# Patient Record
Sex: Male | Born: 1960 | Race: White | Hispanic: No | Marital: Married | Smoking: Former smoker
Health system: Southern US, Community
[De-identification: ages and names within clinical notes are randomized; demographics above are authoritative.]

## PROBLEM LIST (undated history)

## (undated) DIAGNOSIS — I10 Essential (primary) hypertension: Secondary | ICD-10-CM

## (undated) DIAGNOSIS — F419 Anxiety disorder, unspecified: Secondary | ICD-10-CM

## (undated) DIAGNOSIS — K209 Esophagitis, unspecified without bleeding: Secondary | ICD-10-CM

## (undated) DIAGNOSIS — Z8679 Personal history of other diseases of the circulatory system: Secondary | ICD-10-CM

## (undated) DIAGNOSIS — M109 Gout, unspecified: Secondary | ICD-10-CM

## (undated) DIAGNOSIS — C50929 Malignant neoplasm of unspecified site of unspecified male breast: Secondary | ICD-10-CM

## (undated) DIAGNOSIS — I2510 Atherosclerotic heart disease of native coronary artery without angina pectoris: Secondary | ICD-10-CM

## (undated) DIAGNOSIS — I700 Atherosclerosis of aorta: Secondary | ICD-10-CM

## (undated) DIAGNOSIS — N522 Drug-induced erectile dysfunction: Secondary | ICD-10-CM

## (undated) HISTORY — DX: Gout, unspecified: M10.9

## (undated) HISTORY — DX: Esophagitis, unspecified without bleeding: K20.90

## (undated) HISTORY — DX: Essential (primary) hypertension: I10

## (undated) HISTORY — DX: Personal history of other diseases of the circulatory system: Z86.79

## (undated) HISTORY — PX: KNEE SURGERY: SHX244

## (undated) HISTORY — DX: Esophagitis, unspecified: K20.9

---

## 1995-02-19 HISTORY — PX: SPINE SURGERY: SHX786

## 1995-02-19 HISTORY — PX: BACK SURGERY: SHX140

## 2007-03-11 ENCOUNTER — Other Ambulatory Visit: Payer: Self-pay

## 2007-03-11 ENCOUNTER — Emergency Department: Payer: Self-pay | Admitting: Emergency Medicine

## 2007-03-16 ENCOUNTER — Other Ambulatory Visit: Payer: Self-pay

## 2007-03-16 ENCOUNTER — Inpatient Hospital Stay: Payer: Self-pay | Admitting: Internal Medicine

## 2009-06-16 ENCOUNTER — Emergency Department: Payer: Self-pay | Admitting: Unknown Physician Specialty

## 2012-02-19 HISTORY — PX: KNEE SURGERY: SHX244

## 2012-04-05 ENCOUNTER — Ambulatory Visit: Payer: Self-pay | Admitting: Family Medicine

## 2012-04-05 LAB — RAPID INFLUENZA A&B ANTIGENS

## 2012-05-01 ENCOUNTER — Ambulatory Visit: Payer: Self-pay | Admitting: Family Medicine

## 2012-05-08 ENCOUNTER — Ambulatory Visit: Payer: Self-pay | Admitting: Family Medicine

## 2014-10-09 ENCOUNTER — Other Ambulatory Visit: Payer: Self-pay | Admitting: Family Medicine

## 2014-10-19 ENCOUNTER — Other Ambulatory Visit: Payer: Self-pay | Admitting: Family Medicine

## 2014-11-08 ENCOUNTER — Other Ambulatory Visit: Payer: Self-pay | Admitting: Family Medicine

## 2014-11-15 ENCOUNTER — Other Ambulatory Visit: Payer: Self-pay | Admitting: Unknown Physician Specialty

## 2014-11-15 NOTE — Telephone Encounter (Signed)
Your patient.  Thanks 

## 2014-11-15 NOTE — Telephone Encounter (Signed)
Call for an apt plz

## 2014-11-18 ENCOUNTER — Other Ambulatory Visit: Payer: Self-pay | Admitting: Family Medicine

## 2014-11-22 ENCOUNTER — Other Ambulatory Visit: Payer: Self-pay | Admitting: Family Medicine

## 2015-01-03 ENCOUNTER — Other Ambulatory Visit: Payer: Self-pay | Admitting: Family Medicine

## 2015-03-05 ENCOUNTER — Other Ambulatory Visit: Payer: Self-pay | Admitting: Family Medicine

## 2015-03-15 ENCOUNTER — Telehealth: Payer: Self-pay | Admitting: Family Medicine

## 2015-03-15 MED ORDER — MELOXICAM 15 MG PO TABS: 15.0000 mg | ORAL_TABLET | Freq: Every day | ORAL | Status: DC

## 2015-03-15 MED ORDER — OMEPRAZOLE 20 MG PO CPDR
20.0000 mg | DELAYED_RELEASE_CAPSULE | Freq: Every day | ORAL | Status: DC
Start: 2015-03-15 — End: 2015-04-04

## 2015-03-15 NOTE — Telephone Encounter (Signed)
Pt called and made appt for 04/04/15 @ 1330.  Can we call in enough medication until that appt?  It's the soonest I could get him in with Dr Jeananne Rama.

## 2015-03-15 NOTE — Telephone Encounter (Signed)
Per his wife, He needs Omeprazole 20mg  Cap QD and Meloxicam 15mg  QD  I told her the Allopurinol should have refills, written in August with 7 refills [facility]

## 2015-03-15 NOTE — Telephone Encounter (Signed)
What does he need?

## 2015-03-15 NOTE — Telephone Encounter (Signed)
Rxs sent to his pharmacy.  

## 2015-03-31 DIAGNOSIS — I10 Essential (primary) hypertension: Secondary | ICD-10-CM | POA: Insufficient documentation

## 2015-04-04 ENCOUNTER — Encounter: Payer: Self-pay | Admitting: Family Medicine

## 2015-04-04 ENCOUNTER — Ambulatory Visit (INDEPENDENT_AMBULATORY_CARE_PROVIDER_SITE_OTHER): Payer: BLUE CROSS/BLUE SHIELD | Admitting: Family Medicine

## 2015-04-04 VITALS — BP 123/75 | HR 68 | Temp 98.2°F | Ht 71.6 in | Wt 232.0 lb

## 2015-04-04 DIAGNOSIS — M109 Gout, unspecified: Secondary | ICD-10-CM | POA: Insufficient documentation

## 2015-04-04 DIAGNOSIS — R0689 Other abnormalities of breathing: Secondary | ICD-10-CM | POA: Diagnosis not present

## 2015-04-04 DIAGNOSIS — M1000 Idiopathic gout, unspecified site: Secondary | ICD-10-CM

## 2015-04-04 DIAGNOSIS — I10 Essential (primary) hypertension: Secondary | ICD-10-CM | POA: Diagnosis not present

## 2015-04-04 MED ORDER — ALLOPURINOL 300 MG PO TABS: ORAL_TABLET | ORAL | Status: DC

## 2015-04-04 MED ORDER — OMEPRAZOLE 20 MG PO CPDR: 20.0000 mg | DELAYED_RELEASE_CAPSULE | Freq: Every day | ORAL | Status: DC

## 2015-04-04 MED ORDER — MELOXICAM 15 MG PO TABS: 15.0000 mg | ORAL_TABLET | Freq: Every day | ORAL | Status: DC

## 2015-04-04 MED ORDER — TRIAMCINOLONE ACETONIDE 55 MCG/ACT NA AERO: 2.0000 | INHALATION_SPRAY | Freq: Every day | NASAL | Status: DC

## 2015-04-04 MED ORDER — LISINOPRIL 20 MG PO TABS: 20.0000 mg | ORAL_TABLET | Freq: Every day | ORAL | Status: DC

## 2015-04-04 NOTE — Progress Notes (Signed)
   BP 123/75 mmHg  Pulse 68  Temp(Src) 98.2 F (36.8 C)  Ht 5' 11.6" (1.819 m)  Wt 232 lb (105.235 kg)  BMI 31.80 kg/m2  SpO2 95%   Subjective:    Patient ID: Kurt Mendoza, male    DOB: 1960-07-13, 55 y.o.   MRN: [id number]  HPI: Kurt Mendoza is a 55 y.o. male  Chief Complaint  Patient presents with  . Hypertension  . medication refills   patient doing well with good blood pressure no complaints taking lisinopril without problems and faithfully No gout symptoms takes allopurinol without problems Takes meloxicam every day Takes omeprazole every day for reflux which is stable  Also nose gets stopped up especially laying on his right side and can't breathe and has limited air passage on the right side  Relevant past medical, surgical, family and social history reviewed and updated as indicated. Interim medical history since our last visit reviewed. Allergies and medications reviewed and updated.  Review of Systems  Constitutional: Negative.   Respiratory: Negative.   Cardiovascular: Negative.     Per HPI unless specifically indicated above     Objective:    BP 123/75 mmHg  Pulse 68  Temp(Src) 98.2 F (36.8 C)  Ht 5' 11.6" (1.819 m)  Wt 232 lb (105.235 kg)  BMI 31.80 kg/m2  SpO2 95%  Wt Readings from Last 3 Encounters:  04/04/15 232 lb (105.235 kg)  06/09/14 234 lb (106.142 kg)    Physical Exam  Constitutional: He is oriented to person, place, and time. He appears well-developed and well-nourished. No distress.  HENT:  Head: Normocephalic and atraumatic.  Right Ear: Hearing normal.  Left Ear: Hearing normal.  Nose: Nose normal.  No obvious lesion and nose  Eyes: Conjunctivae and lids are normal. Right eye exhibits no discharge. Left eye exhibits no discharge. No scleral icterus.  Cardiovascular: Normal rate, regular rhythm and normal heart sounds.   Pulmonary/Chest: Effort normal and breath sounds normal. No respiratory distress.  Musculoskeletal: Normal  range of motion.  Neurological: He is alert and oriented to person, place, and time.  Skin: Skin is intact. No rash noted.  Psychiatric: He has a normal mood and affect. His speech is normal and behavior is normal. Judgment and thought content normal. Cognition and memory are normal.        Assessment & Plan:   Problem List Items Addressed This Visit      Cardiovascular and Mediastinum   Hypertension - Primary    The current medical regimen is effective;  continue present plan and medications.       Relevant Medications   lisinopril (PRINIVIL,ZESTRIL) 20 MG tablet   Other Relevant Orders   Basic metabolic panel     Other   Gout    Stable gout on allopurinol Discussed holding meloxicam from time to time      Relevant Orders   Uric acid   Difficulty breathing    Will try Nasacort if no relief refer to ear nose and throat to further evaluate symptoms may be caused by nasal polyp.          Follow up plan: Return in about 3 months (around 07/02/2015) for Physical Exam.

## 2015-04-04 NOTE — Assessment & Plan Note (Signed)
Will try Nasacort if no relief refer to ear nose and throat to further evaluate symptoms may be caused by nasal polyp.

## 2015-04-04 NOTE — Assessment & Plan Note (Addendum)
Stable gout on allopurinol Discussed holding meloxicam from time to time

## 2015-04-04 NOTE — Assessment & Plan Note (Signed)
The current medical regimen is effective;  continue present plan and medications.  

## 2015-04-05 ENCOUNTER — Encounter: Payer: Self-pay | Admitting: Family Medicine

## 2015-04-05 LAB — URIC ACID: URIC ACID: 5.3 mg/dL (ref 3.7–8.6)

## 2015-04-05 LAB — BASIC METABOLIC PANEL
BUN / CREAT RATIO: 26 — AB (ref 9–20)
BUN: 20 mg/dL (ref 6–24)
CO2: 23 mmol/L (ref 18–29)
CREATININE: 0.76 mg/dL (ref 0.76–1.27)
Calcium: 9.4 mg/dL (ref 8.7–10.2)
Chloride: 102 mmol/L (ref 96–106)
GFR calc Af Amer: 119 mL/min/{1.73_m2} (ref >59)
GFR calc non Af Amer: 103 mL/min/{1.73_m2} (ref >59)
GLUCOSE: 86 mg/dL (ref 65–99)
Potassium: 4.3 mmol/L (ref 3.5–5.2)
Sodium: 143 mmol/L (ref 134–144)

## 2015-04-18 ENCOUNTER — Telehealth: Payer: Self-pay | Admitting: Family Medicine

## 2015-04-18 MED ORDER — OSELTAMIVIR PHOSPHATE 75 MG PO CAPS: 75.0000 mg | ORAL_CAPSULE | Freq: Every day | ORAL | Status: DC

## 2015-04-18 NOTE — Telephone Encounter (Signed)
Wife tested positive for influenza B. Would like prophylactic tamiflu sent to the pharmacy. Rx sent to the pharmacy.

## 2015-05-15 ENCOUNTER — Ambulatory Visit (INDEPENDENT_AMBULATORY_CARE_PROVIDER_SITE_OTHER): Payer: BLUE CROSS/BLUE SHIELD | Admitting: Family Medicine

## 2015-05-15 ENCOUNTER — Encounter: Payer: Self-pay | Admitting: Family Medicine

## 2015-05-15 VITALS — BP 132/85 | HR 74 | Temp 97.8°F | Ht 71.7 in | Wt 220.0 lb

## 2015-05-15 DIAGNOSIS — R55 Syncope and collapse: Secondary | ICD-10-CM

## 2015-05-15 NOTE — Progress Notes (Signed)
   BP 132/85 mmHg  Pulse 74  Temp(Src) 97.8 F (36.6 C)  Ht 5' 11.7" (1.821 m)  Wt 220 lb (99.791 kg)  BMI 30.09 kg/m2  SpO2 96%   Subjective:    Patient ID: Kurt Mendoza, male    DOB: 02-16-1961, 55 y.o.   MRN: [id number]  HPI: Kurt Mendoza is a 55 y.o. male  Chief Complaint  Patient presents with  . light headed at work last week    needs note to go back to work   Patient with near syncopal spell with full workup done at [facility] emergency room Patient recovered and feels back to normal ready to go to work tomorrow Reviewed ER notes and labs which were normal. Relevant past medical, surgical, family and social history reviewed and updated as indicated. Interim medical history since our last visit reviewed. Allergies and medications reviewed and updated.  Review of Systems  Constitutional: Negative.   Respiratory: Negative.   Cardiovascular: Negative.     Per HPI unless specifically indicated above     Objective:    BP 132/85 mmHg  Pulse 74  Temp(Src) 97.8 F (36.6 C)  Ht 5' 11.7" (1.821 m)  Wt 220 lb (99.791 kg)  BMI 30.09 kg/m2  SpO2 96%  Wt Readings from Last 3 Encounters:  05/15/15 220 lb (99.791 kg)  04/04/15 232 lb (105.235 kg)  06/09/14 234 lb (106.142 kg)    Physical Exam  Constitutional: He is oriented to person, place, and time. He appears well-developed and well-nourished. No distress.  HENT:  Head: Normocephalic and atraumatic.  Right Ear: Hearing normal.  Left Ear: Hearing normal.  Nose: Nose normal.  Eyes: Conjunctivae and lids are normal. Right eye exhibits no discharge. Left eye exhibits no discharge. No scleral icterus.  Cardiovascular: Normal rate, regular rhythm and normal heart sounds.   Pulmonary/Chest: Effort normal and breath sounds normal. No respiratory distress.  Musculoskeletal: Normal range of motion.  Neurological: He is alert and oriented to person, place, and time.  Skin: Skin is intact. No rash noted.  Psychiatric: He has a  normal mood and affect. His speech is normal and behavior is normal. Judgment and thought content normal. Cognition and memory are normal.    Results for orders placed or performed in visit on [id number]  Basic metabolic panel  Result Value Ref Range   Glucose 86 65 - 99 mg/dL   BUN 20 6 - 24 mg/dL   Creatinine, Ser 0.76 0.76 - 1.27 mg/dL   GFR calc non Af Amer 103 >59 mL/min/1.73   GFR calc Af Amer 119 >59 mL/min/1.73   BUN/Creatinine Ratio 26 (H) 9 - 20   Sodium 143 134 - 144 mmol/L   Potassium 4.3 3.5 - 5.2 mmol/L   Chloride 102 96 - 106 mmol/L   CO2 23 18 - 29 mmol/L   Calcium 9.4 8.7 - 10.2 mg/dL  Uric acid  Result Value Ref Range   Uric Acid 5.3 3.7 - 8.6 mg/dL      Assessment & Plan:   Problem List Items Addressed This Visit    None    Visit Diagnoses    Near syncope    -  Primary    Discuss near-syncope hydration food and self-care status for prevention        Follow up plan: Return for As scheduled.

## 2015-07-04 ENCOUNTER — Encounter: Payer: Self-pay | Admitting: Family Medicine

## 2015-07-31 ENCOUNTER — Emergency Department
Admission: EM | Admit: 2015-07-31 | Discharge: 2015-07-31 | Disposition: A | Discharge status: Home / Self Care | Payer: Self-pay | Attending: Emergency Medicine | Admitting: Emergency Medicine

## 2015-07-31 ENCOUNTER — Emergency Department: Payer: Self-pay

## 2015-07-31 ENCOUNTER — Encounter: Payer: Self-pay | Admitting: Emergency Medicine

## 2015-07-31 ENCOUNTER — Other Ambulatory Visit: Payer: Self-pay

## 2015-07-31 DIAGNOSIS — I10 Essential (primary) hypertension: Secondary | ICD-10-CM | POA: Insufficient documentation

## 2015-07-31 DIAGNOSIS — R0789 Other chest pain: Secondary | ICD-10-CM | POA: Insufficient documentation

## 2015-07-31 DIAGNOSIS — Z791 Long term (current) use of non-steroidal anti-inflammatories (NSAID): Secondary | ICD-10-CM | POA: Insufficient documentation

## 2015-07-31 DIAGNOSIS — Z79899 Other long term (current) drug therapy: Secondary | ICD-10-CM | POA: Insufficient documentation

## 2015-07-31 LAB — BASIC METABOLIC PANEL
Anion gap: 10 (ref 5–15)
BUN: 16 mg/dL (ref 6–20)
CO2: 22 mmol/L (ref 22–32)
CREATININE: 0.68 mg/dL (ref 0.61–1.24)
Calcium: 9.3 mg/dL (ref 8.9–10.3)
Chloride: 105 mmol/L (ref 101–111)
GFR calc Af Amer: >60 mL/min (ref >60)
Glucose, Bld: 107 mg/dL — ABNORMAL HIGH (ref 65–99)
POTASSIUM: 3.7 mmol/L (ref 3.5–5.1)
SODIUM: 137 mmol/L (ref 135–145)

## 2015-07-31 LAB — CBC
HEMATOCRIT: 46.1 % (ref 40.0–52.0)
Hemoglobin: 16 g/dL (ref 13.0–18.0)
MCH: 31.3 pg (ref 26.0–34.0)
MCHC: 34.7 g/dL (ref 32.0–36.0)
MCV: 90.3 fL (ref 80.0–100.0)
PLATELETS: 186 10*3/uL (ref 150–440)
RBC: 5.11 MIL/uL (ref 4.40–5.90)
RDW: 12.2 % (ref 11.5–14.5)
WBC: 7.1 10*3/uL (ref 3.8–10.6)

## 2015-07-31 LAB — TROPONIN I: Troponin I: <0.03 ng/mL (ref <0.031)

## 2015-07-31 NOTE — Discharge Instructions (Signed)

## 2015-07-31 NOTE — ED Notes (Signed)
Says tightness/pressure low chest at 9am started at rest.  Nausea and numbness in arms .

## 2015-07-31 NOTE — ED Provider Notes (Signed)
[facility] Emergency Department Provider Note  ____________________________________________  Time seen: Approximately 4:49 PM  I have reviewed the triage vital signs and the nursing notes.   HISTORY  Chief Complaint Chest Pain; Nausea; and Numbness    HPI Kurt Mendoza is a 55 y.o. male with a past medical history of hypertension and esophagitis who presents for evaluation of several days of general malaise and lightheadedness and gradual onset of mild central chest tightness and pressure in the lower part of his chest/epigastrium.  This started about 9 AM today and has completely resolved.  He does have issues with acid reflux and takes a medication "about every other day" he cannot remember which one.  He denies fever/chills, shortness of breath, sharp chest pain, abdominal pain, dysuria.  Nothing in particularmakes it better nor worse.  He has no close family members that have ever had problems with her heart, he does not have diabetes he smokes occasional cigars but no cigarettes, and he has no personal history of acute coronary syndrome/CAD.  He had an ultrasound of his right upper quadrant years ago and they told him he has fatty liver disease but he had a normal gallbladder at the time.   Past Medical History  Diagnosis Date  . Hypertension   . Gout   . Esophagitis   . History of irregular heartbeat     Patient Active Problem List   Diagnosis Date Noted  . Difficulty breathing 04/04/2015  . Gout 04/04/2015  . Hypertension     Past Surgical History  Procedure Laterality Date  . Spine surgery  1997    Current Outpatient Rx  Name  Route  Sig  Dispense  Refill  . allopurinol (ZYLOPRIM) 300 MG tablet      TAKE 1 TABLET BY MOUTH DAILY   30 tablet   7   . colchicine 0.6 MG tablet      TAKE 2 TABLETS BY MOUTH NOW THEN 1 TABLET 1 HOUR LATER THEN 1 TWICE DAILY FOR GOUT ATTACK   60 tablet   0   . lisinopril (PRINIVIL,ZESTRIL) 20 MG  tablet   Oral   Take 1 tablet (20 mg total) by mouth daily.   30 tablet   7   . meloxicam (MOBIC) 15 MG tablet   Oral   Take 1 tablet (15 mg total) by mouth daily.   30 tablet   7   . omeprazole (PRILOSEC) 20 MG capsule   Oral   Take 1 capsule (20 mg total) by mouth daily.   30 capsule   7   . triamcinolone (NASACORT) 55 MCG/ACT AERO nasal inhaler   Nasal   Place 2 sprays into the nose daily.   1 Inhaler   12     Allergies Amlodipine and Morphine and related  Family History  Problem Relation Age of Onset  . Hyperlipidemia Mother   . Stroke Father   . Hyperlipidemia Father   . Diabetes Father   . Heart disease Father   . Thyroid disease Sister   . Thyroid disease Brother     Social History Social History  Substance Use Topics  . Smoking status: Never Smoker   . Smokeless tobacco: Never Used  . Alcohol Use: Yes    Review of Systems Constitutional: No fever/chills Eyes: No visual changes. ENT: No sore throat. Cardiovascular: Denies chest pain.  Mild chest pressure, resolved. Respiratory: Denies shortness of breath. Gastrointestinal: No abdominal pain.  No nausea, no vomiting.  No diarrhea.  No constipation. Genitourinary: Negative for dysuria. Musculoskeletal: Negative for back pain. Skin: Negative for rash. Neurological: Negative for headaches, focal weakness or numbness.  10-point ROS otherwise negative.  ____________________________________________   PHYSICAL EXAM:  VITAL SIGNS: ED Triage Vitals  Enc Vitals Group     BP 07/31/15 1232 138/90 mmHg     Pulse Rate 07/31/15 1232 80     Resp 07/31/15 1232 16     Temp 07/31/15 1232 97.6 F (36.4 C)     Temp Source 07/31/15 1232 Oral     SpO2 07/31/15 1232 96 %     Weight 07/31/15 1232 225 lb (102.059 kg)     Height 07/31/15 1232 6\' 1"  (1.854 m)     Head Cir --      Peak Flow --      Pain Score 07/31/15 1233 5     Pain Loc --      Pain Edu? --      Excl. in [place]? --     Constitutional:  Alert and oriented. Well appearing and in no acute distress. Eyes: Conjunctivae are normal. PERRL. EOMI. Head: Atraumatic. Nose: No congestion/rhinnorhea. Mouth/Throat: Mucous membranes are moist.  Oropharynx non-erythematous. Neck: No stridor.  No meningeal signs.   Cardiovascular: Normal rate, regular rhythm. Good peripheral circulation. Grossly normal heart sounds.   Respiratory: Normal respiratory effort.  No retractions. Lungs CTAB. Gastrointestinal: Soft and nontender. No distention.  Musculoskeletal: No lower extremity tenderness nor edema. No gross deformities of extremities. Neurologic:  Normal speech and language. No gross focal neurologic deficits are appreciated.  Skin:  Skin is warm, dry and intact. No rash noted. Psychiatric: Mood and affect are normal. Speech and behavior are normal.  ____________________________________________   LABS (all labs ordered are listed, but only abnormal results are displayed)  Labs Reviewed  BASIC METABOLIC PANEL - Abnormal; Notable for the following:    Glucose, Bld 107 (*)    All other components within normal limits  CBC  TROPONIN I   ____________________________________________  EKG  ED ECG REPORT I, Ritu Gagliardo, the attending physician, personally viewed and interpreted this ECG.  Date: 07/31/2015 EKG Time: 12:21 Rate: 75 Rhythm: normal sinus rhythm QRS Axis: normal Intervals: normal.  LVH ST/T Wave abnormalities: normal Conduction Disturbances: none Narrative Interpretation: unremarkable  ____________________________________________  RADIOLOGY   Dg Chest 2 View  07/31/2015  CLINICAL DATA:  Low chest tightness and pressure starting at 9 a.m. today. Nausea and numbness arms. EXAM: CHEST  2 VIEW COMPARISON:  03/16/2007 FINDINGS: The lungs are clear wiithout focal pneumonia, edema, pneumothorax or pleural effusion. The cardiopericardial silhouette is within normal limits for size. The visualized bony structures of the  thorax are intact. IMPRESSION: No active cardiopulmonary disease. Electronically Signed   By: [name] M.D.   On: 07/31/2015 12:58    ____________________________________________   PROCEDURES  Procedure(s) performed: None  Critical Care performed: No ____________________________________________   INITIAL IMPRESSION / ASSESSMENT AND PLAN / ED COURSE  Pertinent labs & imaging results that were available during my care of the patient were reviewed by me and considered in my medical decision making (see chart for details).  HEART score of 2 (low risk). PERC negative.  The patient does not want to stay for further evaluation because he has been waiting in the emergency department for more than 4-1/2 hours.  Given that he is asymptomatic and low risk, I think this is appropriate.  I gave him my usual and customary low risk  chest pain instructions and return precautions and he will follow-up with his primary care doctor.  He is declining a full dose aspirin in the emergency department today.   ____________________________________________  FINAL CLINICAL IMPRESSION(S) / ED DIAGNOSES  Final diagnoses:  Atypical chest pain     MEDICATIONS GIVEN DURING THIS VISIT:  Medications - No data to display   NEW OUTPATIENT MEDICATIONS STARTED DURING THIS VISIT:  New Prescriptions   No medications on file      Note:  This document was prepared using Dragon voice recognition software and may include unintentional dictation errors.   Hinda Kehr, MD 07/31/15 1705

## 2015-11-17 ENCOUNTER — Encounter (INDEPENDENT_AMBULATORY_CARE_PROVIDER_SITE_OTHER): Payer: Self-pay

## 2015-12-13 ENCOUNTER — Encounter: Payer: Self-pay | Admitting: Family Medicine

## 2016-02-07 ENCOUNTER — Encounter: Payer: Self-pay | Admitting: Family Medicine

## 2016-02-07 ENCOUNTER — Ambulatory Visit (INDEPENDENT_AMBULATORY_CARE_PROVIDER_SITE_OTHER): Payer: Self-pay | Admitting: Family Medicine

## 2016-02-07 ENCOUNTER — Ambulatory Visit
Admission: RE | Admit: 2016-02-07 | Discharge: 2016-02-07 | Disposition: A | Discharge status: Home / Self Care | Payer: Self-pay | Source: Ambulatory Visit | Attending: Family Medicine | Admitting: Family Medicine

## 2016-02-07 ENCOUNTER — Telehealth: Payer: Self-pay | Admitting: Family Medicine

## 2016-02-07 VITALS — BP 132/81 | HR 85 | Temp 97.9°F | Wt 233.4 lb

## 2016-02-07 DIAGNOSIS — R0689 Other abnormalities of breathing: Secondary | ICD-10-CM

## 2016-02-07 DIAGNOSIS — J019 Acute sinusitis, unspecified: Secondary | ICD-10-CM

## 2016-02-07 DIAGNOSIS — I10 Essential (primary) hypertension: Secondary | ICD-10-CM

## 2016-02-07 MED ORDER — BENZONATATE 100 MG PO CAPS: 100.0000 mg | ORAL_CAPSULE | Freq: Two times a day (BID) | ORAL | 0 refills | Status: DC | PRN

## 2016-02-07 MED ORDER — AMOXICILLIN-POT CLAVULANATE 875-125 MG PO TABS: 1.0000 | ORAL_TABLET | Freq: Two times a day (BID) | ORAL | 0 refills | Status: DC

## 2016-02-07 NOTE — Assessment & Plan Note (Signed)
The current medical regimen is effective;  continue present plan and medications.  

## 2016-02-07 NOTE — Progress Notes (Signed)
BP 132/81 (BP Location: Left Arm, Patient Position: Sitting, Cuff Size: Normal)   Pulse 85   Temp 97.9 F (36.6 C) (Oral)   Wt 233 lb 6.4 oz (105.9 kg)   SpO2 94%   BMI 30.79 kg/m    Subjective:    Patient ID: Kurt Mendoza, male    DOB: [birth year], 56 y.o.   MRN: [id number]  HPI: Kurt Mendoza is a 55 y.o. male  Chief Complaint  Patient presents with  . URI    X 3 days  Patient with a lot of sinus pressure congestion feeling bad getting worse over the last 3 days coughing a lot running some fever and some left chest area discomfort. Sinus pressure is worse with bending and some sloshing-type sensation in his sinuses.  Relevant past medical, surgical, family and social history reviewed and updated as indicated. Interim medical history since our last visit reviewed. Allergies and medications reviewed and updated.  Review of Systems  Constitutional: Positive for chills, diaphoresis, fatigue and fever.  HENT: Positive for congestion, postnasal drip, rhinorrhea, sinus pain, sinus pressure and sore throat.   Respiratory: Positive for cough and shortness of breath. Negative for stridor.   Cardiovascular: Negative.     Per HPI unless specifically indicated above     Objective:    BP 132/81 (BP Location: Left Arm, Patient Position: Sitting, Cuff Size: Normal)   Pulse 85   Temp 97.9 F (36.6 C) (Oral)   Wt 233 lb 6.4 oz (105.9 kg)   SpO2 94%   BMI 30.79 kg/m   Wt Readings from Last 3 Encounters:  02/07/16 233 lb 6.4 oz (105.9 kg)  07/31/15 225 lb (102.1 kg)  05/15/15 220 lb (99.8 kg)    Physical Exam  Constitutional: He is oriented to person, place, and time. He appears well-developed and well-nourished. No distress.  HENT:  Head: Normocephalic and atraumatic.  Right Ear: Hearing normal.  Left Ear: Hearing normal.  Nose: Nose normal.  Eyes: Conjunctivae and lids are normal. Right eye exhibits no discharge. Left eye exhibits no discharge. No scleral icterus.    Cardiovascular: Normal rate, regular rhythm and normal heart sounds.   Pulmonary/Chest: Effort normal. No respiratory distress.  rhonchi left lower lobe  CXR normal   Musculoskeletal: Normal range of motion.  Neurological: He is alert and oriented to person, place, and time.  Skin: Skin is intact. No rash noted.  Psychiatric: He has a normal mood and affect. His speech is normal and behavior is normal. Judgment and thought content normal. Cognition and memory are normal.    Results for orders placed or performed during the hospital encounter of [id number]  Basic metabolic panel  Result Value Ref Range   Sodium 137 135 - 145 mmol/L   Potassium 3.7 3.5 - 5.1 mmol/L   Chloride 105 101 - 111 mmol/L   CO2 22 22 - 32 mmol/L   Glucose, Bld 107 (H) 65 - 99 mg/dL   BUN 16 6 - 20 mg/dL   Creatinine, Ser 0.68 0.61 - 1.24 mg/dL   Calcium 9.3 8.9 - 10.3 mg/dL   GFR calc non Af Amer >60 >60 mL/min   GFR calc Af Amer >60 >60 mL/min   Anion gap 10 5 - 15  CBC  Result Value Ref Range   WBC 7.1 3.8 - 10.6 K/uL   RBC 5.11 4.40 - 5.90 MIL/uL   Hemoglobin 16.0 13.0 - 18.0 g/dL   HCT 46.1 40.0 - 52.0 %   MCV  90.3 80.0 - 100.0 fL   MCH 31.3 26.0 - 34.0 pg   MCHC 34.7 32.0 - 36.0 g/dL   RDW 12.2 11.5 - 14.5 %   Platelets 186 150 - 440 K/uL  Troponin I  Result Value Ref Range   Troponin I <0.03 <0.031 ng/mL      Assessment & Plan:   Problem List Items Addressed This Visit      Cardiovascular and Mediastinum   Hypertension    The current medical regimen is effective;  continue present plan and medications.         Other   Difficulty breathing - Primary   Relevant Orders   DG Chest 2 View (Completed)    Other Visit Diagnoses    Acute sinusitis, recurrence not specified, unspecified location       Discussed patient will use Mucinex Tylenol sinus Augmentin and Tessalon Perles discuss use of medications   Relevant Medications   amoxicillin-clavulanate (AUGMENTIN) 875-125 MG tablet    benzonatate (TESSALON) 100 MG capsule       Follow up plan: Return for Physical Exam.

## 2016-02-07 NOTE — Telephone Encounter (Signed)
Phone call

## 2016-02-07 NOTE — Telephone Encounter (Signed)
Per Rocco Serene -- Patient had no acute cardiopulmonary disease per Dr Jobe Igo

## 2016-02-07 NOTE — Progress Notes (Incomplete)
   BP 132/81 (BP Location: Left Arm, Patient Position: Sitting, Cuff Size: Normal)   Pulse 85   Temp 97.9 F (36.6 C) (Oral)   Wt 233 lb 6.4 oz (105.9 kg)   SpO2 94%   BMI 30.79 kg/m    Subjective:    Patient ID: Kurt Mendoza, male    DOB: [birth year], 55 y.o.   MRN: [id number]  HPI: Kurt Mendoza is a 55 y.o. male  Chief Complaint  Patient presents with  . URI    X 3 days  Patient with a lot of sinus pressure congestion feeling bad getting worse over the last 3 days coughing a lot running some fever and some left chest area discomfort. Sinus pressure is worse with bending and some sloshing-type sensation in his sinuses.  Relevant past medical, surgical, family and social history reviewed and updated as indicated. Interim medical history since our last visit reviewed. Allergies and medications reviewed and updated.  Review of Systems  Constitutional: Positive for chills, diaphoresis, fatigue and fever.  HENT: Positive for congestion.     Per HPI unless specifically indicated above     Objective:    BP 132/81 (BP Location: Left Arm, Patient Position: Sitting, Cuff Size: Normal)   Pulse 85   Temp 97.9 F (36.6 C) (Oral)   Wt 233 lb 6.4 oz (105.9 kg)   SpO2 94%   BMI 30.79 kg/m   Wt Readings from Last 3 Encounters:  02/07/16 233 lb 6.4 oz (105.9 kg)  07/31/15 225 lb (102.1 kg)  05/15/15 220 lb (99.8 kg)    Physical Exam  Results for orders placed or performed during the hospital encounter of [id number]  Basic metabolic panel  Result Value Ref Range   Sodium 137 135 - 145 mmol/L   Potassium 3.7 3.5 - 5.1 mmol/L   Chloride 105 101 - 111 mmol/L   CO2 22 22 - 32 mmol/L   Glucose, Bld 107 (H) 65 - 99 mg/dL   BUN 16 6 - 20 mg/dL   Creatinine, Ser 0.68 0.61 - 1.24 mg/dL   Calcium 9.3 8.9 - 10.3 mg/dL   GFR calc non Af Amer >60 >60 mL/min   GFR calc Af Amer >60 >60 mL/min   Anion gap 10 5 - 15  CBC  Result Value Ref Range   WBC 7.1 3.8 - 10.6 K/uL   RBC 5.11 4.40  - 5.90 MIL/uL   Hemoglobin 16.0 13.0 - 18.0 g/dL   HCT 46.1 40.0 - 52.0 %   MCV 90.3 80.0 - 100.0 fL   MCH 31.3 26.0 - 34.0 pg   MCHC 34.7 32.0 - 36.0 g/dL   RDW 12.2 11.5 - 14.5 %   Platelets 186 150 - 440 K/uL  Troponin I  Result Value Ref Range   Troponin I <0.03 <0.031 ng/mL      Assessment & Plan:   Problem List Items Addressed This Visit    None       Follow up plan: No Follow-up on file.

## 2016-02-14 ENCOUNTER — Telehealth: Payer: Self-pay | Admitting: Family Medicine

## 2016-02-14 ENCOUNTER — Encounter: Payer: Self-pay | Admitting: Family Medicine

## 2016-02-14 NOTE — Telephone Encounter (Signed)
Patient called stating he needs a note in order to return to work tomorrow.  He needs this today.  Thank You  Santiago Glad

## 2016-02-14 NOTE — Telephone Encounter (Signed)
Routing to provider  

## 2016-02-14 NOTE — Telephone Encounter (Signed)
Pt called and stated that he is feeling better and would like to get a note to go back to work tonight.

## 2016-02-14 NOTE — Telephone Encounter (Signed)
Letter up front for him to pick up

## 2016-02-14 NOTE — Telephone Encounter (Signed)
Note is up front for him.

## 2016-04-25 ENCOUNTER — Other Ambulatory Visit: Payer: Self-pay | Admitting: Family Medicine

## 2016-04-25 NOTE — Telephone Encounter (Signed)
Routing to provider. No follow up on file. 

## 2017-09-02 ENCOUNTER — Other Ambulatory Visit: Payer: Self-pay | Admitting: Family Medicine

## 2017-10-30 ENCOUNTER — Other Ambulatory Visit: Payer: Self-pay

## 2017-10-30 ENCOUNTER — Encounter: Payer: Self-pay | Admitting: Emergency Medicine

## 2017-10-30 ENCOUNTER — Ambulatory Visit
Admission: EM | Admit: 2017-10-30 | Discharge: 2017-10-30 | Disposition: A | Discharge status: Home / Self Care | Payer: 59 | Attending: Family Medicine | Admitting: Family Medicine

## 2017-10-30 DIAGNOSIS — R1031 Right lower quadrant pain: Secondary | ICD-10-CM | POA: Diagnosis not present

## 2017-10-30 MED ORDER — TRAMADOL HCL 50 MG PO TABS: 50.0000 mg | ORAL_TABLET | Freq: Three times a day (TID) | ORAL | 0 refills | Status: DC | PRN

## 2017-10-30 NOTE — ED Triage Notes (Signed)
Pt c/o pain in his right side. He reports that he also sees a "lump" in the area. He feels the pain when he moves certain ways and when he touches it. When he is sitting and relaxed it does not bother him. No nausea, vomiting, diarrhea. No known injury.  He reports that he does not feel well, he feels tired. Pt BP elevated today, he states that he normally take lisinopril and he did not take it today.

## 2017-10-30 NOTE — ED Provider Notes (Signed)
[facility]    CSN: [id number] Arrival date & time: 10/30/17  1737  History   Chief Complaint Chief Complaint  Patient presents with  . Abdominal Pain   HPI  57 year old male presents with the above complaint.  Patient states that he has not felt well today.  He states that he had some shoulder pain and then noticed some soreness of his right lower abdomen.  He states that he palpated the area and felt a lump.  Worse with certain movements.  No nausea, vomiting, diarrhea.  No fevers or chills.  Feels fatigued.  No known inciting factor.  No known infectious.  No other associated symptoms.  No other complaints.  PMH, Surgical Hx, Family Hx, Social History reviewed and updated as below.  Past Medical History:  Diagnosis Date  . Esophagitis   . Gout   . History of irregular heartbeat   . Hypertension    Patient Active Problem List   Diagnosis Date Noted  . Difficulty breathing 04/04/2015  . Gout 04/04/2015  . Hypertension    Past Surgical History:  Procedure Laterality Date  . [place] Medications    Prior to Admission medications   Medication Sig Start Date End Date Taking? Authorizing Provider  colchicine 0.6 MG tablet TAKE 2 TABLETS BY MOUTH NOW THEN 1 TABLET 1 HOUR LATER THEN 1 TWICE DAILY FOR GOUT ATTACK 11/21/14  Yes Crissman, Jeannette How, MD  lisinopril (PRINIVIL,ZESTRIL) 20 MG tablet TAKE 1 TABLET BY MOUTH DAILY 09/02/17  Yes Volney American, PA-C  omeprazole (PRILOSEC) 20 MG capsule Take 1 capsule (20 mg total) by mouth daily. 04/04/15   Guadalupe Maple, MD  traMADol (ULTRAM) 50 MG tablet Take 1 tablet (50 mg total) by mouth every 8 (eight) hours as needed. 10/30/17   Coral Spikes, DO   Family History Family History  Problem Relation Age of Onset  . Hyperlipidemia Mother   . Stroke Father   . Hyperlipidemia Father   . Diabetes Father   . Heart disease Father   . Thyroid disease Sister   . Thyroid disease Brother    Social  History Social History   Tobacco Use  . Smoking status: Never Smoker  . Smokeless tobacco: Never Used  Substance Use Topics  . Alcohol use: Yes    Comment: 4-6 on the weekends  . Drug use: No   Allergies   Amlodipine and Morphine and related   Review of Systems Review of Systems  Constitutional: Positive for fatigue. Negative for chills and fever.  Gastrointestinal: Positive for abdominal pain.   Physical Exam Triage Vital Signs ED Triage Vitals  Enc Vitals Group     BP 10/30/17 1755 (!) 182/98     Pulse Rate 10/30/17 1755 91     Resp 10/30/17 1755 16     Temp 10/30/17 1755 98.7 F (37.1 C)     Temp Source 10/30/17 1755 Oral     SpO2 10/30/17 1755 97 %     Weight 10/30/17 1753 225 lb (102.1 kg)     Height 10/30/17 1753 6\' 1"  (1.854 m)     Head Circumference --      Peak Flow --      Pain Score 10/30/17 1755 2     Pain Loc --      Pain Edu? --      Excl. in [place]? --    Updated Vital Signs BP (!) 155/91 (BP Location: Left Arm)  Pulse 91   Temp 98.7 F (37.1 C) (Oral)   Resp 16   Ht 6\' 1"  (1.854 m)   Wt 102.1 kg   SpO2 97%   BMI 29.69 kg/m   Visual Acuity Right Eye Distance:   Left Eye Distance:   Bilateral Distance:    Right Eye Near:   Left Eye Near:    Bilateral Near:     Physical Exam  Constitutional: He is oriented to person, place, and time. He appears well-developed. No distress.  Cardiovascular: Normal rate and regular rhythm.  Pulmonary/Chest: Effort normal and breath sounds normal. He has no wheezes. He has no rales.  Abdominal: Soft. He exhibits no distension.  Tenderness in the RLQ; palpable firm area.  Possible spigelian hernia.  Neurological: He is alert and oriented to person, place, and time.  Psychiatric: He has a normal mood and affect. His behavior is normal.  Nursing note and vitals reviewed.  UC Treatments / Results  Labs (all labs ordered are listed, but only abnormal results are displayed) Labs Reviewed - No data to  display  EKG None  Radiology No results found.  Procedures Procedures (including critical care time)  Medications Ordered in UC Medications - No data to display  Initial Impression / Assessment and Plan / UC Course  I have reviewed the triage vital signs and the nursing notes.  Pertinent labs & imaging results that were available during my care of the patient were reviewed by me and considered in my medical decision making (see chart for details).    57 year old male presents with abdominal pain.  Does not appear to have acute abdomen.  Possible spigelian hernia.  Advised rest.  Tramadol as needed.  Offered CT scan and patient elected to forego this at this time.  Advised return if he worsens.  Close follow-up with PCP.  Final Clinical Impressions(s) / UC Diagnoses   Final diagnoses:  Right lower quadrant abdominal pain     Discharge Instructions     If persists, see [facility].  Possible Spigelian hernia.  Rest. Pain medication as needed.  If you worsen or something changes, please be re-evaluated.   Dr. Lacinda Axon    ED Prescriptions    Medication Sig Dispense Auth. Provider   traMADol (ULTRAM) 50 MG tablet Take 1 tablet (50 mg total) by mouth every 8 (eight) hours as needed. 15 tablet Coral Spikes, DO     Controlled Substance Prescriptions [place] Controlled Substance Registry consulted? Not Applicable   Coral Spikes, DO 10/30/17 1845

## 2017-10-30 NOTE — Discharge Instructions (Signed)
If persists, see [facility].  Possible Spigelian hernia.  Rest. Pain medication as needed.  If you worsen or something changes, please be re-evaluated.   Dr. Lacinda Axon

## 2017-10-31 ENCOUNTER — Other Ambulatory Visit: Payer: Self-pay | Admitting: Family Medicine

## 2017-10-31 NOTE — Telephone Encounter (Signed)
Lisinopril refill Last refill:09/02/17 #30/0 refill Last OV:02/07/16; Upcoming 12/24/17 LTE:[id number] Pharmacy: [facility] [place], [facility] [address] Shari Prows [phone] (Phone) [phone] (Fax)

## 2017-12-24 ENCOUNTER — Encounter: Payer: Self-pay | Admitting: Family Medicine

## 2018-01-11 ENCOUNTER — Other Ambulatory Visit: Payer: Self-pay | Admitting: Family Medicine

## 2018-01-11 NOTE — Telephone Encounter (Signed)
Requested medication (s) are due for refill today yes  Requested medication (s) are on the active medication list yes  Future visit scheduled yes  Has already been given courtesy refill. Routing to PCP for consideration.   Requested Prescriptions  Pending Prescriptions Disp Refills   lisinopril (PRINIVIL,ZESTRIL) 20 MG tablet [Pharmacy Med Name: LISINOPRIL 20MG  TABLETS] 30 tablet 0    Sig: TAKE 1 TABLET BY MOUTH DAILY     Cardiovascular:  ACE Inhibitors Failed - 01/11/2018  1:02 PM      Failed - Cr in normal range and within 180 days    Creatinine, Ser  Date Value Ref Range Status  07/31/2015 0.68 0.61 - 1.24 mg/dL Final         Failed - K in normal range and within 180 days    Potassium  Date Value Ref Range Status  07/31/2015 3.7 3.5 - 5.1 mmol/L Final         Failed - Last BP in normal range    BP Readings from Last 1 Encounters:  10/30/17 (!) 155/91         Failed - Valid encounter within last 6 months    Recent Outpatient Visits          1 year ago Difficulty breathing   [facility] Guadalupe Maple, MD   2 years ago Near syncope   [place], Jeannette How, MD   2 years ago Essential hypertension   [place], Jeannette How, MD      Future Appointments            In 2 months Crissman, Jeannette How, MD [place], [place] - Patient is not pregnant

## 2018-02-01 ENCOUNTER — Emergency Department: Payer: Self-pay

## 2018-02-01 ENCOUNTER — Encounter: Payer: Self-pay | Admitting: Emergency Medicine

## 2018-02-01 ENCOUNTER — Observation Stay
Admission: EM | Admit: 2018-02-01 | Discharge: 2018-02-03 | Disposition: A | Discharge status: Home / Self Care | Payer: Self-pay | Attending: General Surgery | Admitting: General Surgery

## 2018-02-01 ENCOUNTER — Other Ambulatory Visit: Payer: Self-pay

## 2018-02-01 DIAGNOSIS — Z79899 Other long term (current) drug therapy: Secondary | ICD-10-CM | POA: Insufficient documentation

## 2018-02-01 DIAGNOSIS — M109 Gout, unspecified: Secondary | ICD-10-CM | POA: Insufficient documentation

## 2018-02-01 DIAGNOSIS — K3580 Unspecified acute appendicitis: Secondary | ICD-10-CM | POA: Insufficient documentation

## 2018-02-01 DIAGNOSIS — K353 Acute appendicitis with localized peritonitis, without perforation or gangrene: Secondary | ICD-10-CM | POA: Diagnosis present

## 2018-02-01 DIAGNOSIS — I10 Essential (primary) hypertension: Secondary | ICD-10-CM | POA: Insufficient documentation

## 2018-02-01 DIAGNOSIS — K760 Fatty (change of) liver, not elsewhere classified: Secondary | ICD-10-CM | POA: Insufficient documentation

## 2018-02-01 DIAGNOSIS — Z888 Allergy status to other drugs, medicaments and biological substances status: Secondary | ICD-10-CM | POA: Insufficient documentation

## 2018-02-01 DIAGNOSIS — Z885 Allergy status to narcotic agent status: Secondary | ICD-10-CM | POA: Insufficient documentation

## 2018-02-01 DIAGNOSIS — I700 Atherosclerosis of aorta: Principal | ICD-10-CM | POA: Insufficient documentation

## 2018-02-01 LAB — COMPREHENSIVE METABOLIC PANEL
ALBUMIN: 5 g/dL (ref 3.5–5.0)
ALK PHOS: 70 U/L (ref 38–126)
ALT: 62 U/L — ABNORMAL HIGH (ref 0–44)
ANION GAP: 11 (ref 5–15)
AST: 33 U/L (ref 15–41)
BILIRUBIN TOTAL: 1.6 mg/dL — AB (ref 0.3–1.2)
BUN: 12 mg/dL (ref 6–20)
CALCIUM: 10.3 mg/dL (ref 8.9–10.3)
CO2: 22 mmol/L (ref 22–32)
Chloride: 103 mmol/L (ref 98–111)
Creatinine, Ser: 0.89 mg/dL (ref 0.61–1.24)
GFR calc Af Amer: >60 mL/min (ref >60)
GLUCOSE: 116 mg/dL — AB (ref 70–99)
POTASSIUM: 3.6 mmol/L (ref 3.5–5.1)
Sodium: 136 mmol/L (ref 135–145)
Total Protein: 8.1 g/dL (ref 6.5–8.1)

## 2018-02-01 LAB — CBC
HCT: 46.2 % (ref 39.0–52.0)
HEMOGLOBIN: 16.6 g/dL (ref 13.0–17.0)
MCH: 31.6 pg (ref 26.0–34.0)
MCHC: 35.9 g/dL (ref 30.0–36.0)
MCV: 88 fL (ref 80.0–100.0)
PLATELETS: 240 10*3/uL (ref 150–400)
RBC: 5.25 MIL/uL (ref 4.22–5.81)
RDW: 11.7 % (ref 11.5–15.5)
WBC: 15.7 10*3/uL — AB (ref 4.0–10.5)
nRBC: 0 % (ref 0.0–0.2)

## 2018-02-01 LAB — URINALYSIS, COMPLETE (UACMP) WITH MICROSCOPIC
Bacteria, UA: NONE SEEN
Bilirubin Urine: NEGATIVE
GLUCOSE, UA: NEGATIVE mg/dL
HGB URINE DIPSTICK: NEGATIVE
Ketones, ur: 20 mg/dL — AB
Leukocytes, UA: NEGATIVE
NITRITE: NEGATIVE
PH: 6 (ref 5.0–8.0)
Protein, ur: NEGATIVE mg/dL
SPECIFIC GRAVITY, URINE: 1.018 (ref 1.005–1.030)
Squamous Epithelial / LPF: NONE SEEN (ref 0–5)

## 2018-02-01 LAB — LIPASE, BLOOD: Lipase: 30 U/L (ref 11–51)

## 2018-02-01 MED ORDER — ONDANSETRON HCL 4 MG/2ML IJ SOLN
4.0000 mg | Freq: Once | INTRAMUSCULAR | Status: AC
  Administered 2018-02-01: 4 mg via INTRAVENOUS
  Filled 2018-02-01: qty 2

## 2018-02-01 MED ORDER — FENTANYL CITRATE (PF) 100 MCG/2ML IJ SOLN
50.0000 ug | Freq: Once | INTRAMUSCULAR | Status: AC
  Administered 2018-02-01: 50 ug via INTRAVENOUS
  Filled 2018-02-01: qty 2

## 2018-02-01 MED ORDER — IOHEXOL 300 MG/ML  SOLN
125.0000 mL | Freq: Once | INTRAMUSCULAR | Status: AC | PRN
  Administered 2018-02-01: 100 mL via INTRAVENOUS

## 2018-02-01 NOTE — ED Provider Notes (Signed)
[facility] Emergency Department Provider Note  ____________________________________________   First MD Initiated Contact with Patient 02/01/18 2316     (approximate)  I have reviewed the triage vital signs and the nursing notes.   HISTORY  Chief Complaint Abdominal Pain    HPI Kurt Mendoza is a 57 y.o. 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  Patient denies any vomiting.  Patient denies any fever constipation or diarrhea.  Patient denies any fever.  Patient denies any urinary symptoms.   Past Medical History:  Diagnosis Date  . Esophagitis   . Gout   . History of irregular heartbeat   . Hypertension     Patient Active Problem List   Diagnosis Date Noted  . Difficulty breathing 04/04/2015  . Gout 04/04/2015  . Hypertension     Past Surgical History:  Procedure Laterality Date  . [place]    Prior to Admission medications   Medication Sig Start Date End Date Taking? Authorizing Provider  colchicine 0.6 MG tablet TAKE 2 TABLETS BY MOUTH NOW THEN 1 TABLET 1 HOUR LATER THEN 1 TWICE DAILY FOR GOUT ATTACK 11/21/14  Yes Crissman, Jeannette How, MD  lisinopril (PRINIVIL,ZESTRIL) 20 MG tablet TAKE 1 TABLET BY MOUTH DAILY 01/12/18  Yes Crissman, Jeannette How, MD    Allergies Amlodipine and Morphine and related  Family History  Problem Relation Age of Onset  . Hyperlipidemia Mother   . Stroke Father   . Hyperlipidemia Father   . Diabetes Father   . Heart disease Father   . Thyroid disease Sister   . Thyroid disease Brother     Social History Social History   Tobacco Use  . Smoking status: Never Smoker  . Smokeless tobacco: Never Used  Substance Use Topics  . Alcohol use: Yes    Comment: 4-6 on the weekends  . Drug use: No    Review of Systems Constitutional: No  fever/chills Eyes: No visual changes. ENT: No sore throat. Cardiovascular: Denies chest pain. Respiratory: Denies shortness of breath. Gastrointestinal: Positive for abdominal pain.  No nausea, no vomiting.  No diarrhea.  No constipation. Genitourinary: Negative for dysuria. Musculoskeletal: Negative for neck pain.  Negative for back pain. Integumentary: Negative for rash. Neurological: Negative for headaches, focal weakness or numbness.   ____________________________________________   PHYSICAL EXAM:  VITAL SIGNS: ED Triage Vitals  Enc Vitals Group     BP 02/01/18 2209 (!) 138/108     Pulse Rate 02/01/18 2209 (!) 110     Resp 02/01/18 2209 20     Temp 02/01/18 2209 97.7 F (36.5 C)     Temp Source 02/01/18 2209 Oral     SpO2 02/01/18 2209 97 %     Weight 02/01/18 2210 103.4 kg (228 lb)     Height 02/01/18 2210 1.854 m (6\' 1" )     Head Circumference --      Peak Flow --      Pain Score 02/01/18 2210 9     Pain Loc --      Pain Edu? --      Excl. in [place]? --     Constitutional: Alert and oriented. Well appearing and in no acute distress. Eyes: Conjunctivae are normal. Mouth/Throat: Mucous membranes are moist.  Oropharynx non-erythematous. Neck: No stridor.   Cardiovascular: Normal rate, regular rhythm.  Good peripheral circulation. Grossly normal heart sounds. Respiratory: Normal respiratory effort.  No retractions. Lungs CTAB. Gastrointestinal: Soft and nontender. No distention.   Musculoskeletal: No lower extremity tenderness nor edema. No gross deformities of extremities. Neurologic:  Normal speech and language. No gross focal neurologic deficits are appreciated.  Skin:  Skin is warm, dry and intact. No rash noted. Psychiatric: Mood and affect are normal. Speech and behavior are normal.  ____________________________________________   LABS (all labs ordered are listed, but only abnormal results are displayed)  Labs Reviewed  COMPREHENSIVE METABOLIC PANEL -  Abnormal; Notable for the following components:      Result Value   Glucose, Bld 116 (*)    ALT 62 (*)    Total Bilirubin 1.6 (*)    All other components within normal limits  CBC - Abnormal; Notable for the following components:   WBC 15.7 (*)    All other components within normal limits  URINALYSIS, COMPLETE (UACMP) WITH MICROSCOPIC - Abnormal; Notable for the following components:   Color, Urine YELLOW (*)    APPearance CLEAR (*)    Ketones, ur 20 (*)    All other components within normal limits  LIPASE, BLOOD   ____________________________________________  EKG  ED ECG REPORT I, [place] N , the attending physician, personally viewed and interpreted this ECG.   Date: 02/01/2018  EKG Time: 20 3:07 PM  Rate: 100  Rhythm: Normal sinus rhythm  Axis: Normal  Intervals: Normal  ST&T Change: None  ____________________________________________  RADIOLOGY I, [place] N , personally viewed and evaluated these images (plain radiographs) as part of my medical decision making, as well as reviewing the written report by the radiologist.  ED MD interpretation: Uncomplicated acute appendicitis.  Official radiology report(s): Ct Abdomen Pelvis W Contrast  Result Date: 02/01/2018 CLINICAL DATA:  Right upper/right lower quadrant pain. Pain onset today. EXAM: CT ABDOMEN AND PELVIS WITH CONTRAST TECHNIQUE: Multidetector CT imaging of the abdomen and pelvis was performed using the standard protocol following bolus administration of intravenous contrast. CONTRAST:  140mL OMNIPAQUE IOHEXOL 300 MG/ML  SOLN COMPARISON:  CT 05/08/2012 FINDINGS: Lower chest: Mild linear atelectasis in the right middle and right lower lobes. No pleural fluid or confluent consolidation. Hepatobiliary: Mild hepatic steatosis. No focal hepatic lesion. Gallbladder physiologically distended, no calcified stone. No biliary dilatation. Pancreas: No ductal dilatation or inflammation. Spleen: Normal in size without  focal abnormality. Adrenals/Urinary Tract: Normal adrenal glands. No hydronephrosis or perinephric edema. Homogeneous renal enhancement with symmetric excretion on delayed phase imaging. Small cyst in the anterior right kidney. Urinary bladder is partially distended without wall thickening. Stomach/Bowel: Acute appendicitis as described below. Stomach is partially distended. No bowel wall thickening, inflammatory change, or obstruction. Appendix: Location: Retrocecal Diameter: 10 mm Appendicolith: No. Mucosal hyper-enhancement: Yes Extraluminal gas: No Periappendiceal collection: No. Moderate periappendiceal stranding with small amount of free fluid. No abscess. Vascular/Lymphatic: Mild aortic atherosclerosis. No acute vascular findings. A prominent Portacaval node is likely reactive. Reproductive: Prostate is unremarkable. Other: Fat within both inguinal canals. No free air. Musculoskeletal: Degenerative disc disease at L5-S1. Scattered facet arthropathy. There are no acute or suspicious osseous abnormalities. IMPRESSION: 1. Uncomplicated acute appendicitis. 2. Mild hepatic steatosis. 3.  Aortic Atherosclerosis (ICD10-I70.0). Electronically Signed   By: Keith Rake M.D.   On: 02/01/2018 23:59     Procedures   ____________________________________________   INITIAL IMPRESSION / ASSESSMENT AND PLAN / ED COURSE  As part of my medical decision making, I reviewed the following data within the electronic  MEDICAL RECORD NUMBER   57 year old male presenting with above-stated history and physical exam secondary to right upper quadrant/right lower quadrant abdominal pain.  Concern for possible appendicitis less likely cholelithiasis versus cholecystitis.  Laboratory data revealed an elevated white blood cell count of 15.7.  CT scan revealed uncomplicated acute appendicitis.  Patient discussed with Dr. Peyton Najjar general surgery for hospital admission further evaluation and  management. ____________________________________________  FINAL CLINICAL IMPRESSION(S) / ED DIAGNOSES  Final diagnoses:  Acute appendicitis, unspecified acute appendicitis type     MEDICATIONS GIVEN DURING THIS VISIT:  Medications  piperacillin-tazobactam (ZOSYN) IVPB 3.375 g (has no administration in time range)  fentaNYL (SUBLIMAZE) injection 50 mcg (50 mcg Intravenous Given 02/01/18 2325)  ondansetron (ZOFRAN) injection 4 mg (4 mg Intravenous Given 02/01/18 2325)  iohexol (OMNIPAQUE) 300 MG/ML solution 125 mL (100 mLs Intravenous Contrast Given 02/01/18 2338)     ED Discharge Orders    None       Note:  This document was prepared using Dragon voice recognition software and may include unintentional dictation errors.    Gregor Hams, MD 02/02/18 Shelah Lewandowsky

## 2018-02-01 NOTE — ED Triage Notes (Addendum)
Pt reports right upper quadrant pain since this am; pain is sharp with movement; area tender on palpation; pt says he had similar pain the beginning of October and saw his provider; was told he thought it was a hernia and it would fix itself; but if the pain got worse "he would take care of it"; pt says the pain has been constant all day; denies N/V

## 2018-02-02 ENCOUNTER — Observation Stay: Payer: Self-pay | Admitting: Anesthesiology

## 2018-02-02 ENCOUNTER — Encounter
Admission: EM | Disposition: A | Discharge status: Home / Self Care | Payer: Self-pay | Source: Home / Self Care | Attending: Emergency Medicine

## 2018-02-02 ENCOUNTER — Encounter: Payer: Self-pay | Admitting: Anesthesiology

## 2018-02-02 ENCOUNTER — Other Ambulatory Visit: Payer: Self-pay

## 2018-02-02 DIAGNOSIS — K353 Acute appendicitis with localized peritonitis, without perforation or gangrene: Secondary | ICD-10-CM | POA: Diagnosis present

## 2018-02-02 HISTORY — PX: LAPAROSCOPIC APPENDECTOMY: SHX408

## 2018-02-02 SURGERY — APPENDECTOMY, LAPAROSCOPIC
Anesthesia: General | Site: Abdomen

## 2018-02-02 MED ORDER — MIDAZOLAM HCL 2 MG/2ML IJ SOLN
INTRAMUSCULAR | Status: DC | PRN
  Administered 2018-02-02: 2 mg via INTRAVENOUS

## 2018-02-02 MED ORDER — TRAMADOL HCL 50 MG PO TABS
50.0000 mg | ORAL_TABLET | Freq: Four times a day (QID) | ORAL | Status: AC | PRN
  Administered 2018-02-02: 50 mg via ORAL
  Filled 2018-02-02: qty 1

## 2018-02-02 MED ORDER — FENTANYL CITRATE (PF) 100 MCG/2ML IJ SOLN
INTRAMUSCULAR | Status: DC | PRN
  Administered 2018-02-02 (×4): 50 ug via INTRAVENOUS

## 2018-02-02 MED ORDER — ONDANSETRON 4 MG PO TBDP: 4.0000 mg | ORAL_TABLET | Freq: Four times a day (QID) | ORAL | Status: DC | PRN

## 2018-02-02 MED ORDER — PROPOFOL 10 MG/ML IV BOLUS
INTRAVENOUS | Status: DC | PRN
  Administered 2018-02-02: 200 mg via INTRAVENOUS

## 2018-02-02 MED ORDER — PIPERACILLIN-TAZOBACTAM 3.375 G IVPB
3.3750 g | Freq: Three times a day (TID) | INTRAVENOUS | Status: DC
  Administered 2018-02-02 (×3): 3.375 g via INTRAVENOUS
  Filled 2018-02-02 (×4): qty 50

## 2018-02-02 MED ORDER — LACTATED RINGERS IV SOLN
INTRAVENOUS | Status: DC | PRN
  Administered 2018-02-02: 02:00:00 via INTRAVENOUS

## 2018-02-02 MED ORDER — LIDOCAINE HCL (CARDIAC) PF 100 MG/5ML IV SOSY
PREFILLED_SYRINGE | INTRAVENOUS | Status: DC | PRN
  Administered 2018-02-02: 100 mg via INTRAVENOUS

## 2018-02-02 MED ORDER — ENOXAPARIN SODIUM 40 MG/0.4ML ~~LOC~~ SOLN
40.0000 mg | SUBCUTANEOUS | Status: DC
  Administered 2018-02-02 – 2018-02-03 (×2): 40 mg via SUBCUTANEOUS
  Filled 2018-02-02 (×2): qty 0.4

## 2018-02-02 MED ORDER — MENTHOL 3 MG MT LOZG
1.0000 | LOZENGE | OROMUCOSAL | Status: DC | PRN
  Administered 2018-02-02: 3 mg via ORAL
  Filled 2018-02-02: qty 9

## 2018-02-02 MED ORDER — FENTANYL CITRATE (PF) 100 MCG/2ML IJ SOLN
INTRAMUSCULAR | Status: AC
  Administered 2018-02-02: 50 ug via INTRAVENOUS
  Filled 2018-02-02: qty 2

## 2018-02-02 MED ORDER — BUPIVACAINE-EPINEPHRINE (PF) 0.25% -1:200000 IJ SOLN
INTRAMUSCULAR | Status: DC | PRN
  Administered 2018-02-02: 15 mL

## 2018-02-02 MED ORDER — SODIUM CHLORIDE 0.9 % IV SOLN
INTRAVENOUS | Status: DC | PRN
  Administered 2018-02-02 (×2): 250 mL via INTRAVENOUS

## 2018-02-02 MED ORDER — FENTANYL CITRATE (PF) 100 MCG/2ML IJ SOLN
50.0000 ug | Freq: Once | INTRAMUSCULAR | Status: AC
  Administered 2018-02-02: 50 ug via INTRAVENOUS
  Filled 2018-02-02: qty 2

## 2018-02-02 MED ORDER — ONDANSETRON HCL 4 MG/2ML IJ SOLN
INTRAMUSCULAR | Status: DC | PRN
  Administered 2018-02-02: 4 mg via INTRAVENOUS

## 2018-02-02 MED ORDER — FENTANYL CITRATE (PF) 100 MCG/2ML IJ SOLN
INTRAMUSCULAR | Status: AC
  Administered 2018-02-02: 25 ug via INTRAVENOUS
  Filled 2018-02-02: qty 2

## 2018-02-02 MED ORDER — HYDROMORPHONE HCL 1 MG/ML IJ SOLN
0.5000 mg | INTRAMUSCULAR | Status: DC | PRN
  Administered 2018-02-02 (×2): 0.5 mg via INTRAVENOUS

## 2018-02-02 MED ORDER — SODIUM CHLORIDE 0.9 % IV SOLN
INTRAVENOUS | Status: DC
  Administered 2018-02-02 (×3): via INTRAVENOUS

## 2018-02-02 MED ORDER — ONDANSETRON HCL 4 MG/2ML IJ SOLN
4.0000 mg | Freq: Four times a day (QID) | INTRAMUSCULAR | Status: DC | PRN
  Administered 2018-02-02 (×2): 4 mg via INTRAVENOUS
  Filled 2018-02-02 (×2): qty 2

## 2018-02-02 MED ORDER — LISINOPRIL 10 MG PO TABS
20.0000 mg | ORAL_TABLET | Freq: Every day | ORAL | Status: DC
  Administered 2018-02-02 – 2018-02-03 (×2): 20 mg via ORAL
  Filled 2018-02-02 (×2): qty 2

## 2018-02-02 MED ORDER — SUCCINYLCHOLINE CHLORIDE 20 MG/ML IJ SOLN
INTRAMUSCULAR | Status: DC | PRN
  Administered 2018-02-02: 100 mg via INTRAVENOUS

## 2018-02-02 MED ORDER — FENTANYL CITRATE (PF) 100 MCG/2ML IJ SOLN
25.0000 ug | INTRAMUSCULAR | Status: DC | PRN
  Administered 2018-02-02: 25 ug via INTRAVENOUS
  Administered 2018-02-02: 50 ug via INTRAVENOUS
  Administered 2018-02-02: 25 ug via INTRAVENOUS
  Administered 2018-02-02: 50 ug via INTRAVENOUS

## 2018-02-02 MED ORDER — PIPERACILLIN-TAZOBACTAM 3.375 G IVPB 30 MIN
3.3750 g | Freq: Once | INTRAVENOUS | Status: AC
  Administered 2018-02-02: 3.375 g via INTRAVENOUS
  Filled 2018-02-02: qty 50

## 2018-02-02 MED ORDER — KETOROLAC TROMETHAMINE 30 MG/ML IJ SOLN
30.0000 mg | Freq: Three times a day (TID) | INTRAMUSCULAR | Status: DC
  Administered 2018-02-02 – 2018-02-03 (×3): 30 mg via INTRAVENOUS
  Filled 2018-02-02 (×3): qty 1

## 2018-02-02 MED ORDER — SUGAMMADEX SODIUM 200 MG/2ML IV SOLN
INTRAVENOUS | Status: DC | PRN
  Administered 2018-02-02: 210 mg via INTRAVENOUS

## 2018-02-02 MED ORDER — HYDROMORPHONE HCL 1 MG/ML IJ SOLN
INTRAMUSCULAR | Status: AC
  Administered 2018-02-02: 0.5 mg via INTRAVENOUS
  Filled 2018-02-02: qty 1

## 2018-02-02 MED ORDER — FENTANYL CITRATE (PF) 100 MCG/2ML IJ SOLN
25.0000 ug | INTRAMUSCULAR | Status: DC | PRN
  Administered 2018-02-02 – 2018-02-03 (×6): 25 ug via INTRAVENOUS
  Filled 2018-02-02 (×6): qty 2

## 2018-02-02 MED ORDER — PROMETHAZINE HCL 25 MG/ML IJ SOLN: 6.2500 mg | INTRAMUSCULAR | Status: DC | PRN

## 2018-02-02 MED ORDER — EPHEDRINE SULFATE 50 MG/ML IJ SOLN
INTRAMUSCULAR | Status: DC | PRN
  Administered 2018-02-02: 10 mg via INTRAVENOUS

## 2018-02-02 MED ORDER — ROCURONIUM BROMIDE 100 MG/10ML IV SOLN
INTRAVENOUS | Status: DC | PRN
  Administered 2018-02-02: 50 mg via INTRAVENOUS

## 2018-02-02 SURGICAL SUPPLY — 36 items
APPLIER CLIP LOGIC TI 5 (MISCELLANEOUS) IMPLANT
BLADE SURG SZ11 CARB STEEL (BLADE) ×2 IMPLANT
CANISTER SUCT 1200ML W/VALVE (MISCELLANEOUS) ×2 IMPLANT
CHLORAPREP W/TINT 26ML (MISCELLANEOUS) ×2 IMPLANT
COVER WAND RF STERILE (DRAPES) ×2 IMPLANT
CUTTER FLEX LINEAR 45M (STAPLE) ×2 IMPLANT
DERMABOND ADVANCED (GAUZE/BANDAGES/DRESSINGS) ×1
DERMABOND ADVANCED .7 DNX12 (GAUZE/BANDAGES/DRESSINGS) ×1 IMPLANT
ELECT REM PT RETURN 9FT ADLT (ELECTROSURGICAL) ×2
ELECTRODE REM PT RTRN 9FT ADLT (ELECTROSURGICAL) ×1 IMPLANT
GLOVE BIO SURGEON STRL SZ 6.5 (GLOVE) ×8 IMPLANT
GOWN STRL REUS W/ TWL LRG LVL3 (GOWN DISPOSABLE) ×2 IMPLANT
GOWN STRL REUS W/TWL LRG LVL3 (GOWN DISPOSABLE) ×2
GRASPER SUT TROCAR 14GX15 (MISCELLANEOUS) ×2 IMPLANT
HANDLE YANKAUER SUCT BULB TIP (MISCELLANEOUS) ×2 IMPLANT
IRRIGATION STRYKERFLOW (MISCELLANEOUS) ×1 IMPLANT
IRRIGATOR STRYKERFLOW (MISCELLANEOUS) ×2
IV NS 1000ML (IV SOLUTION) ×1
IV NS 1000ML BAXH (IV SOLUTION) ×1 IMPLANT
KIT TURNOVER KIT A (KITS) ×2 IMPLANT
LIGASURE LAP MARYLAND 5MM 37CM (ELECTROSURGICAL) ×2 IMPLANT
NEEDLE HYPO 22GX1.5 SAFETY (NEEDLE) ×2 IMPLANT
NEEDLE VERESS 14GA 120MM (NEEDLE) ×2 IMPLANT
NS IRRIG 500ML POUR BTL (IV SOLUTION) ×2 IMPLANT
PACK LAP CHOLECYSTECTOMY (MISCELLANEOUS) ×2 IMPLANT
POUCH ENDO CATCH 10MM SPEC (MISCELLANEOUS) ×2 IMPLANT
RELOAD 45 VASCULAR/THIN (ENDOMECHANICALS) IMPLANT
RELOAD STAPLE TA45 3.5 REG BLU (ENDOMECHANICALS) ×2 IMPLANT
SCISSORS METZENBAUM CVD 33 (INSTRUMENTS) IMPLANT
SPONGE GAUZE 2X2 8PLY STRL LF (GAUZE/BANDAGES/DRESSINGS) ×2 IMPLANT
SUT MNCRL AB 4-0 PS2 18 (SUTURE) ×2 IMPLANT
SUT VICRYL PLUS ABS 0 54 (SUTURE) ×2 IMPLANT
TRAY FOLEY MTR SLVR 16FR STAT (SET/KITS/TRAYS/PACK) ×2 IMPLANT
TROCAR XCEL 12X100 BLDLESS (ENDOMECHANICALS) ×2 IMPLANT
TROCAR XCEL NON-BLD 5MMX100MML (ENDOMECHANICALS) ×4 IMPLANT
TUBING INSUFFLATION (TUBING) ×2 IMPLANT

## 2018-02-02 NOTE — Progress Notes (Signed)
[place] Hospital Day(s): 0.   Post op day(s): Day of Surgery.   Interval History: Patient seen and examined, no acute events or new complaints overnight. Patient reports having significant pain not controlled with oral medications and nausea.  Vital signs in last 24 hours: [min-max] current  Temp:  [97.5 F (36.4 C)-98.5 F (36.9 C)] 98.3 F (36.8 C) (12/16 1151) Pulse Rate:  [70-110] 78 (12/16 1151) Resp:  [11-28] 18 (12/16 1151) BP: (110-138)/(58-108) 134/78 (12/16 1151) SpO2:  [92 %-100 %] 96 % (12/16 1151) Weight:  [103.4 kg-107.9 kg] 107.9 kg (12/16 0424)     Height: 6\' 1"  (185.4 cm) Weight: 107.9 kg BMI (Calculated): 31.39   Physical Exam:  Constitutional: alert, cooperative and no distress  Respiratory: breathing non-labored at rest  Cardiovascular: regular rate and sinus rhythm  Gastrointestinal: soft, non-tender, and non-distended. Wounds are dry and clean.   Labs:  CBC Latest Ref Rng & Units 02/01/2018 07/31/2015  WBC 4.0 - 10.5 K/uL 15.7(H) 7.1  Hemoglobin 13.0 - 17.0 g/dL 16.6 16.0  Hematocrit 39.0 - 52.0 % 46.2 46.1  Platelets 150 - 400 K/uL 240 186   CMP Latest Ref Rng & Units 02/01/2018 07/31/2015 04/04/2015  Glucose 70 - 99 mg/dL 116(H) 107(H) 86  BUN 6 - 20 mg/dL 12 16 20   Creatinine 0.61 - 1.24 mg/dL 0.89 0.68 0.76  Sodium 135 - 145 mmol/L 136 137 143  Potassium 3.5 - 5.1 mmol/L 3.6 3.7 4.3  Chloride 98 - 111 mmol/L 103 105 102  CO2 22 - 32 mmol/L 22 22 23   Calcium 8.9 - 10.3 mg/dL 10.3 9.3 9.4  Total Protein 6.5 - 8.1 g/dL 8.1 - -  Total Bilirubin 0.3 - 1.2 mg/dL 1.6(H) - -  Alkaline Phos 38 - 126 U/L 70 - -  AST 15 - 41 U/L 33 - -  ALT 0 - 44 U/L 62(H) - -    Imaging studies: No new pertinent imaging studies   Assessment/Plan:  57 y.o. male with acute appendicitis Day of Surgery s/p laparoscopic appendectomy. Patient with significant post op pain not controlled with oral medications. Patient using frequent fentanyl. Will add  Toradol to improve pain. Also with nausea. May be from pain, anesthesia or medication side effects. Will follow pain control and diet toleration.   Arnold Long, MD

## 2018-02-02 NOTE — H&P (Signed)
SURGICAL CONSULTATION NOTE   HISTORY OF PRESENT ILLNESS (HPI):  57 y.o. male presented to [facility] ED for evaluation of abdominal pain since a day ago. Patient reports started with abdominal pain yesterday morning. Pain localized on the right lower quadrant. Pain does not radiates. Pain aggravated with palpation of abdomen and certain abdominal wall movement. Pain alleviated with Fentanyl.  Denies denies nausea, vomiting or diarrhea.  Surgery is consulted by Dr. Owens Shark in this context for evaluation and management of acute appendicitis.  PAST MEDICAL HISTORY (PMH):  Past Medical History:  Diagnosis Date  . Esophagitis   . Gout   . History of irregular heartbeat   . Hypertension      PAST SURGICAL HISTORY ([place]):  Past Surgical History:  Procedure Laterality Date  . SPINE SURGERY  1997     MEDICATIONS:  Prior to Admission medications   Medication Sig Start Date End Date Taking? Authorizing Provider  colchicine 0.6 MG tablet TAKE 2 TABLETS BY MOUTH NOW THEN 1 TABLET 1 HOUR LATER THEN 1 TWICE DAILY FOR GOUT ATTACK 11/21/14  Yes Crissman, Jeannette How, MD  lisinopril (PRINIVIL,ZESTRIL) 20 MG tablet TAKE 1 TABLET BY MOUTH DAILY 01/12/18  Yes Crissman, Jeannette How, MD     ALLERGIES:  Allergies  Allergen Reactions  . Amlodipine Swelling  . Morphine And Related Nausea And Vomiting     SOCIAL HISTORY:  Social History   Socioeconomic History  . Marital status: Married    Spouse name: Not on file  . Number of children: Not on file  . Years of education: Not on file  . Highest education level: Not on file  Occupational History  . Not on file  Social Needs  . Financial resource strain: Not on file  . Food insecurity:    Worry: Not on file    Inability: Not on file  . Transportation needs:    Medical: Not on file    Non-medical: Not on file  Tobacco Use  . Smoking status: Never Smoker  . Smokeless tobacco: Never Used  Substance and Sexual Activity  . Alcohol use: Yes    Comment: 4-6 on  the weekends  . Drug use: No  . Sexual activity: Not on file  Lifestyle  . Physical activity:    Days per week: Not on file    Minutes per session: Not on file  . Stress: Not on file  Relationships  . Social connections:    Talks on phone: Not on file    Gets together: Not on file    Attends religious service: Not on file    Active member of club or organization: Not on file    Attends meetings of clubs or organizations: Not on file    Relationship status: Not on file  . Intimate partner violence:    Fear of current or ex partner: Not on file    Emotionally abused: Not on file    Physically abused: Not on file    Forced sexual activity: Not on file  Other Topics Concern  . Not on file  Social History Narrative  . Not on file    The patient currently resides (home / rehab facility / nursing home): Home The patient normally is (ambulatory / bedbound): Ambulatory   FAMILY HISTORY:  Family History  Problem Relation Age of Onset  . Hyperlipidemia Mother   . Stroke Father   . Hyperlipidemia Father   . Diabetes Father   . Heart disease Father   .  Thyroid disease Sister   . Thyroid disease Brother      REVIEW OF SYSTEMS:  Constitutional: denies weight loss, fever, chills, or sweats  Eyes: denies any other vision changes, history of eye injury  ENT: denies sore throat, hearing problems  Respiratory: denies shortness of breath, wheezing  Cardiovascular: denies chest pain, palpitations  Gastrointestinal: positive abdominal pain, Negative N/V, or diarrhea Genitourinary: denies burning with urination or urinary frequency Musculoskeletal: denies any other joint pains or cramps  Skin: denies any other rashes or skin discolorations  Neurological: denies any other headache, dizziness, weakness  Psychiatric: denies any other depression, anxiety   All other review of systems were negative   VITAL SIGNS:  Temp:  [97.7 F (36.5 C)] 97.7 F (36.5 C) (12/15 2209) Pulse Rate:   [77-110] 77 (12/16 0014) Resp:  [20-28] 28 (12/16 0014) BP: (129-138)/(58-108) 129/58 (12/16 0014) SpO2:  [96 %-97 %] 96 % (12/16 0014) Weight:  [103.4 kg] 103.4 kg (12/15 2210)     Height: 6\' 1"  (185.4 cm) Weight: 103.4 kg BMI (Calculated): 30.09   INTAKE/OUTPUT:  This shift: No intake/output data recorded.  Last 2 shifts: @IOLAST2SHIFTS @   PHYSICAL EXAM:  Constitutional:  -- Normal body habitus  -- Awake, alert, and oriented x3  Eyes:  -- Pupils equally round and reactive to light  -- No scleral icterus  Ear, nose, and throat:  -- No jugular venous distension  Pulmonary:  -- No crackles  -- Equal breath sounds bilaterally -- Breathing non-labored at rest Cardiovascular:  -- S1, S2 present  -- No pericardial rubs Gastrointestinal:  -- Abdomen soft, tender to palpation on right lower quadrant, non-distended, no guarding or rebound tenderness -- No abdominal masses appreciated, pulsatile or otherwise  Musculoskeletal and Integumentary:  -- Wounds or skin discoloration: None appreciated -- Extremities: B/L UE and LE FROM, hands and feet warm, no edema  Neurologic:  -- Motor function: intact and symmetric -- Sensation: intact and symmetric   Labs:  CBC Latest Ref Rng & Units 02/01/2018 07/31/2015  WBC 4.0 - 10.5 K/uL 15.7(H) 7.1  Hemoglobin 13.0 - 17.0 g/dL 16.6 16.0  Hematocrit 39.0 - 52.0 % 46.2 46.1  Platelets 150 - 400 K/uL 240 186   CMP Latest Ref Rng & Units 02/01/2018 07/31/2015 04/04/2015  Glucose 70 - 99 mg/dL 116(H) 107(H) 86  BUN 6 - 20 mg/dL 12 16 20   Creatinine 0.61 - 1.24 mg/dL 0.89 0.68 0.76  Sodium 135 - 145 mmol/L 136 137 143  Potassium 3.5 - 5.1 mmol/L 3.6 3.7 4.3  Chloride 98 - 111 mmol/L 103 105 102  CO2 22 - 32 mmol/L 22 22 23   Calcium 8.9 - 10.3 mg/dL 10.3 9.3 9.4  Total Protein 6.5 - 8.1 g/dL 8.1 - -  Total Bilirubin 0.3 - 1.2 mg/dL 1.6(H) - -  Alkaline Phos 38 - 126 U/L 70 - -  AST 15 - 41 U/L 33 - -  ALT 0 - 44 U/L 62(H) - -    Imaging  studies:  EXAM: CT ABDOMEN AND PELVIS WITH CONTRAST  TECHNIQUE: Multidetector CT imaging of the abdomen and pelvis was performed using the standard protocol following bolus administration of intravenous contrast.  CONTRAST:  146mL OMNIPAQUE IOHEXOL 300 MG/ML  SOLN  COMPARISON:  CT 05/08/2012  FINDINGS: Lower chest: Mild linear atelectasis in the right middle and right lower lobes. No pleural fluid or confluent consolidation.  Hepatobiliary: Mild hepatic steatosis. No focal hepatic lesion. Gallbladder physiologically distended, no calcified stone. No biliary  dilatation.  Pancreas: No ductal dilatation or inflammation.  Spleen: Normal in size without focal abnormality.  Adrenals/Urinary Tract: Normal adrenal glands. No hydronephrosis or perinephric edema. Homogeneous renal enhancement with symmetric excretion on delayed phase imaging. Small cyst in the anterior right kidney. Urinary bladder is partially distended without wall thickening.  Stomach/Bowel: Acute appendicitis as described below. Stomach is partially distended. No bowel wall thickening, inflammatory change, or obstruction.  Appendix: Location: Retrocecal  Diameter: 10 mm  Appendicolith: No.  Mucosal hyper-enhancement: Yes  Extraluminal gas: No  Periappendiceal collection: No. Moderate periappendiceal stranding with small amount of free fluid. No abscess.  Vascular/Lymphatic: Mild aortic atherosclerosis. No acute vascular findings. A prominent Portacaval node is likely reactive.  Reproductive: Prostate is unremarkable.  Other: Fat within both inguinal canals. No free air.  Musculoskeletal: Degenerative disc disease at L5-S1. Scattered facet arthropathy. There are no acute or suspicious osseous abnormalities.  IMPRESSION: 1. Uncomplicated acute appendicitis. 2. Mild hepatic steatosis. 3.  Aortic Atherosclerosis (ICD10-I70.0).   Electronically Signed   By: Keith Rake  M.D.   On: 02/01/2018 23:59   Assessment/Plan:  57 y.o. male with acute appendicitis, complicated by pertinent comorbidities including hypertension.  Patient with history, physical exam and images consistent with acute appendicitis. Patient oriented about diagnosis and surgical management as treatment. Patient oriented about goals of surgery and its risk including: bowel injury, infection, abscess, bleeding, leak from cecum, intestinal adhesions, bowel obstruction, fistula, injury to the ureter among others.  Patient understood and agreed to proceed with surgery. Will admit patient, already started on antibiotic therapy, will give IV hydration since patient is NPO and schedule to OR.   Arnold Long, MD   .

## 2018-02-02 NOTE — Anesthesia Procedure Notes (Signed)
Procedure Name: Intubation Date/Time: 02/02/2018 2:04 AM Performed by: Lendon Colonel, CRNA Pre-anesthesia Checklist: Patient identified, Patient being monitored, Timeout performed, Emergency Drugs available and Suction available Patient Re-evaluated:Patient Re-evaluated prior to induction Oxygen Delivery Method: Circle system utilized Preoxygenation: Pre-oxygenation with 100% oxygen Induction Type: IV induction, Rapid sequence and Cricoid Pressure applied Laryngoscope Size: Miller and 2 Grade View: Grade I Tube type: Oral Tube size: 7.0 mm Number of attempts: 1 Airway Equipment and Method: Stylet Placement Confirmation: ETT inserted through vocal cords under direct vision,  positive ETCO2 and breath sounds checked- equal and bilateral Secured at: 21 cm Tube secured with: Tape Dental Injury: Teeth and Oropharynx as per pre-operative assessment

## 2018-02-02 NOTE — Op Note (Signed)
Preoperative diagnosis: Acute appendicitis.  Postoperative diagnosis: Acute appendicitis  Procedure: Laparoscopic appendectomy.  Anesthesia: GETA  Surgeon: Dr. Windell Moment, MD  Wound Classification: Contaminated  Indications: Patient is a 57 y.o. male  presented with right lower quadrant pain of 1 day of duration, elevated WBC. Computed tomography scan and physical examination were consistent with acute appendicitis.   Findings: 1. Acutely inflamed appendix 2. No peri-appendiceal abscess or phlegmon 3. Normal anatomy 4. Appendiceal artery ligated and divided with EndoGIA 5. Adequate hemostasis.   Description of procedure: The patient was placed on the operating table in the supine position. General anesthesia was induced. A time-out was completed verifying correct patient, procedure, site, positioning, and implant(s) and/or special equipment prior to beginning this procedure. A Foley catheter and orogastric tubes were placed. The abdomen was prepped and draped in the usual sterile fashion.  An incision was made in a natural skin line above the umbilicus.  The fascia was elevated and the Veress needle inserted. Proper position was confirmed by aspiration and saline meniscus test. The abdomen was insufflated with carbon dioxide to a pressure of 15 mmHg. The patient tolerated insufflation well. A 12-mm optiview trocar was then inserted supraumbilically. The laparoscope was inserted and the abdomen inspected. No injuries from initial trocar placement were noted. Turbid fluid was noted in the right lower quadrant. Under direct visualization, an 5-mm trocar was inserted in the left lower quadrant lateral to the rectus muscle. A 5-mm port was then placed above the symphysis pubis on midline.  Care was taken to avoid injury to the bladder or inferior epigastric vessels. The table was placed in the Trendelenburg position with the right side elevated.  The cecum was gently grasped with an endoscopic  graspers and pulled toward (the left upper quadrant). An atraumatic grasper was then passed through the suprapubic port and omentum was dissected away until the appendix was identified. The appendix was then grasped and elevated. It was noted to be inflamed.  An endoscopic linear cutting stapler was then used to divide and staple the base of the appendix. The mesoappendix was then divided with LigaSure.  The appendix was placed in an endoscopic retrieval bag and removed.  Fluid was suctioned and no other pathology was identified.  Secondary trocars were removed under direct vision. No bleeding was noted. The laparoscope was withdrawn and the umbilical trocar removed. The abdomen was allowed to collapse. All trocar sites greater than 5 mm were closed with Vicryl 0. The skin was closed with subcuticular sutures Monocryl 4-0 of and steristrips.  The patient tolerated the procedure well and was taken to the postanesthesia care unit in satisfactory condition.   Specimen: Appendix  Complications: None  Estimated Blood Loss: 5 mL

## 2018-02-02 NOTE — Anesthesia Post-op Follow-up Note (Signed)
Anesthesia QCDR form completed.        

## 2018-02-02 NOTE — Anesthesia Preprocedure Evaluation (Signed)
Anesthesia Evaluation  Patient identified by MRN, date of birth, ID band Patient awake    Reviewed: Allergy & Precautions, H&P , NPO status , Patient's Chart, lab work & pertinent test results, reviewed documented beta blocker date and time   History of Anesthesia Complications Negative for: history of anesthetic complications  Airway Mallampati: II  TM Distance: >3 FB Neck ROM: full    Dental  (+) Dental Advidsory Given, Missing, Teeth Intact   Pulmonary neg pulmonary ROS,           Cardiovascular Exercise Tolerance: Good hypertension, (-) angina(-) CAD, (-) Past MI, (-) Cardiac Stents and (-) CABG + dysrhythmias (-) Valvular Problems/Murmurs     Neuro/Psych negative neurological ROS  negative psych ROS   GI/Hepatic negative GI ROS, Neg liver ROS,   Endo/Other  negative endocrine ROS  Renal/GU negative Renal ROS  negative genitourinary   Musculoskeletal   Abdominal   Peds  Hematology negative hematology ROS (+)   Anesthesia Other Findings Past Medical History: No date: Esophagitis No date: Gout No date: History of irregular heartbeat No date: Hypertension   Reproductive/Obstetrics negative OB ROS                             Anesthesia Physical Anesthesia Plan  ASA: II  Anesthesia Plan: General   Post-op Pain Management:    Induction: Intravenous, Rapid sequence and Cricoid pressure planned  PONV Risk Score and Plan: 2 and Ondansetron, Dexamethasone, Midazolam, Promethazine and Treatment may vary due to age or medical condition  Airway Management Planned: Oral ETT  Additional Equipment:   Intra-op Plan:   Post-operative Plan: Extubation in OR  Informed Consent: I have reviewed the patients History and Physical, chart, labs and discussed the procedure including the risks, benefits and alternatives for the proposed anesthesia with the patient or authorized representative who  has indicated his/her understanding and acceptance.   Dental Advisory Given  Plan Discussed with: Anesthesiologist, CRNA and Surgeon  Anesthesia Plan Comments:         Anesthesia Quick Evaluation

## 2018-02-02 NOTE — Transfer of Care (Signed)
Immediate Anesthesia Transfer of Care Note  Patient: Kurt Mendoza  Procedure(s) Performed: APPENDECTOMY LAPAROSCOPIC (N/A Abdomen)  Patient Location: PACU  Anesthesia Type:General  Level of Consciousness: awake, alert , oriented and patient cooperative  Airway & Oxygen Therapy: Patient Spontanous Breathing and Patient connected to face mask oxygen  Post-op Assessment: Report given to RN and Post -op Vital signs reviewed and stable  Post vital signs: Reviewed and stable  Last Vitals:  Vitals Value Taken Time  BP 122/76 02/02/2018  3:21 AM  Temp 36.9 C 02/02/2018  3:21 AM  Pulse 79 02/02/2018  3:26 AM  Resp 10 02/02/2018  3:26 AM  SpO2 100 % 02/02/2018  3:26 AM  Vitals shown include unvalidated device data.  Last Pain:  Vitals:   02/02/18 0014  TempSrc:   PainSc: 5          Complications: No apparent anesthesia complications

## 2018-02-03 LAB — SURGICAL PATHOLOGY

## 2018-02-03 MED ORDER — KETOROLAC TROMETHAMINE 10 MG PO TABS: 10.0000 mg | ORAL_TABLET | Freq: Four times a day (QID) | ORAL | 0 refills | Status: AC | PRN

## 2018-02-03 NOTE — Discharge Instructions (Signed)
Laparoscopic Appendectomy, Adult, Care After These instructions give you information about caring for yourself after your procedure. Your doctor may also give you more specific instructions. Call your doctor if you have any problems or questions after your procedure. Follow these instructions at home: Medicines  Take over-the-counter and prescription medicines only as told by your doctor.  Do not drive for 24 hours if you received a sedative.  Do not drive or use heavy machinery while taking prescription pain medicine.  If you were prescribed an antibiotic medicine, take it as told by your doctor. Do not stop taking it even if you start to feel better. Activity  Do not lift anything that is heavier than 10 pounds (4.5 kg) for 3 weeks or as told by your doctor.  Do not play contact sports for 3 weeks or as told by your doctor.  Slowly return to your normal activities. Bathing  Keep your cuts from surgery (incisions) clean and dry. ? Gently wash the cuts with soap and water. ? Rinse the cuts with water until the soap is gone. ? Pat the cuts dry with a clean towel. Do not rub the cuts.  You may take showers after 48 hours.  Do not take baths, swim, or use a hot tub for 2 weeks or as told by your doctor. Cut Care  Follow instructions from your doctor about how to take care of your cuts. Make sure you: ? Wash your hands with soap and water before you change your bandage (dressing). If you do not have soap and water, use hand sanitizer. ? Change your bandage as told by your doctor. ? Leave stitches (sutures), skin glue, or skin tape (adhesive) strips in place. They may need to stay in place for 2 weeks or longer. If tape strips get loose and curl up, you may trim the loose edges. Do not remove tape strips completely unless your doctor says it is okay.  Check your cuts every day for signs of infection. Check for: ? More redness, swelling, or pain. ? More fluid or  blood. ? Warmth. ? Pus or a bad smell. Other Instructions  If you were sent home with a drain, follow instructions from your doctor about how to use it and care for it.  Take deep breaths. This helps to keep your lungs from getting swollen (inflamed).  To help with constipation: ? Drink plenty of fluids. ? Eat plenty of fruits and vegetables.  Keep all follow-up visits as told by your doctor. This is important. Contact a doctor if:  You have more redness, swelling, or pain around a cut from surgery.  You have more fluid or blood coming from a cut.  Your cut feels warm to the touch.  You have pus or a bad smell coming from a cut or a bandage.  The edges of a cut break open after the stitches have been taken out.  You have pain in your shoulders that gets worse.  You feel dizzy or you pass out (faint).  You have shortness of breath.  You keep feeling sick to your stomach (nauseous).  You keep throwing up (vomiting).  You get diarrhea or you cannot control your poop.  You lose your appetite.  You have swelling or pain in your legs. Get help right away if:  You have a fever.  You get a rash.  You have trouble breathing.  You have sharp pains in your chest. This information is not intended to replace advice  given to you by your health care provider. Make sure you discuss any questions you have with your health care provider. Document Released: 12/01/2008 Document Revised: 07/13/2015 Document Reviewed: 07/25/2014 Elsevier Interactive Patient Education  2018 [employer].  Diet: Resume home heart healthy regular diet.   Activity: No heavy lifting >20 pounds (children, pets, laundry, garbage) or strenuous activity until follow-up, but light activity and walking are encouraged. Do not drive or drink alcohol if taking narcotic pain medications.  Wound care: May shower with soapy water and pat dry (do not rub incisions), but no baths or submerging incision underwater  until follow-up. (no swimming)   Medications: Resume all home medications. For mild to moderate pain: acetaminophen (Tylenol) or ibuprofen (if no kidney disease). Combining Tylenol with alcohol can substantially increase your risk of causing liver disease.   Call office [phone]) at any time if any questions, worsening pain, fevers/chills, bleeding, drainage from incision site, or other concerns.

## 2018-02-03 NOTE — Progress Notes (Signed)
Discharge instructions reviewed with patient including followup visits and new medications.  Understanding was verbalized and all questions were answered.  IV removed without complication; patient tolerated well.  Patient discharged home via wheelchair in stable condition escorted by volunteer staff.  

## 2018-02-03 NOTE — Discharge Summary (Signed)
  Patient ID: Kurt Mendoza MRN: [id number] DOB/AGE: 58/58/1962 58 y.o.  Admit date: 02/01/2018 Discharge date: 02/03/2018   Discharge Diagnoses:  Active Problems:   Acute appendicitis with localized peritonitis   Procedures: Laparoscopic appendectomy   Hospital Course: patient underwent laparoscopic appendectomy and tolerated procedure. Post op had significant pain that needed to stay overnight to be able to control it without IV pain medications. Today pain is controlled, Tolerating solid diet, passing gas and ambulating.   Physical Exam  Constitutional: He is well-developed, well-nourished, and in no distress.  HENT:  Head: Normocephalic.  Cardiovascular: Normal rate and regular rhythm.  Pulmonary/Chest: Effort normal and breath sounds normal.  Abdominal: Soft. Bowel sounds are normal. He exhibits no distension. There is no abdominal tenderness. There is no rebound.  Wounds are dry and clean.   Consults: None  Disposition: Discharge disposition: 01-Home or Self Care       Discharge Instructions    Diet - low sodium heart healthy   Complete by:  As directed    Increase activity slowly   Complete by:  As directed    Lifting restrictions   Complete by:  As directed    No more than 20 pounds for 4 weeks.     Allergies as of 02/03/2018      Reactions   Amlodipine Swelling   Morphine And Related Nausea And Vomiting      Medication List    TAKE these medications   colchicine 0.6 MG tablet TAKE 2 TABLETS BY MOUTH NOW THEN 1 TABLET 1 HOUR LATER THEN 1 TWICE DAILY FOR GOUT ATTACK What changed:  See the new instructions.   ketorolac 10 MG tablet Commonly known as:  TORADOL Take 1 tablet (10 mg total) by mouth every 6 (six) hours as needed for up to 3 days.   lisinopril 20 MG tablet Commonly known as:  PRINIVIL,ZESTRIL TAKE 1 TABLET BY MOUTH DAILY      Follow-up Information    Herbert Pun, MD Follow up in 2 week(s).   Specialty:  General  Surgery Contact information: [address] [place] [address] [phone]

## 2018-02-03 NOTE — Anesthesia Postprocedure Evaluation (Signed)
Anesthesia Post Note  Patient: Kurt Mendoza  Procedure(s) Performed: APPENDECTOMY LAPAROSCOPIC (N/A Abdomen)  Patient location during evaluation: PACU Anesthesia Type: General Level of consciousness: awake and alert Pain management: pain level controlled Vital Signs Assessment: post-procedure vital signs reviewed and stable Respiratory status: spontaneous breathing, nonlabored ventilation, respiratory function stable and patient connected to nasal cannula oxygen Cardiovascular status: blood pressure returned to baseline and stable Postop Assessment: no apparent nausea or vomiting Anesthetic complications: no     Last Vitals:  Vitals:   02/03/18 0047 02/03/18 0627  BP: 114/71 110/72  Pulse: 69 65  Resp: 18 17  Temp: (!) 36.4 C 36.8 C  SpO2: 97% 96%    Last Pain:  Vitals:   02/03/18 0752  TempSrc:   PainSc: 0-No pain                 Martha Clan

## 2018-02-04 LAB — HIV ANTIBODY (ROUTINE TESTING W REFLEX): HIV Screen 4th Generation wRfx: NONREACTIVE

## 2018-03-14 IMAGING — DX DG CHEST 2V
3 series · 3 of 3 positions shown · non-contrast
Comparison: 07/31/2015

CLINICAL DATA: Shortness of breath. Left lower lobe rales. Short of
breath.

EXAM:
CHEST  2 VIEW

[chest pa]
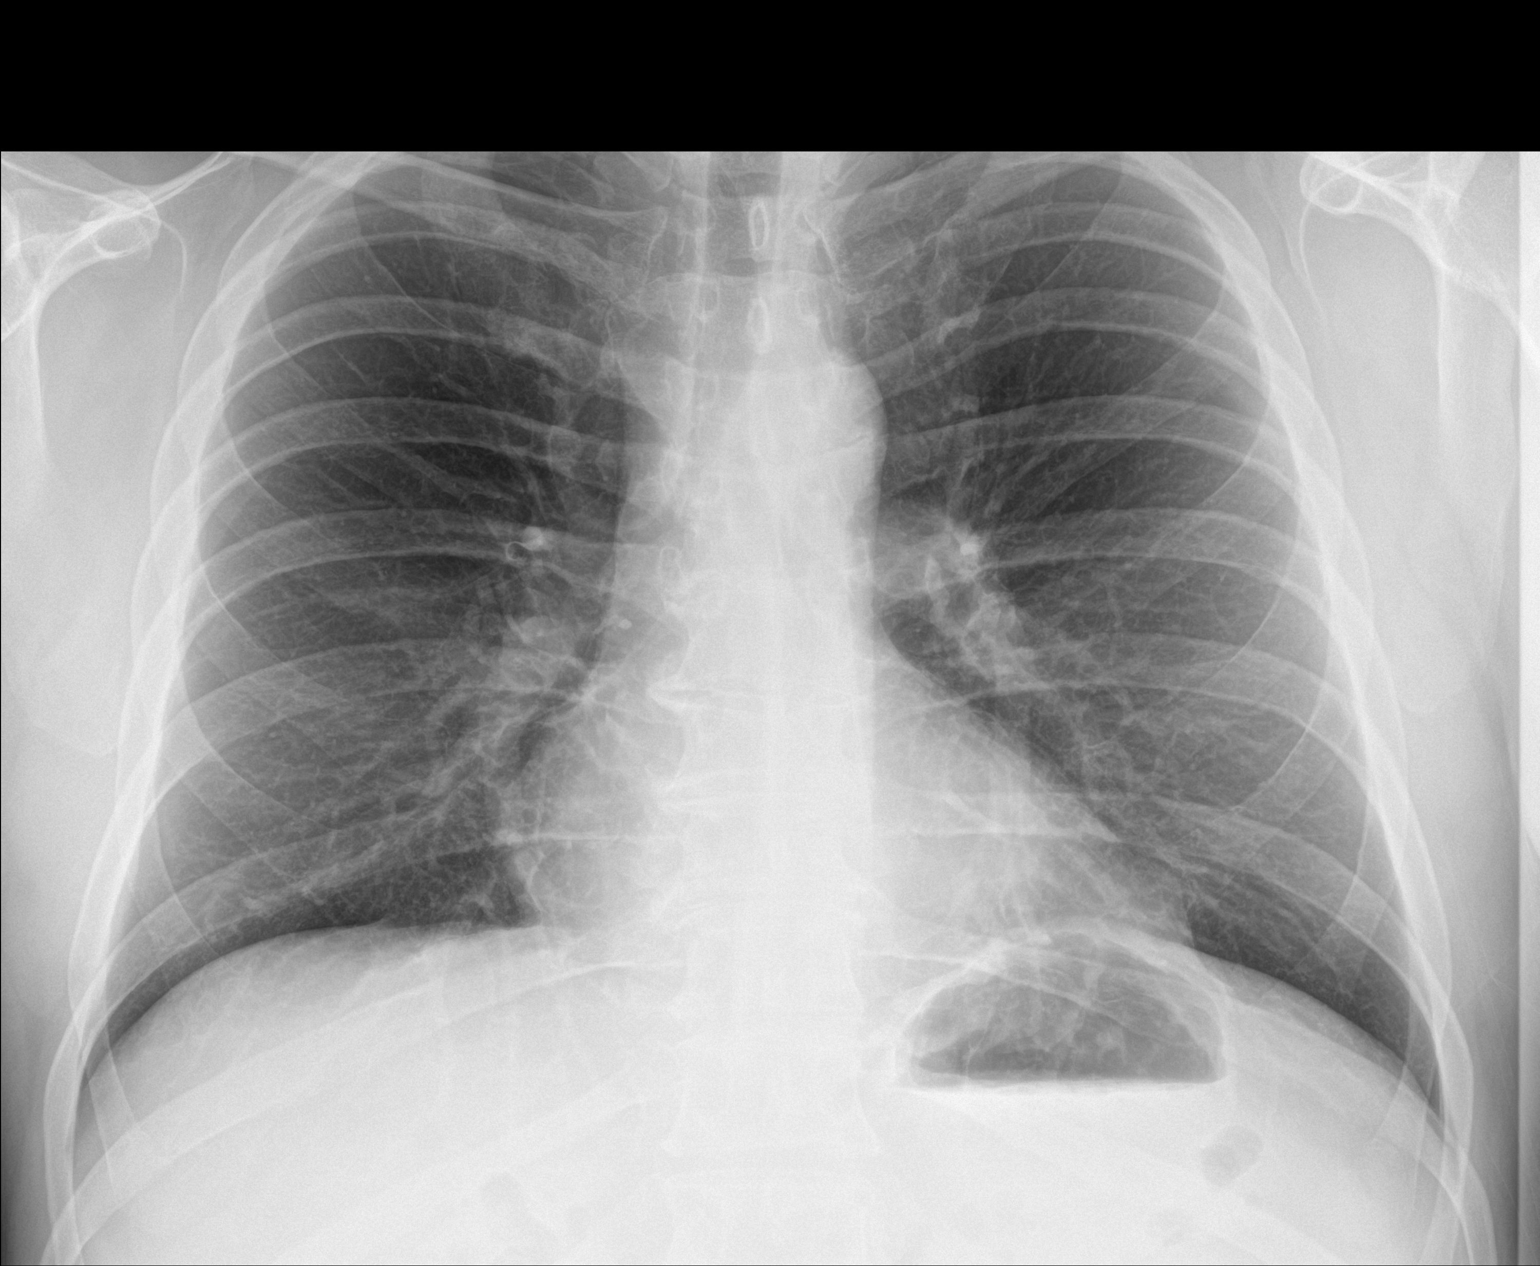

[chest lat (1 of 2)]
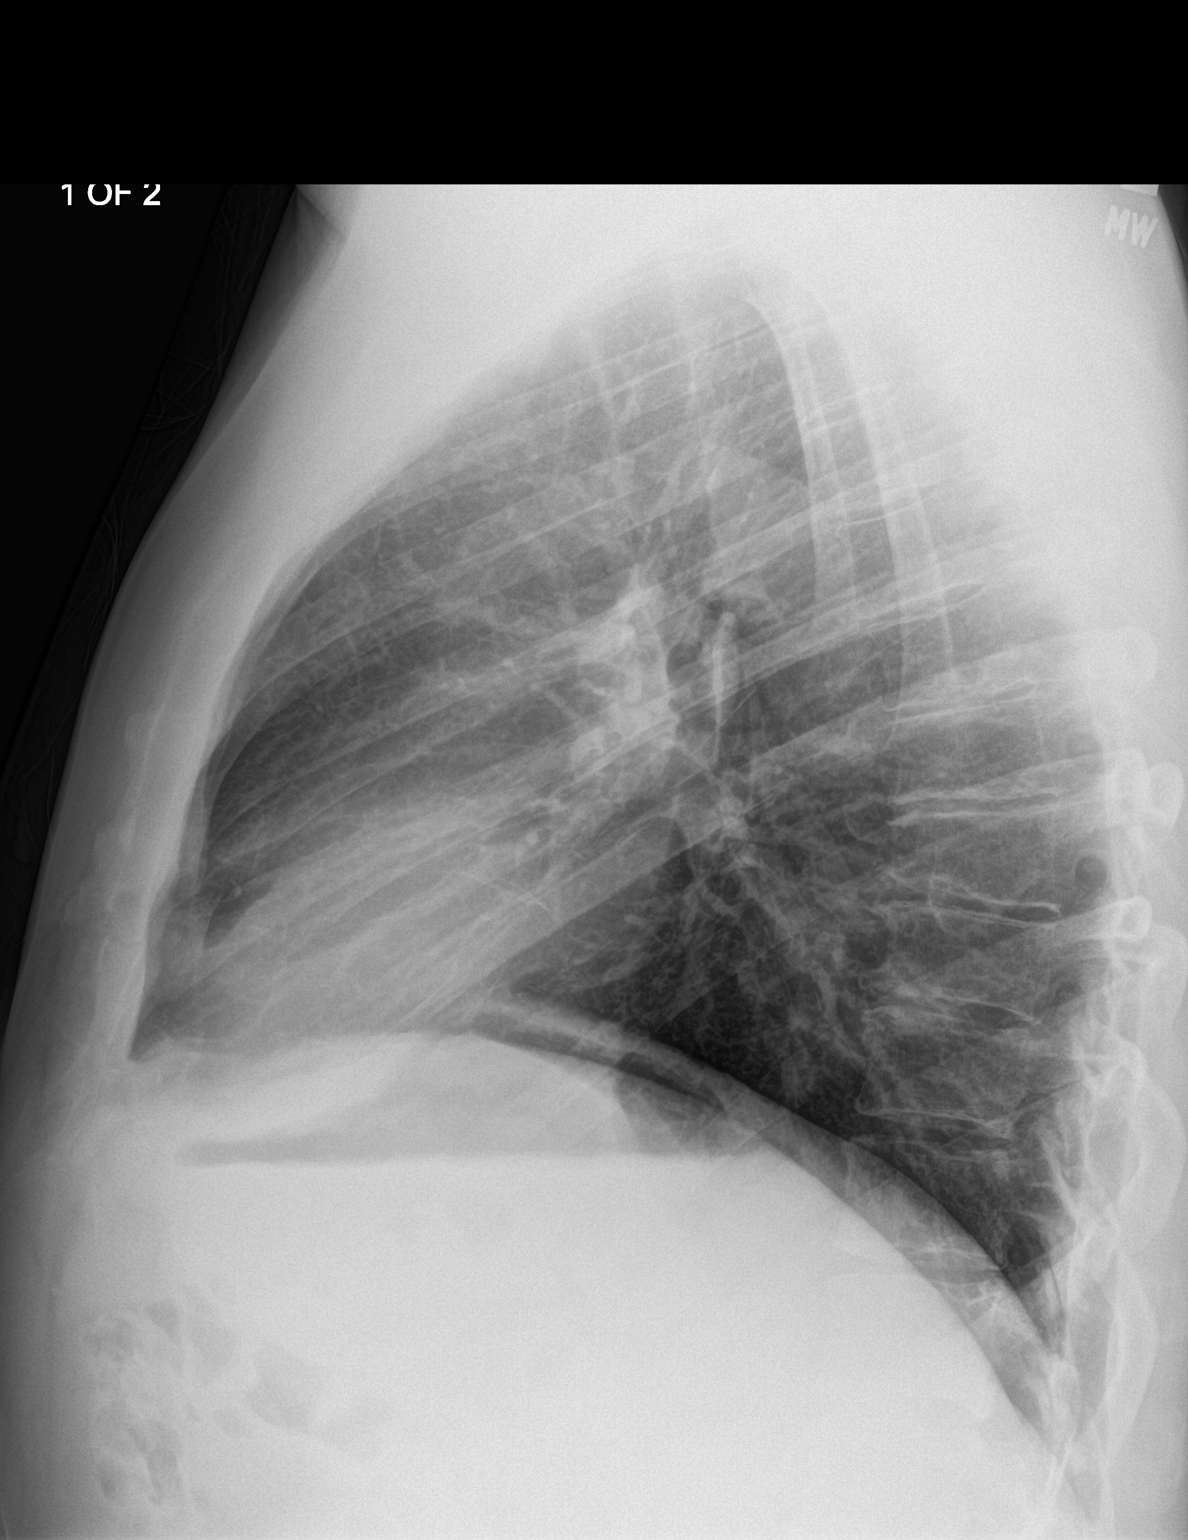

[chest lat (2 of 2)]
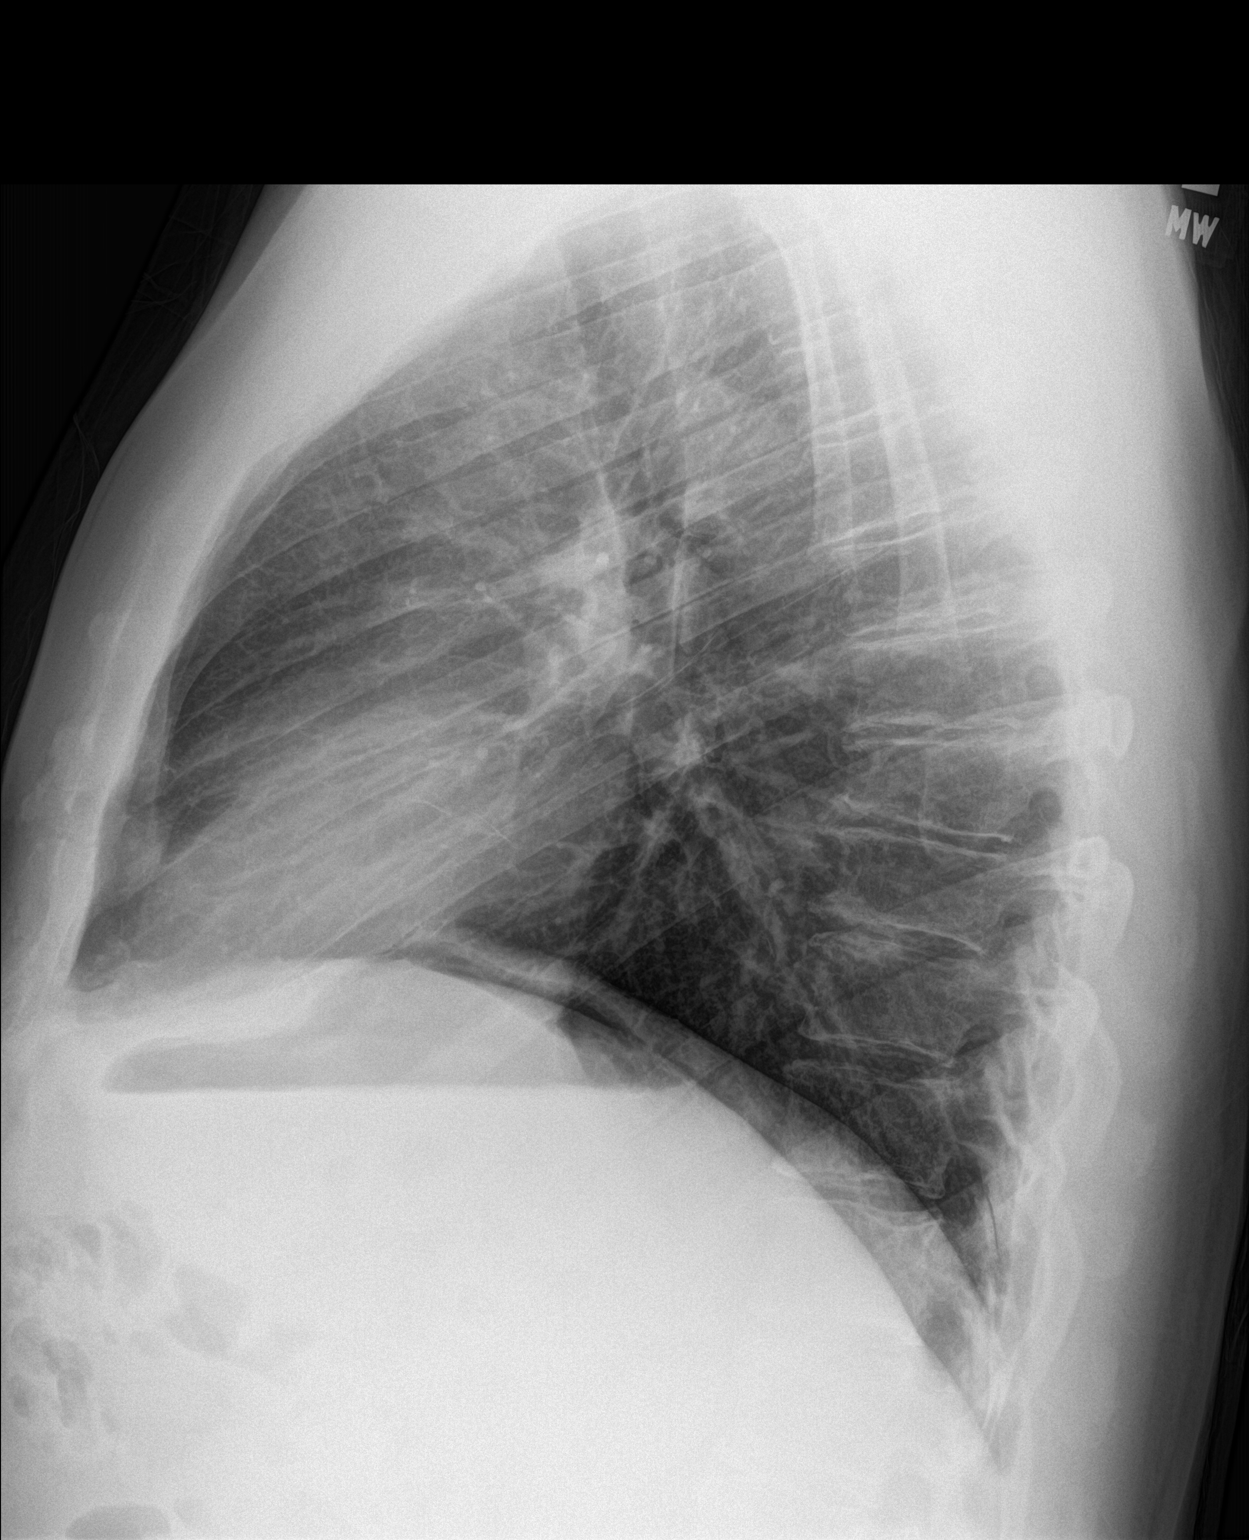

[3 of 3 positions shown; findings below may reference images not displayed]

FINDINGS: Midline trachea. Normal heart size and mediastinal contours. No
pleural effusion or pneumothorax. Clear lungs.
IMPRESSION: No acute cardiopulmonary disease.

## 2018-03-23 ENCOUNTER — Encounter

## 2018-03-23 ENCOUNTER — Encounter: Payer: Self-pay | Admitting: Family Medicine

## 2018-07-07 ENCOUNTER — Ambulatory Visit (INDEPENDENT_AMBULATORY_CARE_PROVIDER_SITE_OTHER): Payer: Self-pay | Admitting: Family Medicine

## 2018-07-07 ENCOUNTER — Other Ambulatory Visit: Payer: Self-pay

## 2018-07-07 ENCOUNTER — Encounter: Payer: Self-pay | Admitting: Family Medicine

## 2018-07-07 DIAGNOSIS — M19042 Primary osteoarthritis, left hand: Secondary | ICD-10-CM

## 2018-07-07 DIAGNOSIS — M1000 Idiopathic gout, unspecified site: Secondary | ICD-10-CM

## 2018-07-07 DIAGNOSIS — M19041 Primary osteoarthritis, right hand: Secondary | ICD-10-CM

## 2018-07-07 DIAGNOSIS — I10 Essential (primary) hypertension: Secondary | ICD-10-CM

## 2018-07-07 MED ORDER — MELOXICAM 15 MG PO TABS: 15.0000 mg | ORAL_TABLET | Freq: Every day | ORAL | 3 refills | Status: DC

## 2018-07-07 MED ORDER — LISINOPRIL 20 MG PO TABS: 20.0000 mg | ORAL_TABLET | Freq: Every day | ORAL | 4 refills | Status: DC

## 2018-07-07 NOTE — Assessment & Plan Note (Signed)
Discussed gout care and treatment meloxicam should help.

## 2018-07-07 NOTE — Assessment & Plan Note (Signed)
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

## 2018-07-07 NOTE — Progress Notes (Signed)
   There were no vitals taken for this visit.   Subjective:    Patient ID: Kurt Mendoza, male    DOB: 02-26-1960, 58 y.o.   MRN: [id number]  HPI: Kurt Mendoza is a 58 y.o. male  arthritis  Telemedicine using audio/video telecommunications for a synchronous communication visit. Today's visit due to COVID-19 isolation precautions I connected with and verified that I am speaking with the correct person using two identifiers.   I discussed the limitations, risks, security and privacy concerns of performing an evaluation and management service by telecommunication and the availability of in person appointments. I also discussed with the patient that there may be a patient responsible charge related to this service. The patient expressed understanding and agreed to proceed. The patient's location is work. I am at home.  Discussed with patient concerns about arthritis especially in both hands and right middle finger MIP joint is swollen tender cannot make a adequate fist with his hand at all.  This is been ongoing for months and months has taken Advil Aleve with some relief but still has difficulty with nighttime getting settled in and pain-free.  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 Patient does computer work note hard manual work. Blood pressure stable. Did have a gout flare this week but otherwise does well.  Relevant past medical, surgical, family and social history reviewed and updated as indicated. Interim medical history since our last visit reviewed. Allergies and medications reviewed and updated.  Review of Systems  Constitutional: Negative.   Respiratory: Negative.   Cardiovascular: Negative.     Per HPI unless specifically indicated above     Objective:    There were no vitals taken for this visit.  Wt Readings from Last 3 Encounters:  02/02/18 237 lb 14.4 oz (107.9 kg)  10/30/17 225 lb (102.1 kg)  02/07/16 233 lb 6.4  oz (105.9 kg)    Physical Exam      Assessment & Plan:   Problem List Items Addressed This Visit      Cardiovascular and Mediastinum   Hypertension    The current medical regimen is effective;  continue present plan and medications.       Relevant Medications   lisinopril (ZESTRIL) 20 MG tablet     Musculoskeletal and Integument   Arthritis of both hands    Discussed arthritis of both hands most likely osteo-arthritis will try meloxicam if not helpful will consider referral to rheumatology to further evaluate.      Relevant Medications   meloxicam (MOBIC) 15 MG tablet     Other   Gout    Discussed gout care and treatment meloxicam should help.         I discussed the assessment and treatment plan with the patient. The patient was provided an opportunity to ask questions and all were answered. The patient agreed with the plan and demonstrated an understanding of the instructions.   The patient was advised to call back or seek an in-person evaluation if the symptoms worsen or if the condition fails to improve as anticipated.   I provided 21+ minutes of time during this encounter. Follow up plan: Return if symptoms worsen or fail to improve, for As scheduled.

## 2018-07-07 NOTE — Assessment & Plan Note (Signed)
The current medical regimen is effective;  continue present plan and medications.  

## 2018-11-06 ENCOUNTER — Other Ambulatory Visit: Payer: Self-pay | Admitting: Family Medicine

## 2018-11-06 NOTE — Telephone Encounter (Signed)
Requested medication (s) are due for refill today: yes  Requested medication (s) are on the active medication list: yes  Last refill:  10/06/2018  Future visit scheduled:no  Notes to clinic:  Review for refill   Requested Prescriptions  Pending Prescriptions Disp Refills   meloxicam (MOBIC) 15 MG tablet [Pharmacy Med Name: MELOXICAM 15MG  TABLETS] 30 tablet 3    Sig: TAKE 1 TABLET(15 MG) BY MOUTH DAILY     Analgesics:  COX2 Inhibitors Passed - 11/06/2018 10:18 AM      Passed - HGB in normal range and within 360 days    Hemoglobin  Date Value Ref Range Status  02/01/2018 16.6 13.0 - 17.0 g/dL Final         Passed - Cr in normal range and within 360 days    Creatinine, Ser  Date Value Ref Range Status  02/01/2018 0.89 0.61 - 1.24 mg/dL Final         Passed - Patient is not pregnant      Passed - Valid encounter within last 12 months    Recent Outpatient Visits          4 months ago Arthritis of both hands   [facility] [place], Jeannette How, MD   2 years ago Difficulty breathing   [place], Jeannette How, MD   3 years ago Near syncope   [place], Jeannette How, MD   3 years ago Essential hypertension   [facility] [place], Jeannette How, MD

## 2018-11-06 NOTE — Telephone Encounter (Signed)
Routing to provider  

## 2018-11-12 ENCOUNTER — Other Ambulatory Visit: Payer: Self-pay | Admitting: *Deleted

## 2018-11-12 DIAGNOSIS — Z20822 Contact with and (suspected) exposure to covid-19: Secondary | ICD-10-CM

## 2018-11-13 LAB — NOVEL CORONAVIRUS, NAA: SARS-CoV-2, NAA: NOT DETECTED

## 2018-11-16 ENCOUNTER — Telehealth: Payer: Self-pay | Admitting: Family Medicine

## 2018-11-16 NOTE — Telephone Encounter (Signed)
Patient called in for Covid test results.  He was told that Covid was Not Detected.

## 2018-12-07 ENCOUNTER — Other Ambulatory Visit: Payer: Self-pay

## 2018-12-07 DIAGNOSIS — Z20822 Contact with and (suspected) exposure to covid-19: Secondary | ICD-10-CM

## 2018-12-09 LAB — NOVEL CORONAVIRUS, NAA: SARS-CoV-2, NAA: NOT DETECTED

## 2019-02-08 ENCOUNTER — Other Ambulatory Visit: Payer: Self-pay

## 2019-02-08 NOTE — Telephone Encounter (Signed)
Patient last seen 07/07/18

## 2019-02-09 MED ORDER — LISINOPRIL 20 MG PO TABS: 20.0000 mg | ORAL_TABLET | Freq: Every day | ORAL | 0 refills | Status: DC

## 2019-02-22 ENCOUNTER — Other Ambulatory Visit: Payer: Self-pay

## 2019-02-22 NOTE — Telephone Encounter (Signed)
Needs appointment

## 2019-02-22 NOTE — Telephone Encounter (Signed)
[facility] faxed Rx refill request on meloxicam 15 mg tablets

## 2019-02-23 MED ORDER — MELOXICAM 15 MG PO TABS: ORAL_TABLET | ORAL | 0 refills | Status: DC

## 2019-02-23 NOTE — Telephone Encounter (Signed)
Called and spoke with patient. Scheduled for 03/02/19 at 3 pm

## 2019-03-02 ENCOUNTER — Ambulatory Visit: Payer: Self-pay | Admitting: Family Medicine

## 2019-03-02 ENCOUNTER — Ambulatory Visit (INDEPENDENT_AMBULATORY_CARE_PROVIDER_SITE_OTHER): Payer: BC Managed Care – PPO | Admitting: Nurse Practitioner

## 2019-03-02 ENCOUNTER — Encounter: Payer: Self-pay | Admitting: Nurse Practitioner

## 2019-03-02 ENCOUNTER — Inpatient Hospital Stay: Admit: 2019-03-02 | Payer: Self-pay

## 2019-03-02 ENCOUNTER — Ambulatory Visit
Admission: RE | Admit: 2019-03-02 | Discharge: 2019-03-02 | Disposition: A | Discharge status: Home / Self Care | Payer: BC Managed Care – PPO | Source: Ambulatory Visit | Attending: Nurse Practitioner | Admitting: Nurse Practitioner

## 2019-03-02 ENCOUNTER — Other Ambulatory Visit: Payer: Self-pay

## 2019-03-02 VITALS — BP 146/76 | HR 75 | Temp 98.0°F

## 2019-03-02 DIAGNOSIS — M19042 Primary osteoarthritis, left hand: Secondary | ICD-10-CM

## 2019-03-02 DIAGNOSIS — M19041 Primary osteoarthritis, right hand: Secondary | ICD-10-CM

## 2019-03-02 DIAGNOSIS — M542 Cervicalgia: Secondary | ICD-10-CM | POA: Diagnosis not present

## 2019-03-02 DIAGNOSIS — I10 Essential (primary) hypertension: Secondary | ICD-10-CM

## 2019-03-02 MED ORDER — MELOXICAM 15 MG PO TABS: ORAL_TABLET | ORAL | 0 refills | Status: DC

## 2019-03-02 MED ORDER — LISINOPRIL 20 MG PO TABS: 20.0000 mg | ORAL_TABLET | Freq: Every day | ORAL | 3 refills | Status: DC

## 2019-03-02 NOTE — Assessment & Plan Note (Signed)
Chronic, ongoing.  BP elevated in office today, but home BP at goal.  Will refill Lisinopril and continue current dose, adjust as needed.  CMP today. Return in 6 months for annual physical.

## 2019-03-02 NOTE — Assessment & Plan Note (Addendum)
With chronic neck pain.  Will obtain imaging of neck today and labs uric acid, CRP, ESR, CMP.  Recommend not to tale Ibuprofen and Meloxicam, to take Meloxicam only and minimally.  Recommend use of Tylenol as needed for arthritis pain + Voltaren gel.  Consider referral to PT and ortho if ongoing discomfort and dependent on imaging.  Return if worsening or continued pain.

## 2019-03-02 NOTE — Patient Instructions (Signed)

## 2019-03-02 NOTE — Progress Notes (Signed)
BP (!) 146/76 (BP Location: Right Arm, Patient Position: Sitting, Cuff Size: Normal)   Pulse 75   Temp 98 F (36.7 C) (Oral)   SpO2 96%    Subjective:    Patient ID: Kurt Mendoza, male    DOB: [birth year], 59 y.o.   MRN: [id number]  HPI: Kurt Mendoza is a 59 y.o. male  Chief Complaint  Patient presents with  . Medication Refill   HYPERTENSION Continues on Lisinopril 20 MG daily. Hypertension status: stable  Satisfied with current treatment? yes Duration of hypertension: chronic BP monitoring frequency:  a few times a week BP range: 110/70 on average at home BP medication side effects:  no Medication compliance: good compliance Aspirin: no Recurrent headaches: no Visual changes: no Palpitations: no Dyspnea: no Chest pain: no Lower extremity edema: no Dizzy/lightheaded: no   ARTHRITIS OF HANDS BILATERALLY: Has issue with neck and hands, arthritis.  Takes Meloxicam every day.  Has been taking along with Ibuprofen.  Reports this as an ongoing issue, which he feels is worsening. Duration: years Involved hand: bilateral Mechanism of injury: arthritis Location: diffuse Onset: gradual Severity: moderate  Quality: dull and aching Frequency: intermittent Radiation: no Aggravating factors: movement Alleviating factors: warm towels and Meloxicam Treatments attempted:  Relief with NSAIDs?: mild Weakness: yes Numbness: no Redness: no Swelling:yes Bruising: no Fevers: no  Relevant past medical, surgical, family and social history reviewed and updated as indicated. Interim medical history since our last visit reviewed. Allergies and medications reviewed and updated.  Review of Systems  Constitutional: Negative for activity change, diaphoresis, fatigue and fever.  Respiratory: Negative for cough, chest tightness, shortness of breath and wheezing.   Cardiovascular: Negative for chest pain, palpitations and leg swelling.  Musculoskeletal: Positive for arthralgias and  neck pain.  Psychiatric/Behavioral: Negative.     Per HPI unless specifically indicated above     Objective:    BP (!) 146/76 (BP Location: Right Arm, Patient Position: Sitting, Cuff Size: Normal)   Pulse 75   Temp 98 F (36.7 C) (Oral)   SpO2 96%   Wt Readings from Last 3 Encounters:  02/02/18 237 lb 14.4 oz (107.9 kg)  10/30/17 225 lb (102.1 kg)  02/07/16 233 lb 6.4 oz (105.9 kg)    Physical Exam Vitals and nursing note reviewed.  Constitutional:      General: He is awake. He is not in acute distress.    Appearance: He is well-developed. He is not ill-appearing.  HENT:     Head: Normocephalic and atraumatic.     Right Ear: Hearing normal. No drainage.     Left Ear: Hearing normal. No drainage.  Eyes:     General: Lids are normal.        Right eye: No discharge.        Left eye: No discharge.     Conjunctiva/sclera: Conjunctivae normal.     Pupils: Pupils are equal, round, and reactive to light.  Neck:     Vascular: No carotid bruit.     Trachea: Trachea normal.  Cardiovascular:     Rate and Rhythm: Normal rate and regular rhythm.     Heart sounds: Normal heart sounds, S1 normal and S2 normal. No murmur. No gallop.   Pulmonary:     Effort: Pulmonary effort is normal. No accessory muscle usage or respiratory distress.     Breath sounds: Normal breath sounds.  Abdominal:     General: Bowel sounds are normal.     Palpations: Abdomen is soft.  Musculoskeletal:        General: Normal range of motion.     Right upper arm: Normal.     Left upper arm: Normal.     Right hand: Swelling (at knuckles) present. No tenderness. Normal range of motion. Normal strength. Normal sensation.     Left hand: Swelling (at knuckles) present. No tenderness. Normal range of motion. Normal strength. Normal sensation.     Cervical back: Normal range of motion and neck supple. No edema, erythema or signs of trauma. No spinous process tenderness or muscular tenderness. Normal range of motion.      Right lower leg: No edema.     Left lower leg: No edema.     Comments: No rashes noted.  Skin:    General: Skin is warm and dry.     Capillary Refill: Capillary refill takes less than 2 seconds.     Findings: No rash.  Neurological:     Mental Status: He is alert and oriented to person, place, and time.     Deep Tendon Reflexes: Reflexes are normal and symmetric.  Psychiatric:        Mood and Affect: Mood normal.        Behavior: Behavior normal. Behavior is cooperative.        Thought Content: Thought content normal.        Judgment: Judgment normal.    Results for orders placed or performed in visit on 12/07/18  Novel Coronavirus, NAA ([facility])   Specimen: Nasopharyngeal(NP) swabs in vial transport medium   NASOPHARYNGE  TESTING  Result Value Ref Range   SARS-CoV-2, NAA Not Detected Not Detected      Assessment & Plan:   Problem List Items Addressed This Visit      Cardiovascular and Mediastinum   Hypertension - Primary    Chronic, ongoing.  BP elevated in office today, but home BP at goal.  Will refill Lisinopril and continue current dose, adjust as needed.  CMP today. Return in 6 months for annual physical.      Relevant Medications   lisinopril (ZESTRIL) 20 MG tablet     Musculoskeletal and Integument   Arthritis of both hands    With chronic neck pain.  Will obtain imaging of neck today and labs uric acid, CRP, ESR, CMP.  Recommend not to tale Ibuprofen and Meloxicam, to take Meloxicam only and minimally.  Recommend use of Tylenol as needed for arthritis pain + Voltaren gel.  Consider referral to PT and ortho if ongoing discomfort and dependent on imaging.  Return if worsening or continued pain.      Relevant Medications   meloxicam (MOBIC) 15 MG tablet   Other Relevant Orders   DG Cervical Spine Complete (Completed)   Comprehensive metabolic panel   Uric acid   Sed Rate (ESR)   C-reactive protein       Follow up plan: Return in about 6 months (around  08/30/2019) for Annual physical.

## 2019-03-02 NOTE — Progress Notes (Signed)
Contacted via MyChart

## 2019-03-03 ENCOUNTER — Other Ambulatory Visit: Payer: Self-pay | Admitting: Nurse Practitioner

## 2019-03-03 DIAGNOSIS — M542 Cervicalgia: Secondary | ICD-10-CM

## 2019-03-03 DIAGNOSIS — G8929 Other chronic pain: Secondary | ICD-10-CM | POA: Insufficient documentation

## 2019-03-03 LAB — COMPREHENSIVE METABOLIC PANEL
ALT: 57 IU/L — ABNORMAL HIGH (ref 0–44)
AST: 30 IU/L (ref 0–40)
Albumin/Globulin Ratio: 1.7 (ref 1.2–2.2)
Albumin: 4.6 g/dL (ref 3.8–4.9)
Alkaline Phosphatase: 63 IU/L (ref 39–117)
BUN/Creatinine Ratio: 21 — ABNORMAL HIGH (ref 9–20)
BUN: 20 mg/dL (ref 6–24)
Bilirubin Total: 0.6 mg/dL (ref 0.0–1.2)
CO2: 19 mmol/L — ABNORMAL LOW (ref 20–29)
Calcium: 9.8 mg/dL (ref 8.7–10.2)
Chloride: 104 mmol/L (ref 96–106)
Creatinine, Ser: 0.96 mg/dL (ref 0.76–1.27)
GFR calc Af Amer: 100 mL/min/{1.73_m2} (ref >59)
GFR calc non Af Amer: 86 mL/min/{1.73_m2} (ref >59)
Globulin, Total: 2.7 g/dL (ref 1.5–4.5)
Glucose: 104 mg/dL — ABNORMAL HIGH (ref 65–99)
Potassium: 4.3 mmol/L (ref 3.5–5.2)
Sodium: 141 mmol/L (ref 134–144)
Total Protein: 7.3 g/dL (ref 6.0–8.5)

## 2019-03-03 LAB — SEDIMENTATION RATE: Sed Rate: 5 mm/hr (ref 0–30)

## 2019-03-03 LAB — URIC ACID: Uric Acid: 8.8 mg/dL — ABNORMAL HIGH (ref 3.8–8.4)

## 2019-03-03 LAB — C-REACTIVE PROTEIN: CRP: <1 mg/L (ref 0–10)

## 2019-03-03 MED ORDER — ALLOPURINOL 100 MG PO TABS: 100.0000 mg | ORAL_TABLET | Freq: Every day | ORAL | 2 refills | Status: DC

## 2019-03-30 ENCOUNTER — Ambulatory Visit: Payer: BC Managed Care – PPO | Admitting: Nurse Practitioner

## 2019-04-23 ENCOUNTER — Encounter: Payer: Self-pay | Admitting: Nurse Practitioner

## 2019-05-12 ENCOUNTER — Encounter: Payer: Self-pay | Admitting: Nurse Practitioner

## 2019-06-23 ENCOUNTER — Encounter: Payer: Self-pay | Admitting: Nurse Practitioner

## 2019-06-23 DIAGNOSIS — M542 Cervicalgia: Secondary | ICD-10-CM

## 2019-06-23 DIAGNOSIS — G8929 Other chronic pain: Secondary | ICD-10-CM

## 2019-07-01 DIAGNOSIS — M13841 Other specified arthritis, right hand: Secondary | ICD-10-CM | POA: Diagnosis not present

## 2019-07-01 DIAGNOSIS — M189 Osteoarthritis of first carpometacarpal joint, unspecified: Secondary | ICD-10-CM | POA: Diagnosis not present

## 2019-07-01 DIAGNOSIS — M79645 Pain in left finger(s): Secondary | ICD-10-CM | POA: Diagnosis not present

## 2019-09-13 ENCOUNTER — Encounter: Payer: BC Managed Care – PPO | Admitting: Nurse Practitioner

## 2019-09-24 ENCOUNTER — Encounter: Payer: Self-pay | Admitting: Family Medicine

## 2019-09-24 ENCOUNTER — Ambulatory Visit (INDEPENDENT_AMBULATORY_CARE_PROVIDER_SITE_OTHER): Payer: BC Managed Care – PPO | Admitting: Family Medicine

## 2019-09-24 ENCOUNTER — Other Ambulatory Visit: Payer: Self-pay

## 2019-09-24 VITALS — BP 154/78 | HR 86 | Temp 98.7°F | Ht 73.0 in | Wt 237.0 lb

## 2019-09-24 DIAGNOSIS — M109 Gout, unspecified: Secondary | ICD-10-CM | POA: Diagnosis not present

## 2019-09-24 DIAGNOSIS — I10 Essential (primary) hypertension: Secondary | ICD-10-CM

## 2019-09-24 DIAGNOSIS — M1A0720 Idiopathic chronic gout, left ankle and foot, without tophus (tophi): Secondary | ICD-10-CM

## 2019-09-24 MED ORDER — PREDNISONE 20 MG PO TABS: ORAL_TABLET | ORAL | 0 refills | Status: DC

## 2019-09-24 MED ORDER — LOSARTAN POTASSIUM 50 MG PO TABS: 50.0000 mg | ORAL_TABLET | Freq: Every day | ORAL | 3 refills | Status: DC

## 2019-09-24 NOTE — Progress Notes (Addendum)
Subjective:    Patient ID: Kurt Mendoza, male    DOB: Oct 28, 1960, 59 y.o.   MRN: [id number]  Kurt Mendoza is a 59 y.o. male presenting on 09/24/2019 for Establish Care and Gout (left ankle onset 3 days got improved and now pain is back and time before was Right ankle for  2 weeks )  New patient. Previous Dr Jeananne Rama [facility]  Wife, Lattie Haw, here as well for additional history.  Initially planned for establish visit, but today has acute complaint. Focus of visit was acute issue with gout flare.  HPI   Gout Flare, Acute on chronic / Ankle and Foot In past has had several minor flares, previous First onset 07/21/19 acute RIGHT ankle flare, severe pain with swelling and redness, difficulty walking, took about 2 weeks to resolve, then it improved. Then symptoms onset with LEFT ankle with severe pain and flare. - Currently seems gout flares keep recurring, and now Right still painful and sensitive. - He has tried to adjust his diet. For alcohol he would drink occasional   Describes pain even at rest. Admits difficulty with ambulation Known osteoarthritis, prior history of football player, prior joint pain previously diagnosed Uric Acid 8.8 (02/2019), in past had 5.3 (2017)  Previous meds was on colchicine PRN, but intolerance due to side effect, remains off this med. Also was on Allopurinol 148m daily from prior PCP in past. 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 Tolerating well, w/o complaints. Denies CP, dyspnea, HA, edema, dizziness / lightheadedness    Health Maintenance: Request records on prior  colon screening at upcoming routine / wellness visit  [facility]  Decreased Interest 0  Down, Depressed, Hopeless 0  PHQ - 2 Score 0    Past Medical History:  Diagnosis Date  . Esophagitis   . Gout   . History of irregular heartbeat   . Hypertension    Past Surgical History:  Procedure Laterality Date  . BACK SURGERY  1997  . KNEE SURGERY  2014  . LAPAROSCOPIC APPENDECTOMY N/A 02/02/2018   Procedure: APPENDECTOMY LAPAROSCOPIC;  Surgeon: CinHerbert PunD;  Location: [facility] ORS;  Service: General;  Laterality: N/A;   Social History   Socioeconomic History  . Marital status: Married    Spouse name: LisLattie Haw Number of children: Not on file  . Years of education: [employer] Highest education level: High school graduate  Occupational History  . Not on file  Tobacco Use  . Smoking status: Never Smoker  . Smokeless tobacco: Never Used  Vaping Use  . Vaping Use: Never used  Substance and Sexual Activity  . Alcohol use: Yes    Alcohol/week: 3.0 standard drinks    Types: 3 Standard drinks or equivalent per week    Comment: week end  . Drug use: No  . Sexual activity: Not on file  Other Topics Concern  . Not on file  Social History Narrative  . Not on file   Social Determinants of Health   Financial Resource Strain:   .  Difficulty of Paying Living Expenses:   Food Insecurity:   . Worried About Charity fundraiser in the Last Year:   . Arboriculturist in the Last Year:   Transportation Needs:   . Film/video editor (Medical):   Marland Kitchen Lack of Transportation (Non-Medical):   Physical Activity:   . Days of Exercise per Week:   . Minutes of Exercise per Session:   Stress:   . Feeling of Stress :   Social Connections:   . Frequency of Communication with Friends and Family:   . Frequency of Social Gatherings with Friends and Family:   . Attends Religious Services:   . Active Member of Clubs or Organizations:   . Attends Theatre manager Meetings:   Marland Kitchen Marital Status:   Intimate Partner Violence:   . Fear of Current or Ex-Partner:   . Emotionally Abused:   Marland Kitchen Physically Abused:   . Sexually Abused:    Family History  Problem Relation Age of Onset  . Hyperlipidemia Mother   . Stroke Father   . Hyperlipidemia Father   . Diabetes Father   . Heart disease Father   . Thyroid disease Sister   . Thyroid disease Brother    No current outpatient medications on file prior to visit.   No current facility-administered medications on file prior to visit.    Review of Systems Per HPI unless specifically indicated above     Objective:    BP (!) 154/78   Pulse 86   Temp 98.7 F (37.1 C) (Temporal)   Ht '6\' 1"'  (1.854 m)   Wt 237 lb (107.5 kg)   SpO2 96%   BMI 31.27 kg/m   Wt Readings from Last 3 Encounters:  09/24/19 237 lb (107.5 kg)  02/02/18 237 lb 14.4 oz (107.9 kg)  10/30/17 225 lb (102.1 kg)    Physical Exam Vitals and nursing note reviewed.  Constitutional:      General: He is not in acute distress.    Appearance: He is well-developed. He is not diaphoretic.     Comments: Well-appearing, comfortable, cooperative  HENT:     Head: Normocephalic and atraumatic.  Eyes:     General:        Right eye: No discharge.        Left eye: No discharge.     Conjunctiva/sclera: Conjunctivae normal.  Neck:     Thyroid: No thyromegaly.  Cardiovascular:     Rate and Rhythm: Normal rate and regular rhythm.     Heart sounds: Normal heart sounds. No murmur heard.   Pulmonary:     Effort: Pulmonary effort is normal. No respiratory distress.     Breath sounds: Normal breath sounds. No wheezing or rales.  Musculoskeletal:        General: Normal range of motion.     Cervical back: Normal range of motion and neck supple.     Right lower leg: Edema (localized edema and erythema warmth of ankle and dorsal foot, sensitive and painful to light touch or movement, consistent with gout, no sign cellulitis, no calf  swelling or asymmetry, no pitting edema) present.     Left lower leg: No edema.  Lymphadenopathy:     Cervical: No cervical adenopathy.  Skin:    General: Skin is warm and dry.     Findings: No erythema or rash.  Neurological:     Mental Status: He is alert and oriented to person, place, and time.  Psychiatric:  Behavior: Behavior normal.     Comments: Well groomed, good eye contact, normal speech and thoughts        Results for orders placed or performed in visit on 03/02/19  Comprehensive metabolic panel  Result Value Ref Range   Glucose 104 (H) 65 - 99 mg/dL   BUN 20 6 - 24 mg/dL   Creatinine, Ser 0.96 0.76 - 1.27 mg/dL   GFR calc non Af Amer 86 >59 mL/min/1.73   GFR calc Af Amer 100 >59 mL/min/1.73   BUN/Creatinine Ratio 21 (H) 9 - 20   Sodium 141 134 - 144 mmol/L   Potassium 4.3 3.5 - 5.2 mmol/L   Chloride 104 96 - 106 mmol/L   CO2 19 (L) 20 - 29 mmol/L   Calcium 9.8 8.7 - 10.2 mg/dL   Total Protein 7.3 6.0 - 8.5 g/dL   Albumin 4.6 3.8 - 4.9 g/dL   Globulin, Total 2.7 1.5 - 4.5 g/dL   Albumin/Globulin Ratio 1.7 1.2 - 2.2   Bilirubin Total 0.6 0.0 - 1.2 mg/dL   Alkaline Phosphatase 63 39 - 117 IU/L   AST 30 0 - 40 IU/L   ALT 57 (H) 0 - 44 IU/L  Uric acid  Result Value Ref Range   Uric Acid 8.8 (H) 3.8 - 8.4 mg/dL  Sed Rate (ESR)  Result Value Ref Range   Sed Rate 5 0 - 30 mm/hr  C-reactive protein  Result Value Ref Range   CRP <1 0 - 10 mg/L      Assessment & Plan:   Problem List Items Addressed This Visit    Hypertension   Relevant Medications   losartan (COZAAR) 50 MG tablet   Gout - Primary   Relevant Medications   predniSONE (DELTASONE) 20 MG tablet   Other Relevant Orders   Ambulatory referral to Rheumatology   Ambulatory referral to Rheumatology      #HTN Controlled previously on Lisinopril NO other meds tried See below, SWITCH to Losartan 53m, new rx sent for gout prevention as well F/u labs  #Gout Clinically consistent with  acute RECURRENT Left Ankle gout flare, recent onset within 1 week Prior R ankle 2 weeks ago, has recurrences within past few weeks without resolution. Severe flares compared to prior Not involving MTP. Seems chronic location for him is ankle Known long history gout for decades Prior PCP management with Allopurinol 1035mdaily, had stopped this or self discontinued when preferred to not take long term med. Failed colchicine PRN due to side effect Last uric acid 8.8 (02/2019), has increased off allopurinol Failed NSAIDs and conservative management Now difficulty ambulating unable to work  Differential Dx - unlikely trauma without known inciting injury, unlikely OA, no sign of infection or systemic symptoms. No longer on uric acid lowering therapy  Plan: 1. Start Prednisone taper over 8-12 days instructions given, counseling on potential side effects risks and benefits 2. Remain off Allopurinol for now 3. Stop colchicine, DC'd off list (no longer has med) 4. Avoid excessive ambulation, relative rest, ice if helps, can take Tylenol PRN 5. Declined work note today can write if need 6. Avoid food triggers (red meat, alcohol)  Also SWITCH Lisinopril 2064mo Losartan 66m58mo reduce risk of gout flares as additional benefit of Losartan.  Follow-up within 4 weeks once acute flare resolved, discuss uric acid lower therapy and prevention, however will proceed with Rheumatology now given chronicity of this problem.  [facility]  Will defer  Uric acid and other labs if they check them.    Orders Placed This Encounter  Procedures  . Ambulatory referral to Rheumatology    Referral Priority:   Routine    Referral Type:   Consultation    Referral Reason:   Specialty Services Required    Requested Specialty:   Rheumatology    Number of Visits Requested:   1     Meds ordered this encounter  Medications  . predniSONE (DELTASONE) 20 MG tablet    Sig: Take 2  tablets daily (63m) for 4 days, take 1 tab daily (258m for 4 days, take half tab daily (1066mfor 4 days    Dispense:  14 tablet    Refill:  0  . losartan (COZAAR) 50 MG tablet    Sig: Take 1 tablet (50 mg total) by mouth daily.    Dispense:  90 tablet    Refill:  3    Stop Lisinopril     Follow up plan: Return in about 4 weeks (around 10/22/2019) for Follow-up 4 weeks for Annual Physical (in AM, fasting, can do lab after).  AleNobie PutnamO [place] 09/24/2019, 3:14 PM

## 2019-09-24 NOTE — Patient Instructions (Addendum)
Thank you for coming to the office today.  Recommend to start taking Tylenol Extra Strength 500mg  tabs - take 1 to 2 tabs per dose (max 1000mg ) every 6-8 hours for pain (take regularly, don't skip a dose for next 7 days), max 24 hour daily dose is 6 tablets or 3000mg . In the future you can repeat the same everyday Tylenol course for 1-2 weeks at a time.   Your Left ANKLE pain is most likely caused an Acute Gout Flare - Gout is a chronic problem that will have episodic flare ups with pain, redness, swelling of a joint, most common spots are big toe, foot and ankle, knee or sometimes hands or wrists. It is caused by small crystals made of Uric Acid that form in the joint causing pain and swelling.  START Prednisone taper over next period of time.  For all gout flares, the sooner you start the medication, then the shorter the flare lasts. Go ahead and start taking Naproxen or other acute anti inflammatory like ibuprofen as soon as you get significant gout pain and swelling again in the future, and if it is not improving within 48 hours then you can follow-up at our office. OR if you don't start medication you can come to the office within 24-48 hours for treatment here.  Gout flares can repeat again soon after they resolve in the same spot or other joints, and may need repeat treatment.  Our goal is to prevent future gout flares. Try to avoid dietary triggers that are the most common causes of gout flares. - Avoid the following foods/drinks: - Red meat, organ meat (liver) - Alcohol (especially beer, also wine, liquor) - Processed foods / carbs (white bread, white rice, pasta, sugar) - Sugary drinks (sweet tea, soda) - Shellfish, shrimp / lobster  - Foods that are preferred to eat: - Beans, Lentils, Whole grains, Quinoa - Fruits, Vegetables - Dairy, Cheese, Yogurt - Soy based protein  Referral to [facility], [place]  [id number] Hours: M-Th 8-5pm / F 8-12noon Phone: [phone]   DUE for FASTING BLOOD WORK (no food or drink after midnight before the lab appointment, only water or coffee without cream/sugar on the morning of)  SCHEDULE "Lab Only" visit in the morning at the clinic for lab draw in [place]   - Make sure Lab Only appointment is at about 1 week before your next appointment, so that results will be available  For Lab Results, once available within 2-3 days of blood draw, you can can log in to MyChart online to view your results and a brief explanation. Also, we can discuss results at next follow-up visit.    Please schedule a Follow-up Appointment to: Return in about 4 weeks (around 10/22/2019) for Follow-up 4 weeks for Annual Physical (in AM, fasting, can do lab after).  If you have any other questions or concerns, please feel free to call the office or send a message through [place]. You may also schedule an earlier appointment if necessary.  Additionally, you may be receiving a survey about your experience at our office within a few days to 1 week by e-mail or mail. We value your feedback.  Nobie Putnam, DO [place]

## 2019-09-25 ENCOUNTER — Encounter: Payer: Self-pay | Admitting: Family Medicine

## 2019-09-27 ENCOUNTER — Encounter: Payer: BC Managed Care – PPO | Admitting: Nurse Practitioner

## 2020-02-07 ENCOUNTER — Ambulatory Visit: Payer: Self-pay

## 2020-02-07 ENCOUNTER — Ambulatory Visit
Admission: EM | Admit: 2020-02-07 | Discharge: 2020-02-07 | Disposition: A | Discharge status: Home / Self Care | Payer: Self-pay | Attending: Emergency Medicine | Admitting: Emergency Medicine

## 2020-02-07 DIAGNOSIS — J111 Influenza due to unidentified influenza virus with other respiratory manifestations: Secondary | ICD-10-CM

## 2020-02-07 MED ORDER — OSELTAMIVIR PHOSPHATE 75 MG PO CAPS: 75.0000 mg | ORAL_CAPSULE | Freq: Two times a day (BID) | ORAL | 0 refills | Status: AC

## 2020-02-07 MED ORDER — KETOROLAC TROMETHAMINE 60 MG/2ML IM SOLN
60.0000 mg | Freq: Once | INTRAMUSCULAR | Status: AC
  Administered 2020-02-07: 60 mg via INTRAMUSCULAR

## 2020-02-07 NOTE — ED Triage Notes (Signed)
Pt reports having body aches, fever and congestion that began yesterday. Also reports having sharp pains in lower back R side.

## 2020-02-07 NOTE — ED Provider Notes (Signed)
Kurt Mendoza    CSN: [id number] Arrival date & time: 02/07/20  1438      History   Chief Complaint Chief Complaint  Patient presents with   Appointment   Fever    HPI Kurt Mendoza is a 59 y.o. male.   [name] presents with complaints of body aches, cough, congestion which started last night and worse today. Feels similar to episode of influenza in the past. No known ill contacts. No gi symptoms. Unknown if any fevers.     ROS per HPI, negative if not otherwise mentioned.      Past Medical History:  Diagnosis Date   Esophagitis    Gout    History of irregular heartbeat    Hypertension     Patient Active Problem List   Diagnosis Date Noted   Chronic neck pain 03/03/2019   Arthritis of both hands 07/07/2018   Gout 04/04/2015   Hypertension     Past Surgical History:  Procedure Laterality Date   [place]  2014   LAPAROSCOPIC APPENDECTOMY N/A 02/02/2018   Procedure: APPENDECTOMY LAPAROSCOPIC;  Surgeon: Herbert Pun, MD;  Location: [facility] ORS;  Service: General;  Laterality: N/A;       Home Medications    Prior to Admission medications   Medication Sig Start Date End Date Taking? Authorizing Provider  losartan (COZAAR) 50 MG tablet Take 1 tablet (50 mg total) by mouth daily. 09/24/19   Karamalegos, Devonne Doughty, DO  oseltamivir (TAMIFLU) 75 MG capsule Take 1 capsule (75 mg total) by mouth every 12 (twelve) hours for 5 days. 02/07/20 02/12/20  Zigmund Gottron, NP  predniSONE (DELTASONE) 20 MG tablet Take 2 tablets daily (40mg ) for 4 days, take 1 tab daily (20mg ) for 4 days, take half tab daily (10mg ) for 4 days 09/24/19   Olin Hauser, DO    Family History Family History  Problem Relation Age of Onset   Hyperlipidemia Mother    Stroke Father    Hyperlipidemia Father    Diabetes Father    Heart disease Father    Thyroid disease Sister    Thyroid disease Brother     Social  History Social History   Tobacco Use   Smoking status: Never Smoker   Smokeless tobacco: Never Used  Scientific laboratory technician Use: Never used  Substance Use Topics   Alcohol use: Yes    Alcohol/week: 3.0 standard drinks    Types: 3 Standard drinks or equivalent per week    Comment: week end   Drug use: No     Allergies   Amlodipine and Morphine and related   Review of Systems Review of Systems   Physical Exam Triage Vital Signs ED Triage Vitals  Enc Vitals Group     BP 02/07/20 1526 (!) 155/97     Pulse Rate 02/07/20 1526 79     Resp 02/07/20 1526 16     Temp 02/07/20 1526 98.5 F (36.9 C)     Temp Source 02/07/20 1526 Oral     SpO2 02/07/20 1526 97 %     Weight 02/07/20 1527 230 lb (104.3 kg)     Height 02/07/20 1527 6\' 1"  (1.854 m)     Head Circumference --      Peak Flow --      Pain Score 02/07/20 1527 4     Pain Loc --      Pain Edu? --  Excl. in GC? --    No data found.  Updated Vital Signs BP (!) 155/97    Pulse 79    Temp 98.5 F (36.9 C) (Oral)    Resp 16    Ht 6\' 1"  (1.854 m)    Wt 230 lb (104.3 kg)    SpO2 97%    BMI 30.34 kg/m   Visual Acuity Right Eye Distance:   Left Eye Distance:   Bilateral Distance:    Right Eye Near:   Left Eye Near:    Bilateral Near:     Physical Exam Constitutional:      Appearance: He is well-developed. He is ill-appearing.  Cardiovascular:     Rate and Rhythm: Normal rate.  Pulmonary:     Effort: Pulmonary effort is normal.  Skin:    General: Skin is warm and dry.  Neurological:     Mental Status: He is alert and oriented to person, place, and time.      UC Treatments / Results  Labs (all labs ordered are listed, but only abnormal results are displayed) Labs Reviewed  COVID-19, FLU A+B NAA    EKG   Radiology No results found.  Procedures Procedures (including critical care time)  Medications Ordered in UC Medications  ketorolac (TORADOL) injection 60 mg (has no administration in  time range)    Initial Impression / Assessment and Plan / UC Course  I have reviewed the triage vital signs and the nursing notes.  Pertinent labs & imaging results that were available during my care of the patient were reviewed by me and considered in my medical decision making (see chart for details).     Non toxic. Benign physical exam.  <24 hours of symptoms. Flu/covid like, with tamiflu have and hold provided. Supportive cares recommended. Return precautions provided. Patient verbalized understanding and agreeable to plan.   Final Clinical Impressions(s) / UC Diagnoses   Final diagnoses:  Influenza-like illness     Discharge Instructions     Push fluids to ensure adequate hydration and keep secretions thin.  Tylenol and/or ibuprofen as needed for pain or fevers.  Don't take any additional ibuprofen for another 8 hours.  Over the counter medications as needed for symptoms.  I have sent tamiflu for you, you can take this if your flu test returns positive.  We call if positive, monitor your mychart for your results, however.  Return for any worsening of symptoms.  Please isolate until your symptoms return.    ED Prescriptions    Medication Sig Dispense Auth. Provider   oseltamivir (TAMIFLU) 75 MG capsule Take 1 capsule (75 mg total) by mouth every 12 (twelve) hours for 5 days. 10 capsule Zigmund Gottron, NP     PDMP not reviewed this encounter.   Zigmund Gottron, NP 02/09/20 [phone]

## 2020-02-07 NOTE — Discharge Instructions (Addendum)
Push fluids to ensure adequate hydration and keep secretions thin.  Tylenol and/or ibuprofen as needed for pain or fevers.  Don't take any additional ibuprofen for another 8 hours.  Over the counter medications as needed for symptoms.  I have sent tamiflu for you, you can take this if your flu test returns positive.  We call if positive, monitor your mychart for your results, however.  Return for any worsening of symptoms.  Please isolate until your symptoms return.

## 2020-02-08 ENCOUNTER — Telehealth: Payer: BC Managed Care – PPO | Admitting: Family Medicine

## 2020-02-09 LAB — COVID-19, FLU A+B NAA
Influenza A, NAA: NOT DETECTED
Influenza B, NAA: NOT DETECTED
SARS-CoV-2, NAA: NOT DETECTED

## 2020-03-08 IMAGING — CT CT ABD-PELV W/ CM
2 of 5 series · 15 of 46 positions shown, 17 images · IV contrast (APPLIED)
Comparison: CT 05/08/2012

CLINICAL DATA: Right upper/right lower quadrant pain. Pain onset
today.

EXAM:
CT ABDOMEN AND PELVIS WITH CONTRAST
TECHNIQUE: Multidetector CT imaging of the abdomen and pelvis was performed
using the standard protocol following bolus administration of
intravenous contrast.
CONTRAST:  100mL OMNIPAQUE IOHEXOL 300 MG/ML  SOLN

[Series 2: routine abd/pel with · axial · 0.81mm/px · z∈[-880,-400]mm · 12 of 109 slices shown, 14 images]
[im 7/109  soft-tissue]
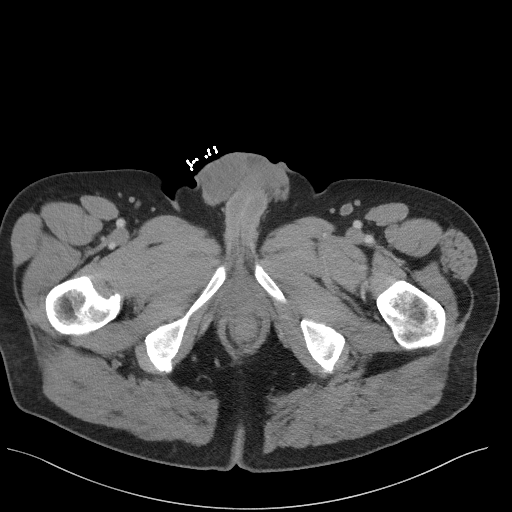
[im 7/109  bone]
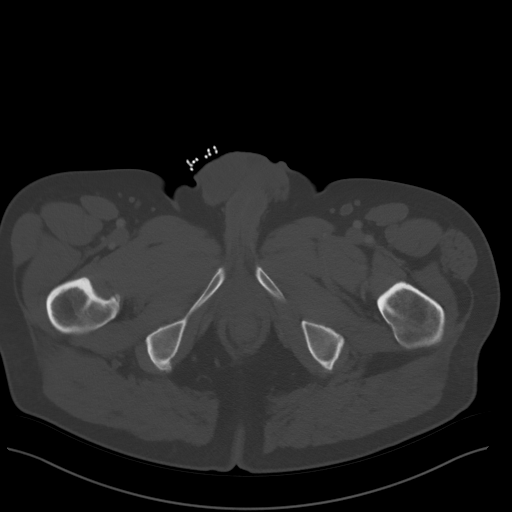
[im 19/109  soft-tissue]
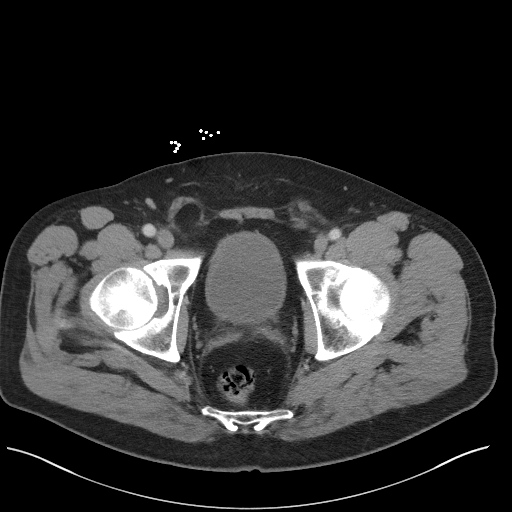
[im 25/109  soft-tissue]
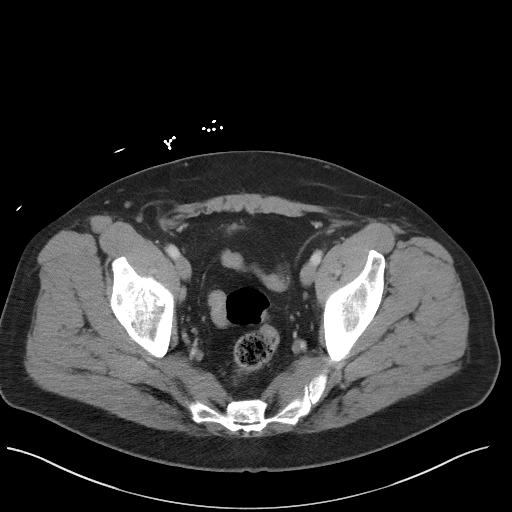
[im 31/109  soft-tissue]
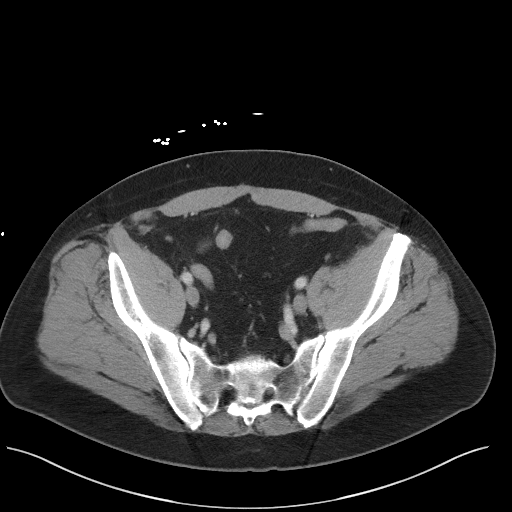
[im 43/109  soft-tissue]
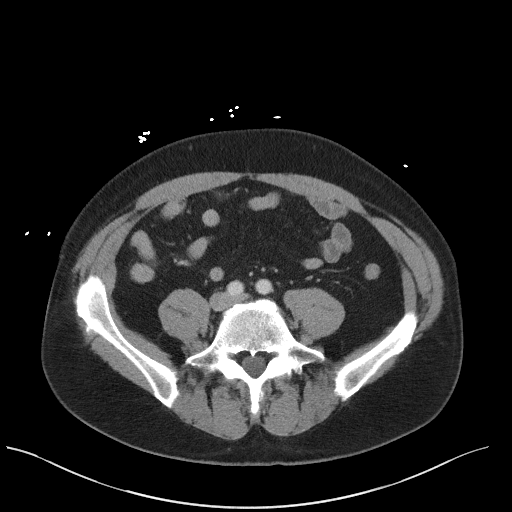
[im 49/109  soft-tissue]
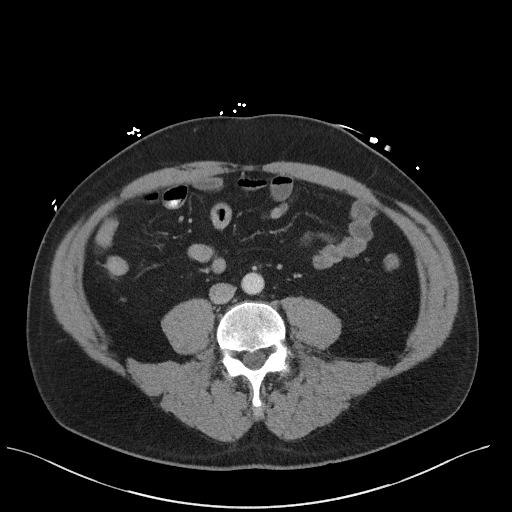
[im 61/109  soft-tissue]
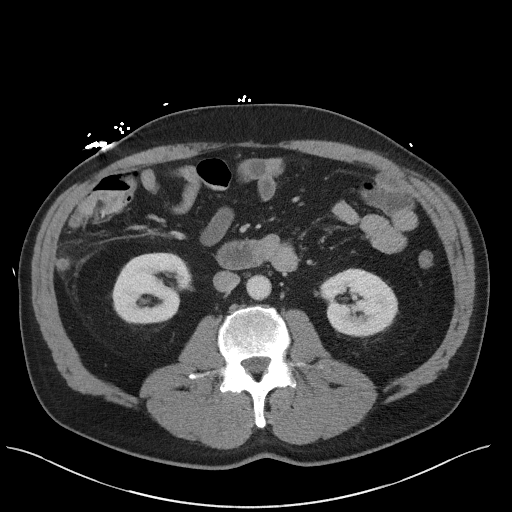
[im 67/109  soft-tissue]
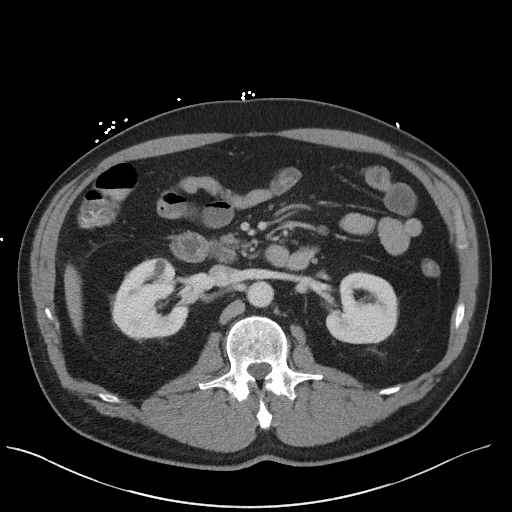
[im 79/109  soft-tissue]
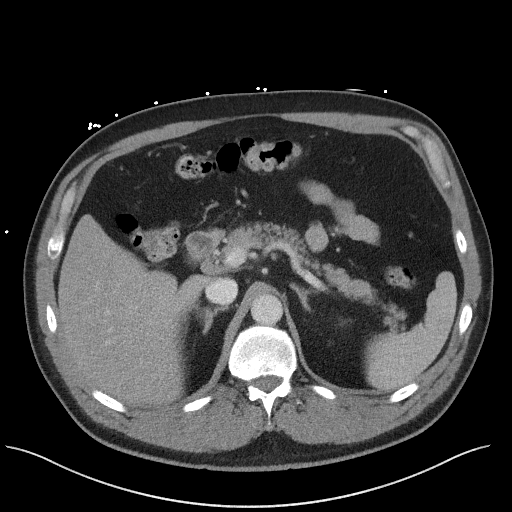
[im 79/109  bone]
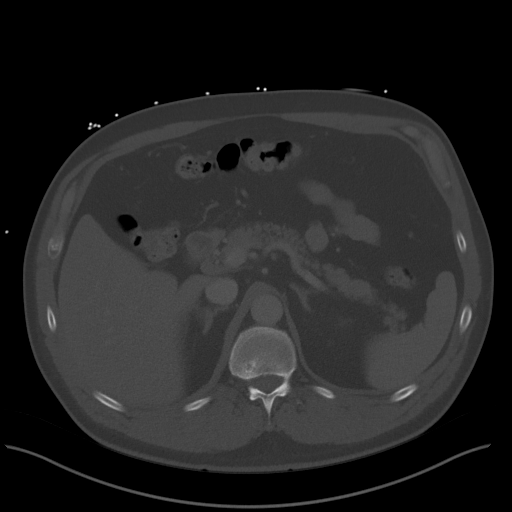
[im 85/109  soft-tissue]
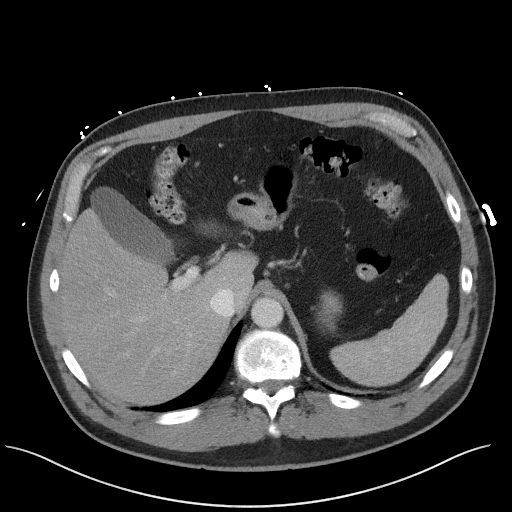
[im 91/109  soft-tissue]
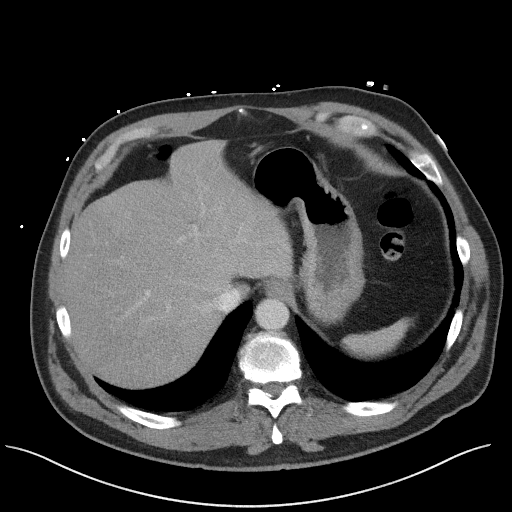
[im 103/109  soft-tissue]
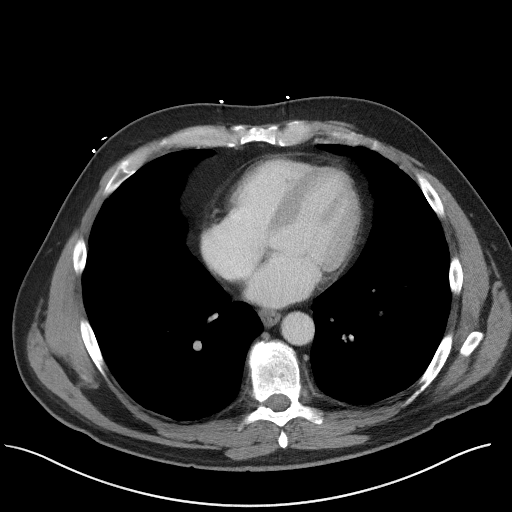

[Series 5: coronal st · coronal · 0.78mm/px · 3 of 102 slices shown]
[im 34/102  soft-tissue]
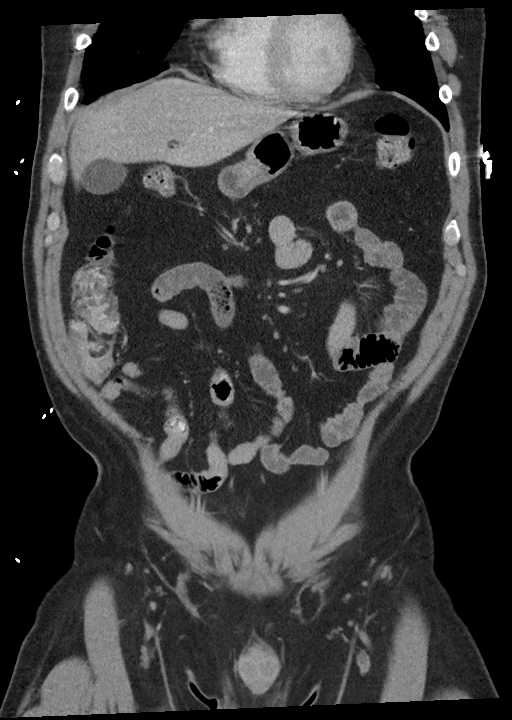
[im 45/102  soft-tissue]
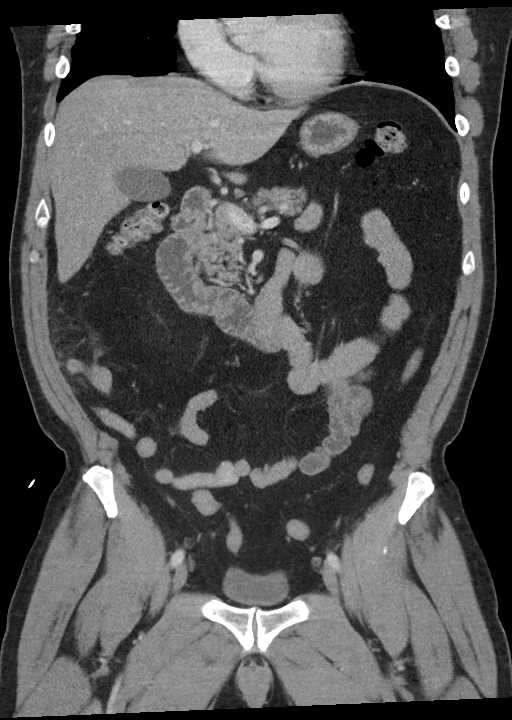
[im 57/102  soft-tissue]
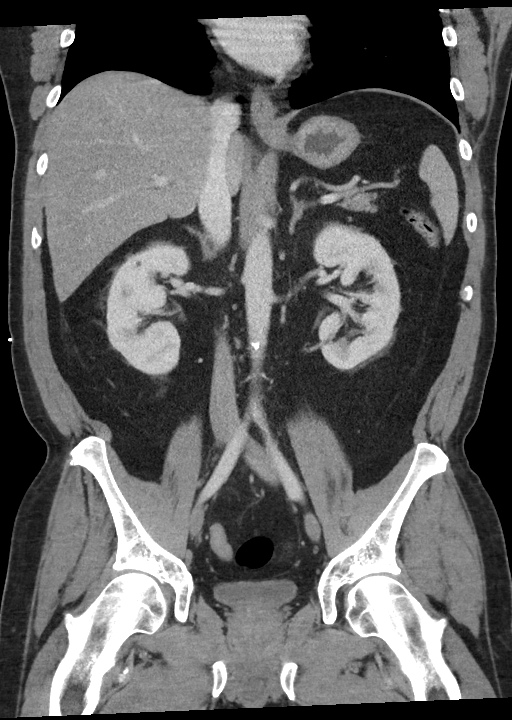

[15 of 46 positions shown; findings below may reference images not displayed]

FINDINGS: Lower chest: Mild linear atelectasis in the right middle and right
lower lobes. No pleural fluid or confluent consolidation.

Hepatobiliary: Mild hepatic steatosis. No focal hepatic lesion.
Gallbladder physiologically distended, no calcified stone. No
biliary dilatation.

Pancreas: No ductal dilatation or inflammation.

Spleen: Normal in size without focal abnormality.

Adrenals/Urinary Tract: Normal adrenal glands. No hydronephrosis or
perinephric edema. Homogeneous renal enhancement with symmetric
excretion on delayed phase imaging. Small cyst in the anterior right
kidney. Urinary bladder is partially distended without wall
thickening.

Stomach/Bowel: Acute appendicitis as described below. Stomach is
partially distended. No bowel wall thickening, inflammatory change,
or obstruction.

Appendix: Location: Retrocecal

Diameter: 10 mm

Appendicolith: No.

Mucosal hyper-enhancement: Yes

Extraluminal gas: No

Periappendiceal collection: No. Moderate periappendiceal stranding
with small amount of free fluid. No abscess.

Vascular/Lymphatic: Mild aortic atherosclerosis. No acute vascular
findings. A prominent Portacaval node is likely reactive.

Reproductive: Prostate is unremarkable.

Other: Fat within both inguinal canals. No free air.

Musculoskeletal: Degenerative disc disease at L5-S1. Scattered facet
arthropathy. There are no acute or suspicious osseous abnormalities.
IMPRESSION: 1. Uncomplicated acute appendicitis.
2. Mild hepatic steatosis.
3.  Aortic Atherosclerosis ([id number]).

## 2020-03-13 ENCOUNTER — Other Ambulatory Visit: Payer: Self-pay | Admitting: Nurse Practitioner

## 2020-04-06 DIAGNOSIS — D225 Melanocytic nevi of trunk: Secondary | ICD-10-CM | POA: Diagnosis not present

## 2020-04-06 DIAGNOSIS — D2271 Melanocytic nevi of right lower limb, including hip: Secondary | ICD-10-CM | POA: Diagnosis not present

## 2020-04-06 DIAGNOSIS — D485 Neoplasm of uncertain behavior of skin: Secondary | ICD-10-CM | POA: Diagnosis not present

## 2020-04-06 DIAGNOSIS — L820 Inflamed seborrheic keratosis: Secondary | ICD-10-CM | POA: Diagnosis not present

## 2020-04-06 DIAGNOSIS — D2261 Melanocytic nevi of right upper limb, including shoulder: Secondary | ICD-10-CM | POA: Diagnosis not present

## 2020-04-06 DIAGNOSIS — D2262 Melanocytic nevi of left upper limb, including shoulder: Secondary | ICD-10-CM | POA: Diagnosis not present

## 2020-05-02 DIAGNOSIS — M1A09X0 Idiopathic chronic gout, multiple sites, without tophus (tophi): Secondary | ICD-10-CM | POA: Diagnosis not present

## 2020-05-02 DIAGNOSIS — M8949 Other hypertrophic osteoarthropathy, multiple sites: Secondary | ICD-10-CM | POA: Diagnosis not present

## 2020-05-02 DIAGNOSIS — Z79899 Other long term (current) drug therapy: Secondary | ICD-10-CM | POA: Diagnosis not present

## 2020-05-02 DIAGNOSIS — M25541 Pain in joints of right hand: Secondary | ICD-10-CM | POA: Diagnosis not present

## 2020-05-09 DIAGNOSIS — I700 Atherosclerosis of aorta: Secondary | ICD-10-CM | POA: Insufficient documentation

## 2020-05-24 DIAGNOSIS — M1811 Unilateral primary osteoarthritis of first carpometacarpal joint, right hand: Secondary | ICD-10-CM | POA: Diagnosis not present

## 2020-05-24 DIAGNOSIS — M79644 Pain in right finger(s): Secondary | ICD-10-CM | POA: Diagnosis not present

## 2020-05-24 DIAGNOSIS — G8929 Other chronic pain: Secondary | ICD-10-CM | POA: Diagnosis not present

## 2020-07-04 DIAGNOSIS — M7989 Other specified soft tissue disorders: Secondary | ICD-10-CM | POA: Diagnosis not present

## 2020-07-04 DIAGNOSIS — M25442 Effusion, left hand: Secondary | ICD-10-CM | POA: Diagnosis not present

## 2020-07-04 DIAGNOSIS — M79644 Pain in right finger(s): Secondary | ICD-10-CM | POA: Diagnosis not present

## 2020-07-04 DIAGNOSIS — M1811 Unilateral primary osteoarthritis of first carpometacarpal joint, right hand: Secondary | ICD-10-CM | POA: Diagnosis not present

## 2020-07-04 DIAGNOSIS — M19041 Primary osteoarthritis, right hand: Secondary | ICD-10-CM | POA: Diagnosis not present

## 2020-07-04 DIAGNOSIS — M25441 Effusion, right hand: Secondary | ICD-10-CM | POA: Diagnosis not present

## 2020-08-17 ENCOUNTER — Other Ambulatory Visit: Payer: Self-pay | Admitting: Family Medicine

## 2020-08-17 DIAGNOSIS — I10 Essential (primary) hypertension: Secondary | ICD-10-CM

## 2020-08-17 MED ORDER — LOSARTAN POTASSIUM 50 MG PO TABS: 50.0000 mg | ORAL_TABLET | Freq: Every day | ORAL | 0 refills | Status: DC

## 2020-08-17 NOTE — Telephone Encounter (Signed)
Medication Refill - Medication: losartan (COZAAR) 50 MG tablet (Patient has 1 pill left) Caller would like a follow up regarding the status   Has the patient contacted their pharmacy? Yes.    (Agent: If yes, when and what did the pharmacy advise?) Contact PCP, appointment needed. Caller scheduled physical for the next available 09/18/2020.   Preferred Pharmacy (with phone number or street name):   [facility] [place], [place] Phone:  [phone]  Fax:  [phone]      Agent: Please be advised that RX refills may take up to 3 business days. We ask that you follow-up with your pharmacy.

## 2020-09-18 ENCOUNTER — Ambulatory Visit (INDEPENDENT_AMBULATORY_CARE_PROVIDER_SITE_OTHER): Payer: BC Managed Care – PPO | Admitting: Family Medicine

## 2020-09-18 ENCOUNTER — Other Ambulatory Visit: Payer: Self-pay

## 2020-09-18 ENCOUNTER — Encounter: Payer: Self-pay | Admitting: Family Medicine

## 2020-09-18 VITALS — BP 134/70 | HR 70 | Ht 73.0 in | Wt 233.8 lb

## 2020-09-18 DIAGNOSIS — M180 Bilateral primary osteoarthritis of first carpometacarpal joints: Secondary | ICD-10-CM

## 2020-09-18 DIAGNOSIS — R7309 Other abnormal glucose: Secondary | ICD-10-CM

## 2020-09-18 DIAGNOSIS — M542 Cervicalgia: Secondary | ICD-10-CM

## 2020-09-18 DIAGNOSIS — I10 Essential (primary) hypertension: Secondary | ICD-10-CM

## 2020-09-18 DIAGNOSIS — Z0000 Encounter for general adult medical examination without abnormal findings: Secondary | ICD-10-CM

## 2020-09-18 DIAGNOSIS — G8929 Other chronic pain: Secondary | ICD-10-CM

## 2020-09-18 DIAGNOSIS — M1A00X0 Idiopathic chronic gout, unspecified site, without tophus (tophi): Secondary | ICD-10-CM

## 2020-09-18 DIAGNOSIS — I700 Atherosclerosis of aorta: Secondary | ICD-10-CM

## 2020-09-18 DIAGNOSIS — Z1159 Encounter for screening for other viral diseases: Secondary | ICD-10-CM

## 2020-09-18 DIAGNOSIS — Z1211 Encounter for screening for malignant neoplasm of colon: Secondary | ICD-10-CM

## 2020-09-18 DIAGNOSIS — Z125 Encounter for screening for malignant neoplasm of prostate: Secondary | ICD-10-CM | POA: Diagnosis not present

## 2020-09-18 MED ORDER — LOSARTAN POTASSIUM 50 MG PO TABS: 50.0000 mg | ORAL_TABLET | Freq: Every day | ORAL | 3 refills | Status: DC

## 2020-09-18 NOTE — Patient Instructions (Addendum)
Thank you for coming to the office today.  Recommend to start taking Tylenol Extra Strength '500mg'$  tabs - take 1 to 2 tabs per dose (max '1000mg'$ ) every 6-8 hours for pain, max 24 hour daily dose is 6 tablets or '3000mg'$ . In the future you can repeat the same everyday Tylenol course for 1-2 weeks at a time.   If you want to also take Ibuprofen dose is 400-'600mg'$  3 times a day short term relief, not to take long term.  Recommend trial of Anti-inflammatory with Naproxen (Aleve OTC) 220 or '250mg'$  tabs - take one with food and plenty of water TWICE daily every day (breakfast and dinner), for next 1 to 2 weeks, then you may take only as needed  Call [facility] to get back on track with them, if you need a new referral, let me know.  Labs today, stay tuned for results.  Please schedule a Follow-up Appointment to: Return in about 1 year (around 09/18/2021) for 1 year Annual Physical in AM fasting lab AFTER.  If you have any other questions or concerns, please feel free to call the office or send a message through [place]. You may also schedule an earlier appointment if necessary.  Additionally, you may be receiving a survey about your experience at our office within a few days to 1 week by e-mail or mail. We value your feedback.  Nobie Putnam, DO [place]

## 2020-09-18 NOTE — Assessment & Plan Note (Signed)
On prior CT imaging 02/01/18 Not on statin therapy at this time 

## 2020-09-18 NOTE — Progress Notes (Signed)
Subjective:    Patient ID: Kurt Mendoza, male    DOB: March 30, 1960, 60 y.o.   MRN: [id number]  Kurt Mendoza is a 60 y.o. male presenting on 09/18/2020 for Annual Exam   HPI  Here for Annual Physical and Lab Review.  CHRONIC HTN: Doing well home BP checks SBP 130s avg Current Meds - Losartan '50mg'$  daily Reports good compliance, took meds today. Tolerating well, w/o complaints. Denies CP, dyspnea, HA, edema, dizziness / lightheadedness  Aortic Atherosclerosis On prior imaging.  Updates  Gout, chronic Followed by Dr Posey Pronto [facility] Rheumatology On Allopurinol '300mg'$  daily for prevention No further flare ups of gout since that time.  Osteoarthritis multiple sites Cervical DDD Neck Thumb OA/DJD  Followed by Dr Candelaria Stagers - [place]. He used to have injection therapy by [facility]  Tried Voltaren topical Taking Tylenol x 2 and Ibuprofen x 3  Health Maintenance:  [place] x 05/2019, no booster. He will consider it.  Due Flu Shot in future.  Consider Shingrix Vaccine.  Colon CA Screening - interested to pursue, no fam history, asymptomatic.  Depression screen [facility] 2/9 09/18/2020 03/02/2019  Decreased Interest 0 0  Down, Depressed, Hopeless 0 0  PHQ - 2 Score 0 0  Altered sleeping 0 -  Tired, decreased energy 0 -  Change in appetite 0 -  Feeling bad or failure about yourself  0 -  Trouble concentrating 0 -  Moving slowly or fidgety/restless 0 -  Suicidal thoughts 0 -  PHQ-9 Score 0 -  Difficult doing work/chores Not difficult at all -    Past Medical History:  Diagnosis Date   Esophagitis    Gout    History of irregular heartbeat    Hypertension    Past Surgical History:  Procedure Laterality Date   [place]  2014   LAPAROSCOPIC APPENDECTOMY N/A 02/02/2018   Procedure: APPENDECTOMY LAPAROSCOPIC;  Surgeon: Herbert Pun, MD;  Location: [facility] ORS;  Service: General;  Laterality: N/A;   Social History    Socioeconomic History   Marital status: Married    Spouse name: Lattie Haw   Number of children: Not on file   Years of education: High School   Highest education level: High school graduate  Occupational History   Not on file  Tobacco Use   Smoking status: Never   Smokeless tobacco: Never  Vaping Use   Vaping Use: Never used  Substance and Sexual Activity   Alcohol use: Yes    Alcohol/week: 3.0 standard drinks    Types: 3 Standard drinks or equivalent per week    Comment: week end   Drug use: No   Sexual activity: Not on file  Other Topics Concern   Not on file  Social History Narrative   Not on file   Social Determinants of Health   Financial Resource Strain: Not on file  Food Insecurity: Not on file  Transportation Needs: Not on file  Physical Activity: Not on file  Stress: Not on file  Social Connections: Not on file  Intimate Partner Violence: Not on file   Family History  Problem Relation Age of Onset   Hyperlipidemia Mother    Stroke Father    Hyperlipidemia Father    Diabetes Father    Heart disease Father    Thyroid disease Sister    Thyroid disease Brother    Current Outpatient Medications on File Prior to Visit  Medication Sig   allopurinol (ZYLOPRIM) 300  MG tablet Take 300 mg by mouth daily.   losartan (COZAAR) 50 MG tablet Take 1 tablet (50 mg total) by mouth daily.   No current facility-administered medications on file prior to visit.    Review of Systems  Constitutional:  Negative for activity change, appetite change, chills, diaphoresis, fatigue and fever.  HENT:  Negative for congestion and hearing loss.   Eyes:  Negative for visual disturbance.  Respiratory:  Negative for cough, chest tightness, shortness of breath and wheezing.   Cardiovascular:  Negative for chest pain, palpitations and leg swelling.  Gastrointestinal:  Negative for abdominal pain, constipation, diarrhea, nausea and vomiting.  Genitourinary:  Negative for dysuria,  frequency and hematuria.  Musculoskeletal:  Positive for arthralgias. Negative for neck pain.  Skin:  Negative for rash.  Neurological:  Negative for dizziness, weakness, light-headedness, numbness and headaches.  Hematological:  Negative for adenopathy.  Psychiatric/Behavioral:  Negative for behavioral problems, dysphoric mood and sleep disturbance.   Per HPI unless specifically indicated above      Objective:    BP 134/70 (BP Location: Left Arm, Cuff Size: Normal)   Pulse 70   Ht '6\' 1"'$  (1.854 m)   Wt 233 lb 12.8 oz (106.1 kg)   SpO2 98%   BMI 30.85 kg/m   Wt Readings from Last 3 Encounters:  09/18/20 233 lb 12.8 oz (106.1 kg)  02/07/20 230 lb (104.3 kg)  09/24/19 237 lb (107.5 kg)    Physical Exam Vitals and nursing note reviewed.  Constitutional:      General: He is not in acute distress.    Appearance: He is well-developed. He is not diaphoretic.     Comments: Well-appearing, comfortable, cooperative  HENT:     Head: Normocephalic and atraumatic.  Eyes:     General:        Right eye: No discharge.        Left eye: No discharge.     Conjunctiva/sclera: Conjunctivae normal.     Pupils: Pupils are equal, round, and reactive to light.  Neck:     Thyroid: No thyromegaly.  Cardiovascular:     Rate and Rhythm: Normal rate and regular rhythm.     Pulses: Normal pulses.     Heart sounds: Normal heart sounds. No murmur heard. Pulmonary:     Effort: Pulmonary effort is normal. No respiratory distress.     Breath sounds: Normal breath sounds. No wheezing or rales.  Abdominal:     General: Bowel sounds are normal. There is no distension.     Palpations: Abdomen is soft. There is no mass.     Tenderness: There is no abdominal tenderness.  Musculoskeletal:        General: No tenderness. Normal range of motion.     Cervical back: Normal range of motion and neck supple.     Comments: Upper / Lower Extremities: - Normal muscle tone, strength bilateral upper extremities 5/5,  lower extremities 5/5  Lymphadenopathy:     Cervical: No cervical adenopathy.  Skin:    General: Skin is warm and dry.     Findings: No erythema or rash.  Neurological:     Mental Status: He is alert and oriented to person, place, and time.     Comments: Distal sensation intact to light touch all extremities  Psychiatric:        Mood and Affect: Mood normal.        Behavior: Behavior normal.        Thought Content: Thought  content normal.     Comments: Well groomed, good eye contact, normal speech and thoughts     Results for orders placed or performed during the hospital encounter of 02/07/20  Covid-19, Flu A+B ([facility])   Specimen: Nasopharyngeal   Naso  Result Value Ref Range   SARS-CoV-2, NAA Not Detected Not Detected   Influenza A, NAA Not Detected Not Detected   Influenza B, NAA Not Detected Not Detected      Assessment & Plan:   Problem List Items Addressed This Visit     Primary osteoarthritis of both first carpometacarpal joints   Hypertension   Relevant Orders   COMPLETE METABOLIC PANEL WITH GFR   CBC with Differential/Platelet   Gout   Relevant Orders   CBC with Differential/Platelet   Chronic neck pain   Other Visit Diagnoses     Annual physical exam    -  Primary   Relevant Orders   Hepatitis C antibody   COMPLETE METABOLIC PANEL WITH GFR   Lipid panel   CBC with Differential/Platelet   Hemoglobin A1c   Need for hepatitis C screening test       Relevant Orders   Hepatitis C antibody   Abnormal glucose       Relevant Orders   Hemoglobin A1c   Screening for prostate cancer       Relevant Orders   PSA   Screening for colon cancer       Relevant Orders   Ambulatory referral to Gastroenterology       Updated Health Maintenance information  Reviewed recent lab results with patient Referral to GI for Colonoscopy initial screening. Labs ordered now, to be collected fasting Include screening PSA Encouraged improvement to lifestyle with diet  and exercise Goal of weight loss  Gout Followed by [facility] Rhe[facility] Improved now on chronic prophylaxis Allopurinol '300mg'$  daily, no flares, doing very well  HTN Controlled on [facility] Losartan, refill.  Osteoarthritis  multiple joints Bilateral thumb [facility] Cervical Neck DDD Chronic issues, on conservative management, has seen [facility] in past and now [facility], mixed results lately with injections for thumbs. He is interested in pursuing further imaging for neck and other management, he may return to [facility]   No orders of the defined types were placed in this encounter.     Follow up plan: Return in about 1 year (around 09/18/2021) for 1 year Annual Physical in AM fasting lab AFTER.   Nobie Putnam, DO [facility] 09/18/2020, 1:36 PM

## 2020-09-19 LAB — CBC WITH DIFFERENTIAL/PLATELET
Absolute Monocytes: 627 cells/uL (ref 200–950)
Basophils Absolute: 29 cells/uL (ref 0–200)
Basophils Relative: 0.5 %
Eosinophils Absolute: 40 cells/uL (ref 15–500)
Eosinophils Relative: 0.7 %
HCT: 45.7 % (ref 38.5–50.0)
Hemoglobin: 15.7 g/dL (ref 13.2–17.1)
Lymphs Abs: 2052 cells/uL (ref 850–3900)
MCH: 31.7 pg (ref 27.0–33.0)
MCHC: 34.4 g/dL (ref 32.0–36.0)
MCV: 92.3 fL (ref 80.0–100.0)
MPV: 11.6 fL (ref 7.5–12.5)
Monocytes Relative: 11 %
Neutro Abs: 2953 cells/uL (ref 1500–7800)
Neutrophils Relative %: 51.8 %
Platelets: 193 10*3/uL (ref 140–400)
RBC: 4.95 10*6/uL (ref 4.20–5.80)
RDW: 12.4 % (ref 11.0–15.0)
Total Lymphocyte: 36 %
WBC: 5.7 10*3/uL (ref 3.8–10.8)

## 2020-09-19 LAB — COMPLETE METABOLIC PANEL WITH GFR
AG Ratio: 1.8 (calc) (ref 1.0–2.5)
ALT: 46 U/L (ref 9–46)
AST: 27 U/L (ref 10–35)
Albumin: 4.8 g/dL (ref 3.6–5.1)
Alkaline phosphatase (APISO): 87 U/L (ref 35–144)
BUN: 14 mg/dL (ref 7–25)
CO2: 23 mmol/L (ref 20–32)
Calcium: 9.3 mg/dL (ref 8.6–10.3)
Chloride: 104 mmol/L (ref 98–110)
Creat: 0.77 mg/dL (ref 0.70–1.35)
Globulin: 2.6 g/dL (calc) (ref 1.9–3.7)
Glucose, Bld: 86 mg/dL (ref 65–99)
Potassium: 3.7 mmol/L (ref 3.5–5.3)
Sodium: 140 mmol/L (ref 135–146)
Total Bilirubin: 1.1 mg/dL (ref 0.2–1.2)
Total Protein: 7.4 g/dL (ref 6.1–8.1)
eGFR: 102 mL/min/{1.73_m2} (ref >=60)

## 2020-09-19 LAB — LIPID PANEL
Cholesterol: 175 mg/dL (ref <200)
HDL: 46 mg/dL (ref >=40)
LDL Cholesterol (Calc): 105 mg/dL (calc) — ABNORMAL HIGH
Non-HDL Cholesterol (Calc): 129 mg/dL (calc) (ref <130)
Total CHOL/HDL Ratio: 3.8 (calc) (ref <5.0)
Triglycerides: 143 mg/dL (ref <150)

## 2020-09-19 LAB — HEPATITIS C ANTIBODY
Hepatitis C Ab: NONREACTIVE
SIGNAL TO CUT-OFF: 0.01 (ref <1.00)

## 2020-09-19 LAB — HEMOGLOBIN A1C
Hgb A1c MFr Bld: 4.9 % of total Hgb (ref <5.7)
Mean Plasma Glucose: 94 mg/dL
eAG (mmol/L): 5.2 mmol/L

## 2020-09-19 LAB — PSA: PSA: 0.73 ng/mL (ref <=4.00)

## 2020-09-26 ENCOUNTER — Encounter: Payer: Self-pay | Admitting: *Deleted

## 2020-10-24 ENCOUNTER — Encounter: Payer: Self-pay | Admitting: Family Medicine

## 2020-10-25 ENCOUNTER — Other Ambulatory Visit: Payer: Self-pay

## 2020-10-25 DIAGNOSIS — M1A00X0 Idiopathic chronic gout, unspecified site, without tophus (tophi): Secondary | ICD-10-CM

## 2020-10-25 MED ORDER — ALLOPURINOL 300 MG PO TABS: 300.0000 mg | ORAL_TABLET | Freq: Every day | ORAL | 3 refills | Status: DC

## 2020-10-26 ENCOUNTER — Encounter: Payer: Self-pay | Admitting: Family Medicine

## 2020-10-26 DIAGNOSIS — M180 Bilateral primary osteoarthritis of first carpometacarpal joints: Secondary | ICD-10-CM

## 2020-10-26 DIAGNOSIS — M19041 Primary osteoarthritis, right hand: Secondary | ICD-10-CM

## 2020-10-26 DIAGNOSIS — M19042 Primary osteoarthritis, left hand: Secondary | ICD-10-CM

## 2020-10-27 NOTE — Addendum Note (Signed)
Addended by: Olin Hauser on: 10/27/2020 05:14 PM   Modules accepted: Orders

## 2020-11-07 DIAGNOSIS — M13841 Other specified arthritis, right hand: Secondary | ICD-10-CM | POA: Diagnosis not present

## 2020-11-07 DIAGNOSIS — M5412 Radiculopathy, cervical region: Secondary | ICD-10-CM | POA: Diagnosis not present

## 2020-11-07 DIAGNOSIS — M13842 Other specified arthritis, left hand: Secondary | ICD-10-CM | POA: Diagnosis not present

## 2020-11-17 ENCOUNTER — Telehealth: Payer: Self-pay | Admitting: Family Medicine

## 2020-11-17 DIAGNOSIS — I10 Essential (primary) hypertension: Secondary | ICD-10-CM

## 2020-11-17 NOTE — Telephone Encounter (Signed)
[facility] called and spoke to [place], [facility] about the refill(s) Losartan requested. Advised it was sent on 09/18/20 #90/3 refill(s). He says yes they have it and will have it ready for pickup later today. Patient called, left VM that medication will be ready for pickup later today.

## 2020-11-17 NOTE — Telephone Encounter (Signed)
PATIENT IS OUT OF THIS MED. PLEASE SEND SHORT SUPPLY. Medication Refill - Medication: losartan (COZAAR) 50 MG tablet   Has the patient contacted their pharmacy? yes (Agent: If no, request that the patient contact the pharmacy for the refill.) (Agent: If yes, when and what did the pharmacy advise?)contact pcp  Preferred Pharmacy (with phone number or street name):  [facility] [place], [place] AT [place] Phone:  [phone]  Fax:  [phone]     Has the patient been seen for an appointment in the last year OR does the patient have an upcoming appointment? yes  Agent: Please be advised that RX refills may take up to 3 business days. We ask that you follow-up with your pharmacy.  h your pharmacy.

## 2020-12-14 ENCOUNTER — Encounter: Payer: Self-pay | Admitting: Family Medicine

## 2020-12-14 DIAGNOSIS — M180 Bilateral primary osteoarthritis of first carpometacarpal joints: Secondary | ICD-10-CM

## 2020-12-14 DIAGNOSIS — G8929 Other chronic pain: Secondary | ICD-10-CM

## 2020-12-14 DIAGNOSIS — M542 Cervicalgia: Secondary | ICD-10-CM

## 2020-12-14 DIAGNOSIS — M1A00X0 Idiopathic chronic gout, unspecified site, without tophus (tophi): Secondary | ICD-10-CM

## 2020-12-14 MED ORDER — MELOXICAM 15 MG PO TABS: ORAL_TABLET | ORAL | 0 refills | Status: DC

## 2020-12-18 MED ORDER — MELOXICAM 15 MG PO TABS: 15.0000 mg | ORAL_TABLET | Freq: Every day | ORAL | 2 refills | Status: DC | PRN

## 2020-12-18 MED ORDER — TIZANIDINE HCL 4 MG PO TABS: 2.0000 mg | ORAL_TABLET | Freq: Three times a day (TID) | ORAL | 2 refills | Status: DC | PRN

## 2020-12-18 NOTE — Addendum Note (Signed)
Addended by: Olin Hauser on: 12/18/2020 06:29 PM   Modules accepted: Orders

## 2020-12-19 DIAGNOSIS — M19042 Primary osteoarthritis, left hand: Secondary | ICD-10-CM | POA: Diagnosis not present

## 2020-12-19 DIAGNOSIS — M180 Bilateral primary osteoarthritis of first carpometacarpal joints: Secondary | ICD-10-CM | POA: Diagnosis not present

## 2020-12-26 MED ORDER — ALLOPURINOL 300 MG PO TABS: 450.0000 mg | ORAL_TABLET | Freq: Every day | ORAL | 1 refills | Status: DC

## 2020-12-26 NOTE — Addendum Note (Signed)
Addended by: Olin Hauser on: 12/26/2020 04:51 PM   Modules accepted: Orders

## 2021-01-03 ENCOUNTER — Other Ambulatory Visit: Payer: Self-pay

## 2021-01-03 ENCOUNTER — Ambulatory Visit (INDEPENDENT_AMBULATORY_CARE_PROVIDER_SITE_OTHER): Payer: BC Managed Care – PPO

## 2021-01-03 VITALS — Ht 73.0 in | Wt 233.0 lb

## 2021-01-03 DIAGNOSIS — Z23 Encounter for immunization: Secondary | ICD-10-CM | POA: Diagnosis not present

## 2021-03-12 ENCOUNTER — Telehealth: Payer: Self-pay

## 2021-03-12 NOTE — Telephone Encounter (Signed)
Tried calling patient, no answer and no VM. Patient is coming up on [place] reports but looks as if he is no longer a patient here. Need to confirm this with the patient. Will try to call patient again.

## 2021-04-06 IMAGING — DX DG CERVICAL SPINE COMPLETE 4+V
6 series · 6 of 6 positions shown · non-contrast
Comparison: None.

CLINICAL DATA: Chronic neck pain

EXAM:
CERVICAL SPINE - COMPLETE 4+ VIEW

[c-spine lat]
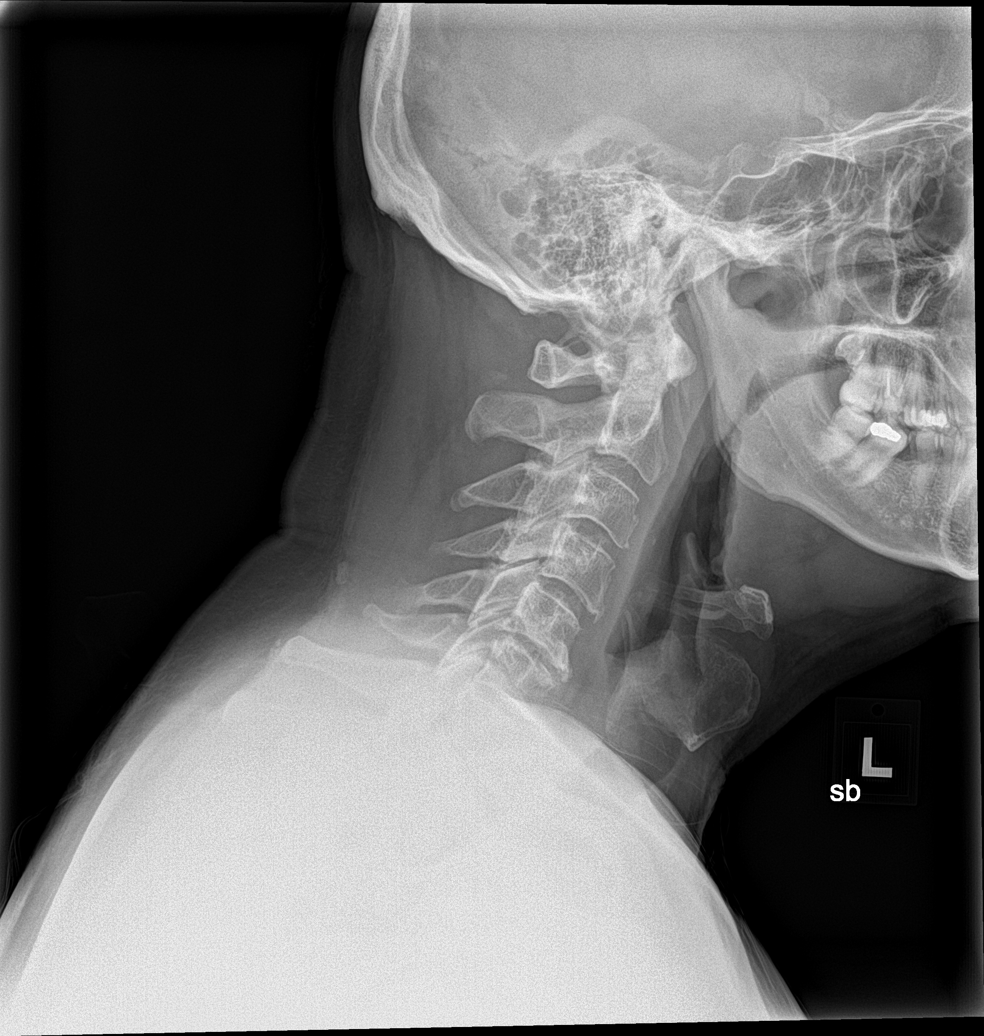

[c-spine obl (1 of 2)]
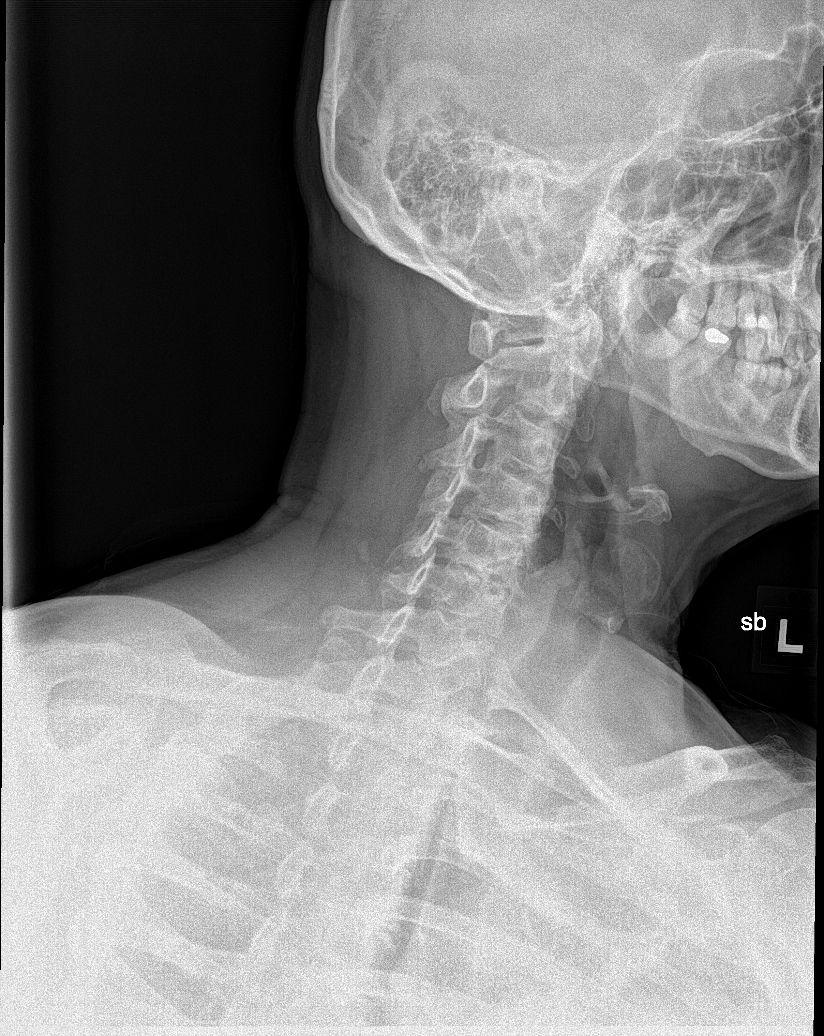

[c-spine obl (2 of 2)]
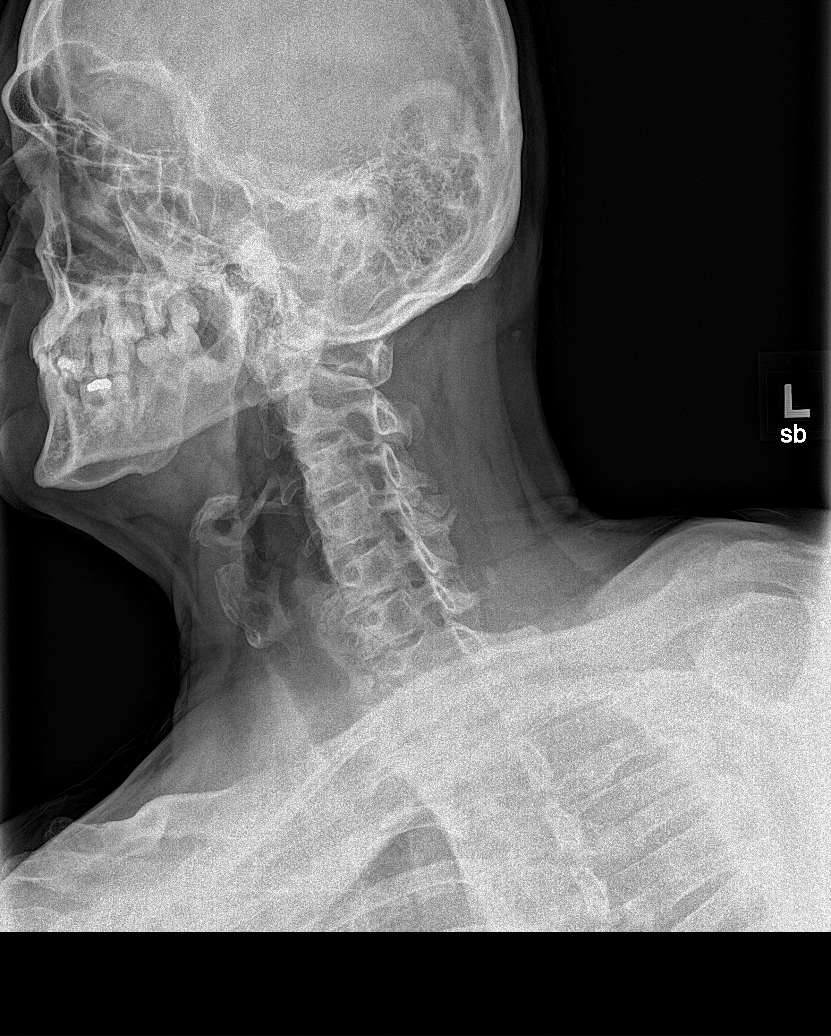

[c-spine ap]
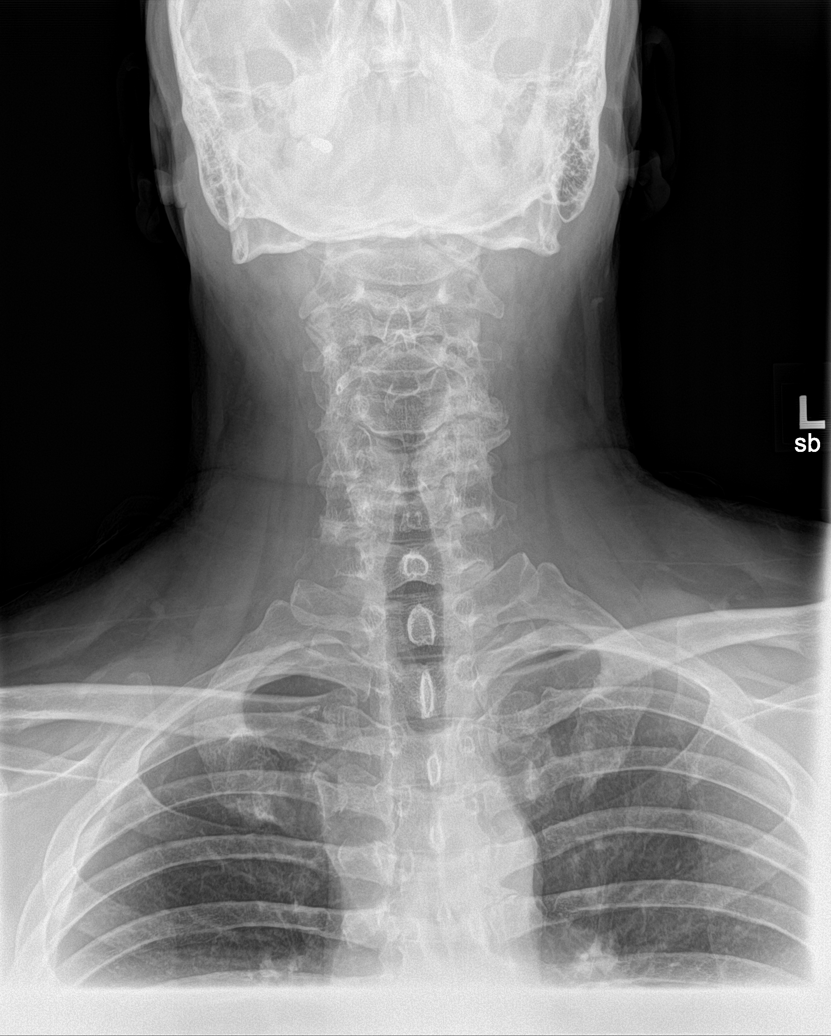

[c-spine open mouth]
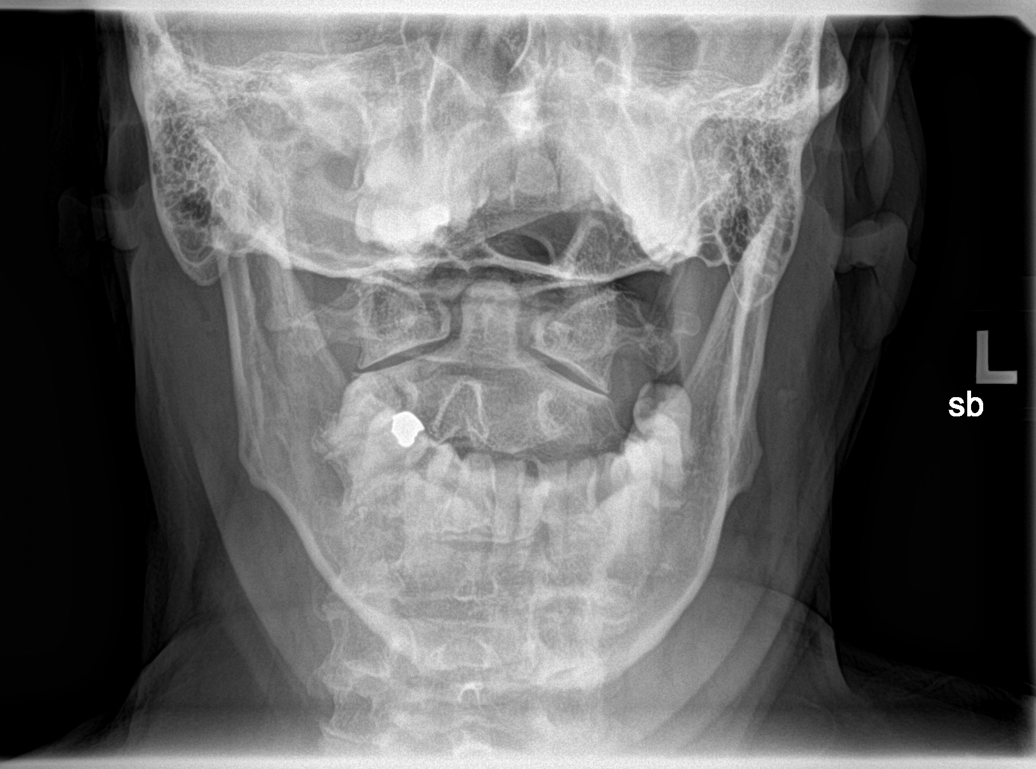

[c-spine swimmers]
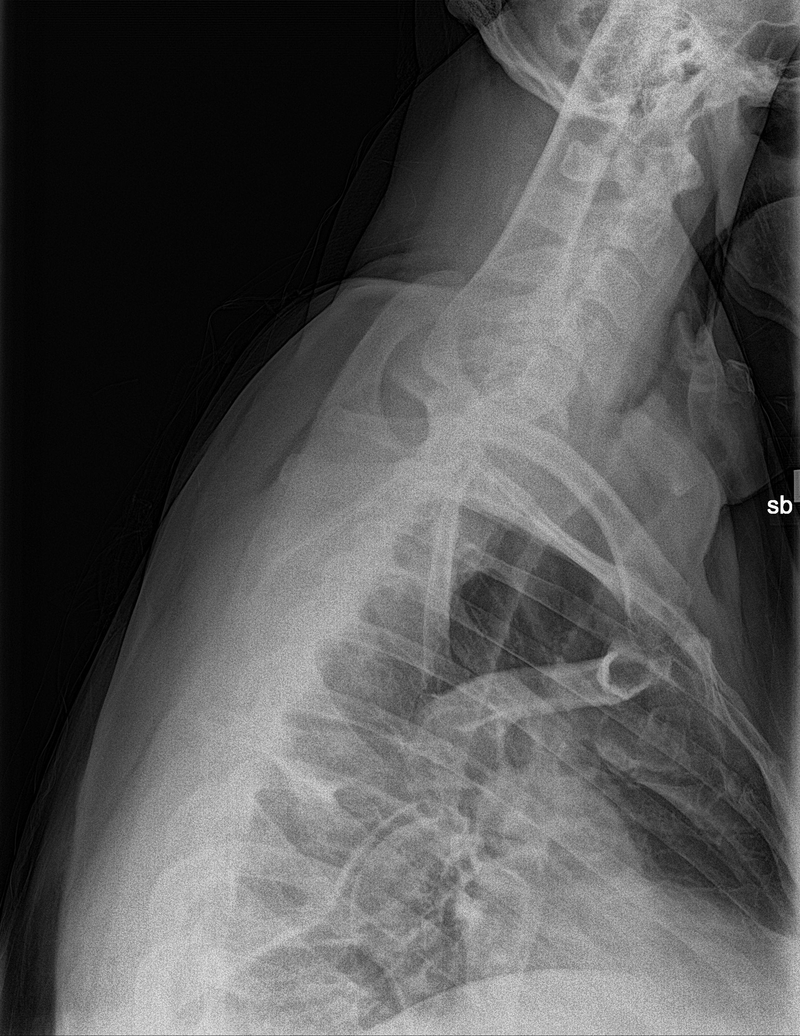

[6 of 6 positions shown; findings below may reference images not displayed]

FINDINGS: There is no evidence of cervical spine fracture or prevertebral soft
tissue swelling. Alignment is normal. Disc height loss with
degenerative endplate spurring is most pronounced at C5-6. Oblique
views reveal slight narrowing of the bony foramina most pronounced
at C4-5 on the left and C5-6 on the right. Lung apices clear.
IMPRESSION: Mild multilevel degenerative changes of the cervical spine with
evidence of bony foraminal narrowing at C4-5 on the left and C5-6 on
the right.

## 2021-04-26 DIAGNOSIS — M79645 Pain in left finger(s): Secondary | ICD-10-CM | POA: Diagnosis not present

## 2021-05-21 ENCOUNTER — Encounter: Payer: Self-pay | Admitting: Family Medicine

## 2021-05-21 DIAGNOSIS — G8929 Other chronic pain: Secondary | ICD-10-CM

## 2021-05-21 DIAGNOSIS — M180 Bilateral primary osteoarthritis of first carpometacarpal joints: Secondary | ICD-10-CM

## 2021-05-21 MED ORDER — GABAPENTIN 100 MG PO CAPS: ORAL_CAPSULE | ORAL | 1 refills | Status: DC

## 2021-05-21 NOTE — Addendum Note (Signed)
Addended by: Olin Hauser on: 05/21/2021 03:11 PM ? ? Modules accepted: Orders ? ?

## 2021-06-16 ENCOUNTER — Other Ambulatory Visit: Payer: Self-pay | Admitting: Family Medicine

## 2021-06-16 DIAGNOSIS — M1A00X0 Idiopathic chronic gout, unspecified site, without tophus (tophi): Secondary | ICD-10-CM

## 2021-06-18 NOTE — Telephone Encounter (Signed)
Requested medications are due for refill today.  yes ? ?Requested medications are on the active medications list.  yes ? ?Last refill. 12/26/2020 #135 1 refill ? ?Future visit scheduled.  yes ? ?Notes to clinic.  Medication refill failed protocol due to expired labs. ? ? ? ?Requested Prescriptions  ?Pending Prescriptions Disp Refills  ? allopurinol (ZYLOPRIM) 300 MG tablet [Pharmacy Med Name: ALLOPURINOL '300MG'$  TABLETS] 135 tablet 1  ?  Sig: TAKE 1 AND 1/2 TABLETS(450 MG) BY MOUTH DAILY  ?  ? Endocrinology:  Gout Agents - allopurinol Failed - 06/16/2021  8:50 AM  ?  ?  Failed - Uric Acid in normal range and within 360 days  ?  Uric Acid  ?Date Value Ref Range Status  ?03/02/2019 8.8 (H) 3.8 - 8.4 mg/dL Final  ?  Comment:  ?             Therapeutic target for gout patients: <6.0  ?  ?  ?  ?  Passed - Cr in normal range and within 360 days  ?  Creat  ?Date Value Ref Range Status  ?09/18/2020 0.77 0.70 - 1.35 mg/dL Final  ?  ?  ?  ?  Passed - Valid encounter within last 12 months  ?  Recent Outpatient Visits   ? ?      ? 9 months ago Annual physical exam  ? [place], DO  ? 1 year ago Acute gout of left ankle, unspecified cause  ? [place], DO  ? 2 years ago Essential hypertension  ? [place], Henrine Screws T, NP  ? 2 years ago Arthritis of both hands  ? [facility] Criss[place], Jeannette How, MD  ? 5 years ago Difficulty breathing  ? [facility] Jeananne Rama, Jeannette How, MD  ? ?  ?  ?Future Appointments   ? ?        ? In 3 months Karamalegos, [facility], [facility]  ? ?  ? ? ?  ?  ?  Passed - CBC within normal limits and completed in the last 12 months  ?  WBC  ?Date Value Ref Range Status  ?09/18/2020 5.7 3.8 - 10.8 Thousand/uL Final  ? ?RBC  ?Date Value Ref Range Status  ?09/18/2020 4.95 4.20 - 5.80 Million/uL Final  ? ?Hemoglobin  ?Date Value Ref Range Status  ?09/18/2020 15.7 13.2  - 17.1 g/dL Final  ? ?HCT  ?Date Value Ref Range Status  ?09/18/2020 45.7 38.5 - 50.0 % Final  ? ?MCHC  ?Date Value Ref Range Status  ?09/18/2020 34.4 32.0 - 36.0 g/dL Final  ? ?MCH  ?Date Value Ref Range Status  ?09/18/2020 31.7 27.0 - 33.0 pg Final  ? ?MCV  ?Date Value Ref Range Status  ?09/18/2020 92.3 80.0 - 100.0 fL Final  ? ?No results found for: PLTCOUNTKUC, LABPLAT, [place] ?RDW  ?Date Value Ref Range Status  ?09/18/2020 12.4 11.0 - 15.0 % Final  ? ?  ?  ?  ?  ?

## 2021-07-02 ENCOUNTER — Encounter: Payer: Self-pay | Admitting: Emergency Medicine

## 2021-07-02 ENCOUNTER — Ambulatory Visit
Admission: EM | Admit: 2021-07-02 | Discharge: 2021-07-02 | Disposition: A | Discharge status: Home / Self Care | Payer: BC Managed Care – PPO | Attending: Emergency Medicine | Admitting: Emergency Medicine

## 2021-07-02 DIAGNOSIS — J069 Acute upper respiratory infection, unspecified: Secondary | ICD-10-CM | POA: Diagnosis not present

## 2021-07-02 MED ORDER — IPRATROPIUM BROMIDE 0.06 % NA SOLN: 2.0000 | Freq: Four times a day (QID) | NASAL | 12 refills | Status: DC

## 2021-07-02 MED ORDER — BENZONATATE 100 MG PO CAPS: 200.0000 mg | ORAL_CAPSULE | Freq: Three times a day (TID) | ORAL | 0 refills | Status: DC

## 2021-07-02 MED ORDER — PROMETHAZINE-DM 6.25-15 MG/5ML PO SYRP: 5.0000 mL | ORAL_SOLUTION | Freq: Four times a day (QID) | ORAL | 0 refills | Status: DC | PRN

## 2021-07-02 NOTE — Discharge Instructions (Signed)

## 2021-07-02 NOTE — ED Triage Notes (Signed)
Pt reports sore throat and body aches since yesterday. Denies taking any OTC medication.  ?

## 2021-07-02 NOTE — ED Provider Notes (Signed)
?[facility] ? ? ? ?CSN: [id number] ?Arrival date & time: 07/02/21  1308 ? ? ?  ? ?History   ?Chief Complaint ?Chief Complaint  ?Patient presents with  ? Sore Throat  ? Generalized Body Aches  ? ? ?HPI ?Kurt Mendoza is a 61 y.o. male.  ? ?HPI ? ?61 year old male here for evaluation of respiratory complaints. ? ?Patient reports that his symptoms began yesterday and they consist of runny nose nasal congestion, scratchy throat, fatigue, and a nonproductive cough.  This is not been associated with fever, shortness of breath or wheezing, ear pain, or GI complaints.  Patient reports he feels similar to when he came down with COVID a year and a half ago.  He does endorse that his wife had an upper respiratory infection for the past 2 weeks.  Patient is declining COVID testing at this time. ? ?Past Medical History:  ?Diagnosis Date  ? Esophagitis   ? Gout   ? History of irregular heartbeat   ? Hypertension   ? ? ?Patient Active Problem List  ? Diagnosis Date Noted  ? Primary osteoarthritis of both first carpometacarpal joints 09/18/2020  ? Aortic atherosclerosis ([place]) 05/09/2020  ? Chronic neck pain 03/03/2019  ? Arthritis of both hands 07/07/2018  ? Gout 04/04/2015  ? Hypertension   ? ? ?Past Surgical History:  ?Procedure Laterality Date  ? BACK SURGERY  1997  ? KNEE SURGERY  2014  ? LAPAROSCOPIC APPENDECTOMY N/A 02/02/2018  ? Procedure: APPENDECTOMY LAPAROSCOPIC;  Surgeon: Herbert Pun, MD;  Location: [facility] ORS;  Service: General;  Laterality: N/A;  ? ? ? ? ? ?Home Medications   ? ?Prior to Admission medications   ?Medication Sig Start Date End Date Taking? Authorizing Provider  ?benzonatate (TESSALON) 100 MG capsule Take 2 capsules (200 mg total) by mouth every 8 (eight) hours. 07/02/21  Yes Margarette Canada, NP  ?ipratropium (ATROVENT) 0.06 % nasal spray Place 2 sprays into both nostrils 4 (four) times daily. 07/02/21  Yes Margarette Canada, NP  ?promethazine-dextromethorphan (PROMETHAZINE-DM) 6.25-15 MG/5ML  syrup Take 5 mLs by mouth 4 (four) times daily as needed. 07/02/21  Yes Margarette Canada, NP  ?allopurinol (ZYLOPRIM) 300 MG tablet TAKE 1 AND 1/2 TABLETS(450 MG) BY MOUTH DAILY 06/18/21   Par[place] Ranger, Devonne Doughty, DO  ?gabapentin (NEURONTIN) 100 MG capsule Start 1 capsule daily, increase by 1 cap every 2-3 days as tolerated up to 3 times a day, or may take 3 at once in evening. 05/21/21   Karamalegos, Devonne Doughty, DO  ?losartan (COZAAR) 50 MG tablet Take 1 tablet (50 mg total) by mouth daily. 09/18/20   Karamalegos, Devonne Doughty, DO  ?meloxicam (MOBIC) 15 MG tablet Take 1 tablet (15 mg total) by mouth daily as needed for pain. 12/18/20   Karamalegos, Devonne Doughty, DO  ?tiZANidine (ZANAFLEX) 4 MG tablet Take 0.5-1 tablets (2-4 mg total) by mouth every 8 (eight) hours as needed for muscle spasms. 12/18/20   Olin Hauser, DO  ? ? ?Family History ?Family History  ?Problem Relation Age of Onset  ? Hyperlipidemia Mother   ? Stroke Father   ? Hyperlipidemia Father   ? Diabetes Father   ? Heart disease Father   ? Thyroid disease Sister   ? Thyroid disease Brother   ? ? ?Social History ?Social History  ? ?Tobacco Use  ? Smoking status: Never  ? Smokeless tobacco: Never  ?Vaping Use  ? Vaping Use: Never used  ?Substance Use Topics  ? Alcohol use: Yes  ?  Alcohol/week: 3.0 standard drinks  ?  Types: 3 Standard drinks or equivalent per week  ?  Comment: week end  ? Drug use: No  ? ? ? ?Allergies   ?Amlodipine and Morphine and related ? ? ?Review of Systems ?Review of Systems  ?Constitutional:  Positive for fatigue. Negative for fever.  ?HENT:  Positive for congestion, rhinorrhea and sore throat. Negative for ear pain.   ?Respiratory:  Positive for cough. Negative for shortness of breath and wheezing.   ?Gastrointestinal:  Negative for diarrhea, nausea and vomiting.  ?Musculoskeletal:  Positive for arthralgias and myalgias.  ?Skin:  Negative for rash.  ?Hematological: Negative.   ?Psychiatric/Behavioral: Negative.     ? ? ?Physical Exam ?Triage Vital Signs ?ED Triage Vitals  ?Enc Vitals Group  ?   BP 07/02/21 1338 (!) 144/86  ?   Pulse Rate 07/02/21 1338 83  ?   Resp 07/02/21 1338 19  ?   Temp 07/02/21 1338 98.2 ?F (36.8 ?C)  ?   Temp Source 07/02/21 1338 Oral  ?   SpO2 07/02/21 1338 96 %  ?   Weight 07/02/21 1337 233 lb 0.4 oz (105.7 kg)  ?   Height 07/02/21 1337 '6\' 1"'$  (1.854 m)  ?   Head Circumference --   ?   Peak Flow --   ?   Pain Score 07/02/21 1337 4  ?   Pain Loc --   ?   Pain Edu? --   ?   Excl. in [place]? --   ? ?No data found. ? ?Updated Vital Signs ?BP (!) 144/86 (BP Location: Right Arm)   Pulse 83   Temp 98.2 ?F (36.8 ?C) (Oral)   Resp 19   Ht '6\' 1"'$  (1.854 m)   Wt 233 lb 0.4 oz (105.7 kg)   SpO2 96%   BMI 30.74 kg/m?  ? ?Visual Acuity ?Right Eye Distance:   ?Left Eye Distance:   ?Bilateral Distance:   ? ?Right Eye Near:   ?Left Eye Near:    ?Bilateral Near:    ? ?Physical Exam ?Vitals and nursing note reviewed.  ?Constitutional:   ?   Appearance: Normal appearance. He is not ill-appearing.  ?HENT:  ?   Head: Normocephalic and atraumatic.  ?   Right Ear: Tympanic membrane, ear canal and external ear normal. There is no impacted cerumen.  ?   Left Ear: Tympanic membrane, ear canal and external ear normal. There is no impacted cerumen.  ?   Nose: Congestion and rhinorrhea present.  ?   Mouth/Throat:  ?   Mouth: Mucous membranes are moist.  ?   Pharynx: Oropharynx is clear. Posterior oropharyngeal erythema present.  ?Cardiovascular:  ?   Rate and Rhythm: Normal rate and regular rhythm.  ?   Pulses: Normal pulses.  ?   Heart sounds: Normal heart sounds. No murmur heard. ?  No friction rub. No gallop.  ?Pulmonary:  ?   Effort: Pulmonary effort is normal.  ?   Breath sounds: Normal breath sounds. No wheezing, rhonchi or rales.  ?Musculoskeletal:  ?   Cervical back: Normal range of motion and neck supple.  ?Lymphadenopathy:  ?   Cervical: No cervical adenopathy.  ?Skin: ?   General: Skin is warm and dry.  ?   Capillary  Refill: Capillary refill takes less than 2 seconds.  ?   Findings: No erythema or rash.  ?Neurological:  ?   General: No focal deficit present.  ?   Mental Status: He  is alert and oriented to person, place, and time.  ?Psychiatric:     ?   Mood and Affect: Mood normal.     ?   Behavior: Behavior normal.     ?   Thought Content: Thought content normal.     ?   Judgment: Judgment normal.  ? ? ? ?UC Treatments / Results  ?Labs ?(all labs ordered are listed, but only abnormal results are displayed) ?Labs Reviewed - No data to display ? ?EKG ? ? ?Radiology ?No results found. ? ?Procedures ?Procedures (including critical care time) ? ?Medications Ordered in UC ?Medications - No data to display ? ?Initial Impression / Assessment and Plan / UC Course  ?I have reviewed the triage vital signs and the nursing notes. ? ?Pertinent labs & imaging results that were available during my care of the patient were reviewed by me and considered in my medical decision making (see chart for details). ? ?Patient is a nontoxic appearing 61 year old male here for evaluation of respiratory symptoms as outlined in HPI above.  He states it feels similar to when he came down with COVID a year and a half ago and he also states that his wife is currently 2 weeks into an upper respiratory viral infection.  He is declining COVID testing at this time.  On exam patient has pearly-gray tympanic membranes bilaterally with normal light reflex and clear external auditory canals.  Nasal mucosa is mildly erythematous and edematous with scant clear discharge in both nares.  Oropharyngeal exam reveals posterior oropharyngeal erythema with clear postnasal drip.  No injection or exudate noted.  No cervical lymphadenopathy appreciated on exam.  Cardiopulmonary exam reveals clear lung sounds in all fields.  We will treat patient for viral respiratory infection with cough with Tessalon Perles, Atrovent nasal spray, and as needed cough syrup.  Return precautions  reviewed with patient.  Work note provided. ? ? ?Final Clinical Impressions(s) / UC Diagnoses  ? ?Final diagnoses:  ?Viral URI with cough  ? ? ? ?Discharge Instructions   ? ?  ?Use the Atrovent nasal spray, 2 squirts in each

## 2021-09-24 ENCOUNTER — Ambulatory Visit (INDEPENDENT_AMBULATORY_CARE_PROVIDER_SITE_OTHER): Payer: BC Managed Care – PPO | Admitting: Family Medicine

## 2021-09-24 ENCOUNTER — Encounter: Payer: Self-pay | Admitting: Family Medicine

## 2021-09-24 VITALS — BP 130/74 | HR 70 | Ht 73.0 in | Wt 229.8 lb

## 2021-09-24 DIAGNOSIS — M180 Bilateral primary osteoarthritis of first carpometacarpal joints: Secondary | ICD-10-CM

## 2021-09-24 DIAGNOSIS — M542 Cervicalgia: Secondary | ICD-10-CM

## 2021-09-24 DIAGNOSIS — I10 Essential (primary) hypertension: Secondary | ICD-10-CM

## 2021-09-24 DIAGNOSIS — G8929 Other chronic pain: Secondary | ICD-10-CM

## 2021-09-24 DIAGNOSIS — E7800 Pure hypercholesterolemia, unspecified: Secondary | ICD-10-CM | POA: Diagnosis not present

## 2021-09-24 DIAGNOSIS — Z125 Encounter for screening for malignant neoplasm of prostate: Secondary | ICD-10-CM | POA: Diagnosis not present

## 2021-09-24 DIAGNOSIS — M1A00X0 Idiopathic chronic gout, unspecified site, without tophus (tophi): Secondary | ICD-10-CM

## 2021-09-24 DIAGNOSIS — I700 Atherosclerosis of aorta: Secondary | ICD-10-CM

## 2021-09-24 DIAGNOSIS — R7309 Other abnormal glucose: Secondary | ICD-10-CM

## 2021-09-24 DIAGNOSIS — Z1211 Encounter for screening for malignant neoplasm of colon: Secondary | ICD-10-CM

## 2021-09-24 DIAGNOSIS — J342 Deviated nasal septum: Secondary | ICD-10-CM

## 2021-09-24 DIAGNOSIS — Z0000 Encounter for general adult medical examination without abnormal findings: Secondary | ICD-10-CM | POA: Diagnosis not present

## 2021-09-24 MED ORDER — LOSARTAN POTASSIUM 50 MG PO TABS: 50.0000 mg | ORAL_TABLET | Freq: Every day | ORAL | 3 refills | Status: DC

## 2021-09-24 MED ORDER — ALLOPURINOL 300 MG PO TABS
ORAL_TABLET | ORAL | 3 refills | Status: DC
Start: 2021-09-24 — End: 2021-12-31

## 2021-09-24 NOTE — Patient Instructions (Addendum)
Thank you for coming to the office today.  Refilled your medications for future additional refills.  [facility] [id number]  [place], [place] [address] Ph: [phone]  They will call you to schedule for nose.  ------  They will call you for colonoscopy  [facility]) [address], [place] [address] Phone: [phone]   DUE for [place] (no food or drink after midnight before the lab appointment, only water or coffee without cream/sugar on the morning of)  SCHEDULE "Lab Only" visit in the morning at the clinic for lab draw in 1 YEAR  - Make sure Lab Only appointment is at about 1 week before your next appointment, so that results will be available  For Lab Results, once available within 2-3 days of blood draw, you can can log in to MyChart online to view your results and a brief explanation. Also, we can discuss results at next follow-up visit.   Please schedule a Follow-up Appointment to: Return in about 1 year (around 09/25/2022) for 1 year Annual Physical AM apt fasting lab after.  If you have any other questions or concerns, please feel free to call the office or send a message through [place]. You may also schedule an earlier appointment if necessary.  Additionally, you may be receiving a survey about your experience at our office within a few days to 1 week by e-mail or mail. We value your feedback.  Nobie Putnam, DO [place] [facility]

## 2021-09-24 NOTE — Progress Notes (Signed)
Subjective:    Patient ID: Kurt Mendoza, male    DOB: [birth year], 61 y.o.   MRN: [id number]  Kurt Mendoza is a 61 y.o. male presenting on 09/24/2021 for Annual Exam   HPI  Here for Annual Physical   Osteoarthritis, multiple joints Chronic Neck Pain Hand pain/stiffness Reports still difficulty with neck pain and stiffness some days worse than others. Kurt Mendoza has come off multiple medications prefers to not take gabapentin and other meds any more.  CHRONIC HTN: Doing well home BP checks SBP 130s avg Current Meds - Losartan 21m daily Reports good compliance, took meds today. Tolerating well, w/o complaints. Denies CP, dyspnea, HA, edema, dizziness / lightheadedness   Aortic Atherosclerosis On prior imaging.   Gout, chronic [facility] Allopurinol 4587mdaily for prevention, taking 1.5 tab No further flare ups of gout since that time.   Osteoarthritis multiple sites Cervical DDD Neck Thumb OA/DJD   Followed by Dr KuCandelaria Stagers KCMizpahHe used to have injection therapy by [facility]  Tried Voltaren topical Taking Tylenol x 2 and Ibuprofen x 3    Health Maintenance:   Consider Shingrix Vaccine.   Colon CA Screening - Ordered Colonoscopy today, initial screening.     09/24/2021    8:44 AM 09/18/2020    1:32 PM 03/02/2019    9:41 AM  Depression screen PHQ 2/9  Decreased Interest 0 0 0  Down, Depressed, Hopeless 0 0 0  PHQ - 2 Score 0 0 0  Altered sleeping 0 0   Tired, decreased energy 0 0   Change in appetite 0 0   Feeling bad or failure about yourself  0 0   Trouble concentrating 0 0   Moving slowly or fidgety/restless 0 0   Suicidal thoughts 0 0   PHQ-9 Score 0 0   Difficult doing work/chores Not difficult at all Not difficult at all     Past Medical History:  Diagnosis Date   Esophagitis    Gout    History of irregular heartbeat    Hypertension    Past Surgical History:  Procedure Laterality Date   [id number]   LAPAROSCOPIC APPENDECTOMY N/A 02/02/2018   Procedure: APPENDECTOMY LAPAROSCOPIC;  Surgeon: CiHerbert PunMD;  Location: [facility] ORS;  Service: General;  Laterality: N/A;   Social History   Socioeconomic History   Marital status: Married    Spouse name: LiLattie Haw Number of children: Not on file   Years of education: High School   Highest education level: High school graduate  Occupational History   Not on file  Tobacco Use   Smoking status: Never   Smokeless tobacco: Never  Vaping Use   Vaping Use: Never used  Substance and Sexual Activity   Alcohol use: Yes    Alcohol/week: 3.0 standard drinks of alcohol    Types: 3 Standard drinks or equivalent per week    Comment: week end   Drug use: No   Sexual activity: Not on file  Other Topics Concern   Not on file  Social History Narrative   Not on file   Social Determinants of Health   Financial Resource Strain: Not on file  Food Insecurity: Not on file  Transportation Needs: Not on file  Physical Activity: Not on file  Stress: Not on file  Social Connections: Not on file  Intimate Partner Violence: Not on file   Family History  Problem Relation Age of Onset   Hyperlipidemia Mother    Stroke Father    Hyperlipidemia Father    Diabetes Father    Heart disease Father    Thyroid disease Sister    Thyroid disease Brother    Current Outpatient Medications on File Prior to Visit  Medication Sig   ipratropium (ATROVENT) 0.06 % nasal spray Place 2 sprays into both nostrils 4 (four) times daily.   No current facility-administered medications on file prior to visit.    Review of Systems  Constitutional:  Negative for activity change, appetite change, chills, diaphoresis, fatigue and fever.  HENT:  Negative for congestion and hearing loss.   Eyes:  Negative for visual disturbance.  Respiratory:  Negative for cough, chest tightness, shortness of breath and wheezing.   Cardiovascular:  Negative for  chest pain, palpitations and leg swelling.  Gastrointestinal:  Negative for abdominal pain, constipation, diarrhea, nausea and vomiting.  Genitourinary:  Negative for dysuria, frequency and hematuria.  Musculoskeletal:  Negative for arthralgias and neck pain.  Skin:  Negative for rash.  Neurological:  Negative for dizziness, weakness, light-headedness, numbness and headaches.  Hematological:  Negative for adenopathy.  Psychiatric/Behavioral:  Negative for behavioral problems, dysphoric mood and sleep disturbance.    Per HPI unless specifically indicated above      Objective:    BP 130/74 (BP Location: Left Arm, Cuff Size: Normal)   Pulse 70   Ht '6\' 1"'  (1.854 m)   Wt 229 lb 12.8 oz (104.2 kg)   SpO2 98%   BMI 30.32 kg/m   Wt Readings from Last 3 Encounters:  09/24/21 229 lb 12.8 oz (104.2 kg)  07/02/21 233 lb 0.4 oz (105.7 kg)  01/03/21 233 lb (105.7 kg)    Physical Exam Vitals and nursing note reviewed.  Constitutional:      General: Kurt Mendoza is not in acute distress.    Appearance: Kurt Mendoza is well-developed. Kurt Mendoza is not diaphoretic.     Comments: Well-appearing, comfortable, cooperative  HENT:     Head: Normocephalic and atraumatic.  Eyes:     General:        Right eye: No discharge.        Left eye: No discharge.     Conjunctiva/sclera: Conjunctivae normal.     Pupils: Pupils are equal, round, and reactive to light.  Neck:     Thyroid: No thyromegaly.  Cardiovascular:     Rate and Rhythm: Normal rate and regular rhythm.     Pulses: Normal pulses.     Heart sounds: Normal heart sounds. No murmur heard. Pulmonary:     Effort: Pulmonary effort is normal. No respiratory distress.     Breath sounds: Normal breath sounds. No wheezing or rales.  Abdominal:     General: Bowel sounds are normal. There is no distension.     Palpations: Abdomen is soft. There is no mass.     Tenderness: There is no abdominal tenderness.  Musculoskeletal:        General: No tenderness. Normal range of  motion.     Cervical back: Normal range of motion and neck supple.     Right lower leg: No edema.     Left lower leg: No edema.     Comments: Upper / Lower Extremities: - Normal muscle tone, strength bilateral upper extremities 5/5, lower extremities 5/5  Lymphadenopathy:     Cervical: No cervical adenopathy.  Skin:    General: Skin is warm and dry.  Findings: No erythema or rash.  Neurological:     Mental Status: Kurt Mendoza is alert and oriented to person, place, and time.     Comments: Distal sensation intact to light touch all extremities  Psychiatric:        Mood and Affect: Mood normal.        Behavior: Behavior normal.        Thought Content: Thought content normal.     Comments: Well groomed, good eye contact, normal speech and thoughts      Results for orders placed or performed in visit on 09/18/20  Hepatitis C antibody  Result Value Ref Range   Hepatitis C Ab NON-REACTIVE NON-REACTIVE   SIGNAL TO CUT-OFF 0.01 <1.00  COMPLETE METABOLIC PANEL WITH GFR  Result Value Ref Range   Glucose, Bld 86 65 - 99 mg/dL   BUN 14 7 - 25 mg/dL   Creat 0.77 0.70 - 1.35 mg/dL   eGFR 102 > OR = 60 mL/min/1.64m   BUN/Creatinine Ratio NOT APPLICABLE 6 - 22 (calc)   Sodium 140 135 - 146 mmol/L   Potassium 3.7 3.5 - 5.3 mmol/L   Chloride 104 98 - 110 mmol/L   CO2 23 20 - 32 mmol/L   Calcium 9.3 8.6 - 10.3 mg/dL   Total Protein 7.4 6.1 - 8.1 g/dL   Albumin 4.8 3.6 - 5.1 g/dL   Globulin 2.6 1.9 - 3.7 g/dL (calc)   AG Ratio 1.8 1.0 - 2.5 (calc)   Total Bilirubin 1.1 0.2 - 1.2 mg/dL   Alkaline phosphatase (APISO) 87 35 - 144 U/L   AST 27 10 - 35 U/L   ALT 46 9 - 46 U/L  Lipid panel  Result Value Ref Range   Cholesterol 175 <200 mg/dL   HDL 46 > OR = 40 mg/dL   Triglycerides 143 <150 mg/dL   LDL Cholesterol (Calc) 105 (H) mg/dL (calc)   Total CHOL/HDL Ratio 3.8 <5.0 (calc)   Non-HDL Cholesterol (Calc) 129 <130 mg/dL (calc)  CBC with Differential/Platelet  Result Value Ref Range    WBC 5.7 3.8 - 10.8 Thousand/uL   RBC 4.95 4.20 - 5.80 Million/uL   Hemoglobin 15.7 13.2 - 17.1 g/dL   HCT 45.7 38.5 - 50.0 %   MCV 92.3 80.0 - 100.0 fL   MCH 31.7 27.0 - 33.0 pg   MCHC 34.4 32.0 - 36.0 g/dL   RDW 12.4 11.0 - 15.0 %   Platelets 193 140 - 400 Thousand/uL   MPV 11.6 7.5 - 12.5 fL   Neutro Abs 2,953 1,500 - 7,800 cells/uL   Lymphs Abs 2,052 850 - 3,900 cells/uL   Absolute Monocytes 627 200 - 950 cells/uL   Eosinophils Absolute 40 15 - 500 cells/uL   Basophils Absolute 29 0 - 200 cells/uL   Neutrophils Relative % 51.8 %   Total Lymphocyte 36.0 %   Monocytes Relative 11.0 %   Eosinophils Relative 0.7 %   Basophils Relative 0.5 %  Hemoglobin A1c  Result Value Ref Range   Hgb A1c MFr Bld 4.9 <5.7 % of total Hgb   Mean Plasma Glucose 94 mg/dL   eAG (mmol/L) 5.2 mmol/L  PSA  Result Value Ref Range   PSA 0.73 < OR = 4.00 ng/mL      Assessment & Plan:   Problem List Items Addressed This Visit     Aortic atherosclerosis ([place]    On prior CT imaging 02/01/18 Not on statin therapy at this time  Relevant Medications   losartan (COZAAR) 50 MG tablet   Chronic neck pain   Gout    Continue on Allopurinol 484m daily, 3058mx 1.5 dose      Relevant Medications   allopurinol (ZYLOPRIM) 300 MG tablet   Other Relevant Orders   Uric acid   Hypertension    Well-controlled HTN No known complications    Plan:  1. Continue current BP regimen Losartan 5070maily 2. Encourage improved lifestyle - low sodium diet, regular exercise 3. Continue monitor BP outside office, bring readings to next visit, if persistently >140/90 or new symptoms notify office sooner      Relevant Medications   losartan (COZAAR) 50 MG tablet   Primary osteoarthritis of both first carpometacarpal joints   Relevant Orders   COMPLETE METABOLIC PANEL WITH GFR   CBC with Differential/Platelet   Other Visit Diagnoses     Annual physical exam    -  Primary   Relevant Orders   COMPLETE  METABOLIC PANEL WITH GFR   CBC with Differential/Platelet   Lipid panel   Hemoglobin A1c   Essential hypertension       Relevant Medications   losartan (COZAAR) 50 MG tablet   Other Relevant Orders   COMPLETE METABOLIC PANEL WITH GFR   CBC with Differential/Platelet   Screening for colon cancer       Relevant Orders   Ambulatory referral to Gastroenterology   Screening for prostate cancer       Relevant Orders   PSA   Abnormal glucose       Relevant Orders   Hemoglobin A1c   Elevated LDL cholesterol level       Relevant Orders   Lipid panel   TSH   Deviated nasal septum       Relevant Orders   Ambulatory referral to ENT       Updated Health Maintenance information Reviewed recent lab results with patient Encouraged improvement to lifestyle with diet and exercise Goal of weight loss  Referral to [facility] for initial screening colonoscopy.  Referral to ENT for deviated septum eval.  Orders Placed This Encounter  Procedures   COMPLETE METABOLIC PANEL WITH GFR   CBC with Differential/Platelet   Lipid panel    Order Specific Question:   Has the patient fasted?    Answer:   Yes   Hemoglobin A1c   PSA   Uric acid   TSH   Ambulatory referral to Gastroenterology    Referral Priority:   Routine    Referral Type:   Consultation    Referral Reason:   Specialty Services Required    Number of Visits Requested:   1   Ambulatory referral to ENT    Referral Priority:   Routine    Referral Type:   Consultation    Referral Reason:   Specialty Services Required    Requested Specialty:   Otolaryngology    Number of Visits Requested:   1     Meds ordered this encounter  Medications   allopurinol (ZYLOPRIM) 300 MG tablet    Sig: TAKE 1 AND 1/2 TABLETS(450 MG) BY MOUTH DAILY    Dispense:  135 tablet    Refill:  3    Keep future refills on file   losartan (COZAAR) 50 MG tablet    Sig: Take 1 tablet (50 mg total) by mouth daily.    Dispense:  90 tablet    Refill:  3     Keep future  refills on file     Follow up plan: Return in about 1 year (around 09/25/2022) for 1 year Annual Physical AM apt fasting lab after.  Nobie Putnam, DO [facility] 09/24/2021, 8:12 AM

## 2021-09-24 NOTE — Assessment & Plan Note (Signed)
Continue on Allopurinol '450mg'$  daily, '300mg'$  x 1.5 dose

## 2021-09-24 NOTE — Assessment & Plan Note (Signed)
Well-controlled HTN No known complications    Plan:  1. Continue current BP regimen Losartan '50mg'$  daily 2. Encourage improved lifestyle - low sodium diet, regular exercise 3. Continue monitor BP outside office, bring readings to next visit, if persistently >140/90 or new symptoms notify office sooner

## 2021-09-24 NOTE — Assessment & Plan Note (Signed)
On prior CT imaging 02/01/18 Not on statin therapy at this time

## 2021-09-25 LAB — PSA: PSA: 0.75 ng/mL (ref <=4.00)

## 2021-09-25 LAB — CBC WITH DIFFERENTIAL/PLATELET
Absolute Monocytes: 621 cells/uL (ref 200–950)
Basophils Absolute: 29 cells/uL (ref 0–200)
Basophils Relative: 0.5 %
Eosinophils Absolute: 93 cells/uL (ref 15–500)
Eosinophils Relative: 1.6 %
HCT: 46 % (ref 38.5–50.0)
Hemoglobin: 16.5 g/dL (ref 13.2–17.1)
Lymphs Abs: 2018 cells/uL (ref 850–3900)
MCH: 32.6 pg (ref 27.0–33.0)
MCHC: 35.9 g/dL (ref 32.0–36.0)
MCV: 90.9 fL (ref 80.0–100.0)
MPV: 11.4 fL (ref 7.5–12.5)
Monocytes Relative: 10.7 %
Neutro Abs: 3039 cells/uL (ref 1500–7800)
Neutrophils Relative %: 52.4 %
Platelets: 197 10*3/uL (ref 140–400)
RBC: 5.06 10*6/uL (ref 4.20–5.80)
RDW: 11.9 % (ref 11.0–15.0)
Total Lymphocyte: 34.8 %
WBC: 5.8 10*3/uL (ref 3.8–10.8)

## 2021-09-25 LAB — COMPLETE METABOLIC PANEL WITH GFR
AG Ratio: 1.7 (calc) (ref 1.0–2.5)
ALT: 43 U/L (ref 9–46)
AST: 27 U/L (ref 10–35)
Albumin: 4.7 g/dL (ref 3.6–5.1)
Alkaline phosphatase (APISO): 82 U/L (ref 35–144)
BUN/Creatinine Ratio: 27 (calc) — ABNORMAL HIGH (ref 6–22)
BUN: 18 mg/dL (ref 7–25)
CO2: 25 mmol/L (ref 20–32)
Calcium: 9.2 mg/dL (ref 8.6–10.3)
Chloride: 102 mmol/L (ref 98–110)
Creat: 0.66 mg/dL — ABNORMAL LOW (ref 0.70–1.35)
Globulin: 2.7 g/dL (calc) (ref 1.9–3.7)
Glucose, Bld: 95 mg/dL (ref 65–99)
Potassium: 4 mmol/L (ref 3.5–5.3)
Sodium: 139 mmol/L (ref 135–146)
Total Bilirubin: 1.1 mg/dL (ref 0.2–1.2)
Total Protein: 7.4 g/dL (ref 6.1–8.1)
eGFR: 106 mL/min/{1.73_m2} (ref >=60)

## 2021-09-25 LAB — LIPID PANEL
Cholesterol: 183 mg/dL (ref <200)
HDL: 46 mg/dL (ref >=40)
LDL Cholesterol (Calc): 118 mg/dL (calc) — ABNORMAL HIGH
Non-HDL Cholesterol (Calc): 137 mg/dL (calc) — ABNORMAL HIGH (ref <130)
Total CHOL/HDL Ratio: 4 (calc) (ref <5.0)
Triglycerides: 89 mg/dL (ref <150)

## 2021-09-25 LAB — HEMOGLOBIN A1C
Hgb A1c MFr Bld: 5 % of total Hgb (ref <5.7)
Mean Plasma Glucose: 97 mg/dL
eAG (mmol/L): 5.4 mmol/L

## 2021-09-25 LAB — URIC ACID: Uric Acid, Serum: 4.7 mg/dL (ref 4.0–8.0)

## 2021-09-25 LAB — TSH: TSH: 1.56 mIU/L (ref 0.40–4.50)

## 2021-09-26 ENCOUNTER — Other Ambulatory Visit: Payer: Self-pay

## 2021-09-26 ENCOUNTER — Telehealth: Payer: Self-pay

## 2021-09-26 DIAGNOSIS — Z1211 Encounter for screening for malignant neoplasm of colon: Secondary | ICD-10-CM

## 2021-09-26 MED ORDER — GOLYTELY 236 G PO SOLR: 4000.0000 mL | Freq: Once | ORAL | 0 refills | Status: AC

## 2021-09-26 NOTE — Telephone Encounter (Signed)
Gastroenterology Pre-Procedure Review  Request Date: 11/09/21 Requesting Physician: Dr. Vicente Males  PATIENT REVIEW QUESTIONS: The patient responded to the following health history questions as indicated:    1. Are you having any GI issues? no 2. Do you have a personal history of Polyps? no 3. Do you have a family history of Colon Cancer or Polyps? no 4. Diabetes Mellitus? no 5. Joint replacements in the past 12 months?no 6. Major health problems in the past 3 months?no 7. Any artificial heart valves, MVP, or defibrillator?no    MEDICATIONS & ALLERGIES:    Patient reports the following regarding taking any anticoagulation/antiplatelet therapy:   Plavix, Coumadin, Eliquis, Xarelto, Lovenox, Pradaxa, Brilinta, or Effient? no Aspirin? no  Patient confirms/reports the following medications:  Current Outpatient Medications  Medication Sig Dispense Refill   allopurinol (ZYLOPRIM) 300 MG tablet TAKE 1 AND 1/2 TABLETS(450 MG) BY MOUTH DAILY 135 tablet 3   ipratropium (ATROVENT) 0.06 % nasal spray Place 2 sprays into both nostrils 4 (four) times daily. 15 mL 12   losartan (COZAAR) 50 MG tablet Take 1 tablet (50 mg total) by mouth daily. 90 tablet 3   No current facility-administered medications for this visit.    Patient confirms/reports the following allergies:  Allergies  Allergen Reactions   Amlodipine Swelling   Morphine And Related Itching    No orders of the defined types were placed in this encounter.   AUTHORIZATION INFORMATION Primary Insurance: 1D#: Group #:  Secondary Insurance: 1D#: Group #:  SCHEDULE INFORMATION: Date: 11/09/21 Time: Location: [facility]

## 2021-11-01 ENCOUNTER — Encounter: Payer: Self-pay | Admitting: Family Medicine

## 2021-11-01 DIAGNOSIS — G8929 Other chronic pain: Secondary | ICD-10-CM

## 2021-11-01 MED ORDER — TIZANIDINE HCL 4 MG PO TABS: 2.0000 mg | ORAL_TABLET | Freq: Three times a day (TID) | ORAL | 0 refills | Status: DC | PRN

## 2021-11-02 ENCOUNTER — Ambulatory Visit: Payer: BC Managed Care – PPO | Admitting: Internal Medicine

## 2021-11-08 ENCOUNTER — Encounter: Payer: Self-pay | Admitting: Gastroenterology

## 2021-11-09 ENCOUNTER — Ambulatory Visit
Admission: RE | Admit: 2021-11-09 | Discharge: 2021-11-09 | Disposition: A | Discharge status: Home / Self Care | Payer: BC Managed Care – PPO | Attending: Gastroenterology | Admitting: Gastroenterology

## 2021-11-09 ENCOUNTER — Other Ambulatory Visit: Payer: Self-pay

## 2021-11-09 ENCOUNTER — Encounter: Payer: Self-pay | Admitting: Gastroenterology

## 2021-11-09 ENCOUNTER — Ambulatory Visit: Payer: BC Managed Care – PPO | Admitting: Anesthesiology

## 2021-11-09 ENCOUNTER — Encounter
Admission: RE | Disposition: A | Discharge status: Home / Self Care | Payer: Self-pay | Source: Home / Self Care | Attending: Gastroenterology

## 2021-11-09 DIAGNOSIS — D126 Benign neoplasm of colon, unspecified: Secondary | ICD-10-CM | POA: Diagnosis not present

## 2021-11-09 DIAGNOSIS — Z1211 Encounter for screening for malignant neoplasm of colon: Secondary | ICD-10-CM | POA: Diagnosis not present

## 2021-11-09 DIAGNOSIS — K635 Polyp of colon: Secondary | ICD-10-CM | POA: Diagnosis not present

## 2021-11-09 DIAGNOSIS — D125 Benign neoplasm of sigmoid colon: Secondary | ICD-10-CM | POA: Insufficient documentation

## 2021-11-09 DIAGNOSIS — D122 Benign neoplasm of ascending colon: Secondary | ICD-10-CM | POA: Diagnosis not present

## 2021-11-09 DIAGNOSIS — I10 Essential (primary) hypertension: Secondary | ICD-10-CM | POA: Insufficient documentation

## 2021-11-09 DIAGNOSIS — M109 Gout, unspecified: Secondary | ICD-10-CM | POA: Insufficient documentation

## 2021-11-09 HISTORY — PX: COLONOSCOPY WITH PROPOFOL: SHX5780

## 2021-11-09 SURGERY — COLONOSCOPY WITH PROPOFOL
Anesthesia: General

## 2021-11-09 MED ORDER — SODIUM CHLORIDE 0.9 % IV SOLN: INTRAVENOUS | Status: DC

## 2021-11-09 MED ORDER — PROPOFOL 10 MG/ML IV BOLUS
INTRAVENOUS | Status: DC | PRN
  Administered 2021-11-09: 100 mg via INTRAVENOUS

## 2021-11-09 MED ORDER — PROPOFOL 500 MG/50ML IV EMUL
INTRAVENOUS | Status: DC | PRN
  Administered 2021-11-09: 150 ug/kg/min via INTRAVENOUS

## 2021-11-09 MED ORDER — LIDOCAINE 2% (20 MG/ML) 5 ML SYRINGE
INTRAMUSCULAR | Status: DC | PRN
  Administered 2021-11-09: 50 mg via INTRAVENOUS

## 2021-11-09 NOTE — H&P (Signed)
Jonathon Bellows, MD [address], [place], [place], [place], [address] [address], [place], [place], [place], [address] Phone: [phone]  Fax: [phone]  Primary Care Physician:  Olin Hauser, DO   Pre-Procedure History & Physical: HPI:  Kurt Mendoza is a 61 y.o. male is here for an colonoscopy.   Past Medical History:  Diagnosis Date   Esophagitis    Gout    History of irregular heartbeat    Hypertension     Past Surgical History:  Procedure Laterality Date   BACK SURGERY  1997   KNEE SURGERY  2014   LAPAROSCOPIC APPENDECTOMY N/A 02/02/2018   Procedure: APPENDECTOMY LAPAROSCOPIC;  Surgeon: Herbert Pun, MD;  Location: [facility] ORS;  Service: General;  Laterality: N/A;    Prior to Admission medications   Medication Sig Start Date End Date Taking? Authorizing Provider  allopurinol (ZYLOPRIM) 300 MG tablet TAKE 1 AND 1/2 TABLETS(450 MG) BY MOUTH DAILY 09/24/21  Yes Karamalegos, Alexander J, DO  ipratropium (ATROVENT) 0.06 % nasal spray Place 2 sprays into both nostrils 4 (four) times daily. 07/02/21  Yes Margarette Canada, NP  losartan (COZAAR) 50 MG tablet Take 1 tablet (50 mg total) by mouth daily. 09/24/21  Yes Karamalegos, Devonne Doughty, DO  tiZANidine (ZANAFLEX) 4 MG tablet Take 0.5-1 tablets (2-4 mg total) by mouth every 8 (eight) hours as needed for muscle spasms. 11/01/21  Yes Karamalegos, Devonne Doughty, DO    Allergies as of 09/26/2021 - Review Complete 09/24/2021  Allergen Reaction Noted   Amlodipine Swelling 03/31/2015   Morphine and related Itching 03/31/2015    Family History  Problem Relation Age of Onset   Hyperlipidemia Mother    Stroke Father    Hyperlipidemia Father    Diabetes Father    Heart disease Father    Thyroid disease Sister    Thyroid disease Brother     Social History   Socioeconomic History   Marital status: Married    Spouse name: Lattie Haw   Number of children: Not on file   Years of education: High School   Highest  education level: High school graduate  Occupational History   Not on file  Tobacco Use   Smoking status: Some Days    Types: Cigars   Smokeless tobacco: Never  Vaping Use   Vaping Use: Never used  Substance and Sexual Activity   Alcohol use: Yes    Alcohol/week: 3.0 standard drinks of alcohol    Types: 3 Standard drinks or equivalent per week    Comment: week end   Drug use: No   Sexual activity: Not on file  Other Topics Concern   Not on file  Social History Narrative   Not on file   Social Determinants of Health   Financial Resource Strain: Not on file  Food Insecurity: Not on file  Transportation Needs: Not on file  Physical Activity: Not on file  Stress: Not on file  Social Connections: Not on file  Intimate Partner Violence: Not on file    Review of Systems: See HPI, otherwise negative ROS  Physical Exam: BP (!) 137/90   Pulse 61   Temp (!) 97.2 F (36.2 C) (Temporal)   Ht '6\' 1"'$  (1.854 m)   Wt 99.8 kg   SpO2 100%   BMI 29.03 kg/m  General:   Alert,  pleasant and cooperative in NAD Head:  Normocephalic and atraumatic. Neck:  Supple; no masses or thyromegaly. Lungs:  Clear throughout to auscultation, normal  respiratory effort.    Heart:  +S1, +S2, Regular rate and rhythm, No edema. Abdomen:  Soft, nontender and nondistended. Normal bowel sounds, without guarding, and without rebound.   Neurologic:  Alert and  oriented x4;  grossly normal neurologically.  Impression/Plan: Kurt Mendoza is here for an colonoscopy to be performed for Screening colonoscopy average risk   Risks, benefits, limitations, and alternatives regarding  colonoscopy have been reviewed with the patient.  Questions have been answered.  All parties agreeable.   Jonathon Bellows, MD  11/09/2021, 8:57 AM

## 2021-11-09 NOTE — Op Note (Signed)
[facility] Gastroenterology Patient Name: Stylianos Stradling Procedure Date: 11/09/2021 9:02 AM MRN: [id number] Account #: [id number] Date of Birth: 1961-01-27 Admit Type: Outpatient Age: 61 Room: [facility] ENDO ROOM 4 Gender: Male Note Status: Finalized Instrument Name: Jasper Riling [id number] Procedure:             Colonoscopy Indications:           Screening for colorectal malignant neoplasm Providers:             Jonathon Bellows MD, MD Referring MD:          Olin Hauser (Referring MD) Medicines:             Monitored Anesthesia Care Complications:         No immediate complications. Procedure:             Pre-Anesthesia Assessment:                        - Prior to the procedure, a History and Physical was                         performed, and patient medications, allergies and                         sensitivities were reviewed. The patient's tolerance                         of previous anesthesia was reviewed.                        - The risks and benefits of the procedure and the                         sedation options and risks were discussed with the                         patient. All questions were answered and informed                         consent was obtained.                        - ASA Grade Assessment: II - A patient with mild                         systemic disease.                        After obtaining informed consent, the colonoscope was                         passed under direct vision. Throughout the procedure,                         the patient's blood pressure, pulse, and oxygen                         saturations were monitored continuously. The                         Colonoscope was introduced  through the anus and                         advanced to the the cecum, identified by the                         appendiceal orifice. The colonoscopy was performed                         with ease. The patient tolerated the procedure  well.                         The quality of the bowel preparation was excellent. Findings:      The perianal and digital rectal examinations were normal.      Two sessile polyps were found in the ascending colon. The polyps were 6       to 8 mm in size. These polyps were removed with a cold snare. Resection       and retrieval were complete.      A 5 mm polyp was found in the sigmoid colon. The polyp was sessile. The       polyp was removed with a cold snare. Resection and retrieval were       complete.      The exam was otherwise without abnormality on direct and retroflexion       views. Impression:            - Two 6 to 8 mm polyps in the ascending colon, removed                         with a cold snare. Resected and retrieved.                        - One 5 mm polyp in the sigmoid colon, removed with a                         cold snare. Resected and retrieved.                        - The examination was otherwise normal on direct and                         retroflexion views. Recommendation:        - Discharge patient to home (with escort).                        - Resume previous diet.                        - Continue present medications.                        - Await pathology results.                        - Repeat colonoscopy in 3 years for surveillance. Procedure Code(s):     --- Professional ---                        [phone], Colonoscopy, flexible; with removal  of                         tumor(s), polyp(s), or other lesion(s) by snare                         technique Diagnosis Code(s):     --- Professional ---                        K63.5, Polyp of colon                        Z12.11, Encounter for screening for malignant neoplasm                         of colon CPT copyright 2019 American [employer] Association. All rights reserved. The codes documented in this report are preliminary and upon coder review may  be revised to meet current compliance  requirements. Jonathon Bellows, MD Jonathon Bellows MD, MD 11/09/2021 9:21:39 AM This report has been signed electronically. Number of Addenda: 0 Note Initiated On: 11/09/2021 9:02 AM Scope Withdrawal Time: 0 hours 10 minutes 16 seconds  Total Procedure Duration: 0 hours 13 minutes 56 seconds  Estimated Blood Loss:  Estimated blood loss: none.      [facility]

## 2021-11-09 NOTE — Anesthesia Postprocedure Evaluation (Signed)
Anesthesia Post Note  Patient: Kurt Mendoza  Procedure(s) Performed: COLONOSCOPY WITH PROPOFOL  Patient location during evaluation: PACU Anesthesia Type: General Level of consciousness: awake and alert, oriented and patient cooperative Pain management: pain level controlled Vital Signs Assessment: post-procedure vital signs reviewed and stable Respiratory status: spontaneous breathing, nonlabored ventilation and respiratory function stable Cardiovascular status: blood pressure returned to baseline and stable Postop Assessment: adequate PO intake Anesthetic complications: no   No notable events documented.   Last Vitals:  Vitals:   11/09/21 0935 11/09/21 0945  BP: 122/79 128/85  Pulse: 67 61  Resp: 15 18  Temp:    SpO2: 99% 100%    Last Pain:  Vitals:   11/09/21 0945  TempSrc:   PainSc: 0-No pain                 [name]

## 2021-11-09 NOTE — Anesthesia Preprocedure Evaluation (Addendum)
Anesthesia Evaluation  Patient identified by MRN, date of birth, ID band Patient awake    Reviewed: Allergy & Precautions, NPO status , Patient's Chart, lab work & pertinent test results  History of Anesthesia Complications Negative for: history of anesthetic complications  Airway Mallampati: IV   Neck ROM: Full    Dental no notable dental hx.    Pulmonary neg pulmonary ROS,    Pulmonary exam normal breath sounds clear to auscultation       Cardiovascular hypertension, Normal cardiovascular exam Rhythm:Regular Rate:Normal     Neuro/Psych negative neurological ROS     GI/Hepatic negative GI ROS,   Endo/Other  negative endocrine ROS  Renal/GU negative Renal ROS     Musculoskeletal Gout    Abdominal   Peds  Hematology negative hematology ROS (+)   Anesthesia Other Findings   Reproductive/Obstetrics                            Anesthesia Physical Anesthesia Plan  ASA: 2  Anesthesia Plan: General   Post-op Pain Management:    Induction: Intravenous  PONV Risk Score and Plan: 2 and Propofol infusion, TIVA and Treatment may vary due to age or medical condition  Airway Management Planned: Natural Airway  Additional Equipment:   Intra-op Plan:   Post-operative Plan:   Informed Consent: I have reviewed the patients History and Physical, chart, labs and discussed the procedure including the risks, benefits and alternatives for the proposed anesthesia with the patient or authorized representative who has indicated his/her understanding and acceptance.       Plan Discussed with: CRNA  Anesthesia Plan Comments: (LMA/GETA backup discussed.  Patient consented for risks of anesthesia including but not limited to:  - adverse reactions to medications - damage to eyes, teeth, lips or other oral mucosa - nerve damage due to positioning  - sore throat or hoarseness - damage to heart,  brain, nerves, lungs, other parts of body or loss of life  Informed patient about role of CRNA in peri- and intra-operative care.  Patient voiced understanding.)        Anesthesia Quick Evaluation

## 2021-11-09 NOTE — Transfer of Care (Signed)
Immediate Anesthesia Transfer of Care Note  Patient: Kurt Mendoza  Procedure(s) Performed: COLONOSCOPY WITH PROPOFOL  Patient Location: Endoscopy Unit  Anesthesia Type:General  Level of Consciousness: drowsy  Airway & Oxygen Therapy: Patient Spontanous Breathing  Post-op Assessment: Report given to RN and Post -op Vital signs reviewed and stable  Post vital signs: Reviewed and stable  Last Vitals:  Vitals Value Taken Time  BP 122/79   Temp    Pulse 64 11/09/21 0925  Resp 14 11/09/21 0925  SpO2 96 % 11/09/21 0925  Vitals shown include unvalidated device data.  Last Pain:  Vitals:   11/09/21 0849  TempSrc: Temporal  PainSc: 0-No pain         Complications: No notable events documented.

## 2021-11-12 ENCOUNTER — Encounter: Payer: Self-pay | Admitting: Gastroenterology

## 2021-11-12 LAB — SURGICAL PATHOLOGY

## 2021-11-13 ENCOUNTER — Encounter: Payer: Self-pay | Admitting: Gastroenterology

## 2021-11-14 ENCOUNTER — Ambulatory Visit: Payer: BC Managed Care – PPO | Admitting: Family Medicine

## 2021-11-24 ENCOUNTER — Other Ambulatory Visit: Payer: Self-pay | Admitting: Family Medicine

## 2021-11-24 DIAGNOSIS — I10 Essential (primary) hypertension: Secondary | ICD-10-CM

## 2021-11-26 NOTE — Telephone Encounter (Signed)
Refilled 09/24/2021 #90 3 rf. Requested Prescriptions  Pending Prescriptions Disp Refills  . losartan (COZAAR) 50 MG tablet [Pharmacy Med Name: LOSARTAN '50MG'$  TABLETS] 90 tablet 3    Sig: TAKE 1 TABLET BY MOUTH DAILY     Cardiovascular:  Angiotensin Receptor Blockers Failed - 11/24/2021  8:48 AM      Failed - Cr in normal range and within 180 days    Creat  Date Value Ref Range Status  09/24/2021 0.66 (L) 0.70 - 1.35 mg/dL Final         Passed - K in normal range and within 180 days    Potassium  Date Value Ref Range Status  09/24/2021 4.0 3.5 - 5.3 mmol/L Final         Passed - Patient is not pregnant      Passed - Last BP in normal range    BP Readings from Last 1 Encounters:  11/09/21 128/85         Passed - Valid encounter within last 6 months    Recent Outpatient Visits          2 months ago Annual physical exam   [place], DO   1 year ago Annual physical exam   [facility] Olin Hauser, DO   2 years ago Acute gout of left ankle, unspecified cause   [facility] Bernice, Devonne Doughty, DO   2 years ago Essential hypertension   [place], Henrine Screws T, NP   3 years ago Arthritis of both hands   [facility] Crissman, Jeannette How, MD

## 2021-12-30 ENCOUNTER — Other Ambulatory Visit: Payer: Self-pay | Admitting: Family Medicine

## 2021-12-30 DIAGNOSIS — M1A00X0 Idiopathic chronic gout, unspecified site, without tophus (tophi): Secondary | ICD-10-CM

## 2021-12-31 NOTE — Telephone Encounter (Signed)
Requested Prescriptions  Pending Prescriptions Disp Refills   allopurinol (ZYLOPRIM) 300 MG tablet [Pharmacy Med Name: ALLOPURINOL '300MG'$  TABLETS] 135 tablet 2    Sig: TAKE 1 AND 1/2 TABLETS(450 MG) BY MOUTH DAILY     Endocrinology:  Gout Agents - allopurinol Failed - 12/30/2021  9:09 AM      Failed - Cr in normal range and within 360 days    Creat  Date Value Ref Range Status  09/24/2021 0.66 (L) 0.70 - 1.35 mg/dL Final         Passed - Uric Acid in normal range and within 360 days    Uric Acid, Serum  Date Value Ref Range Status  09/24/2021 4.7 4.0 - 8.0 mg/dL Final    Comment:    Therapeutic target for gout patients: <6.0 mg/dL .    Uric Acid  Date Value Ref Range Status  03/02/2019 8.8 (H) 3.8 - 8.4 mg/dL Final    Comment:               Therapeutic target for gout patients: <6.0         Passed - Valid encounter within last 12 months    Recent Outpatient Visits           3 months ago Annual physical exam   [facility] Olin Hauser, DO   1 year ago Annual physical exam   [facility] Olin Hauser, DO   2 years ago Acute gout of left ankle, unspecified cause   [facility], Devonne Doughty, DO   2 years ago Essential hypertension   [place], Henrine Screws T, NP   3 years ago Arthritis of both hands   [place] Crissman, Jeannette How, MD              Passed - CBC within normal limits and completed in the last 12 months    WBC  Date Value Ref Range Status  09/24/2021 5.8 3.8 - 10.8 Thousand/uL Final   RBC  Date Value Ref Range Status  09/24/2021 5.06 4.20 - 5.80 Million/uL Final   Hemoglobin  Date Value Ref Range Status  09/24/2021 16.5 13.2 - 17.1 g/dL Final   HCT  Date Value Ref Range Status  09/24/2021 46.0 38.5 - 50.0 % Final   MCHC  Date Value Ref Range Status  09/24/2021 35.9 32.0 - 36.0 g/dL Final   [facility]  Date Value Ref Range Status   09/24/2021 32.6 27.0 - 33.0 pg Final   MCV  Date Value Ref Range Status  09/24/2021 90.9 80.0 - 100.0 fL Final   No results found for: "PLTCOUNTKUC", "LABPLAT", "POCPLA" RDW  Date Value Ref Range Status  09/24/2021 11.9 11.0 - 15.0 % Final

## 2022-01-03 DIAGNOSIS — D2261 Melanocytic nevi of right upper limb, including shoulder: Secondary | ICD-10-CM | POA: Diagnosis not present

## 2022-01-03 DIAGNOSIS — D225 Melanocytic nevi of trunk: Secondary | ICD-10-CM | POA: Diagnosis not present

## 2022-01-03 DIAGNOSIS — D2272 Melanocytic nevi of left lower limb, including hip: Secondary | ICD-10-CM | POA: Diagnosis not present

## 2022-01-03 DIAGNOSIS — D2262 Melanocytic nevi of left upper limb, including shoulder: Secondary | ICD-10-CM | POA: Diagnosis not present

## 2022-02-25 ENCOUNTER — Ambulatory Visit: Payer: BC Managed Care – PPO | Admitting: Family Medicine

## 2022-05-20 ENCOUNTER — Encounter: Payer: Self-pay | Admitting: Family Medicine

## 2022-05-20 ENCOUNTER — Ambulatory Visit (INDEPENDENT_AMBULATORY_CARE_PROVIDER_SITE_OTHER): Payer: BC Managed Care – PPO | Admitting: Family Medicine

## 2022-05-20 VITALS — BP 138/82 | HR 62 | Temp 96.8°F | Wt 236.0 lb

## 2022-05-20 DIAGNOSIS — I2089 Other forms of angina pectoris: Secondary | ICD-10-CM | POA: Diagnosis not present

## 2022-05-20 DIAGNOSIS — I10 Essential (primary) hypertension: Secondary | ICD-10-CM

## 2022-05-20 DIAGNOSIS — F419 Anxiety disorder, unspecified: Secondary | ICD-10-CM

## 2022-05-20 DIAGNOSIS — R0789 Other chest pain: Secondary | ICD-10-CM

## 2022-05-20 DIAGNOSIS — M542 Cervicalgia: Secondary | ICD-10-CM

## 2022-05-20 DIAGNOSIS — G8929 Other chronic pain: Secondary | ICD-10-CM

## 2022-05-20 MED ORDER — TIZANIDINE HCL 4 MG PO TABS: 2.0000 mg | ORAL_TABLET | Freq: Three times a day (TID) | ORAL | 2 refills | Status: DC | PRN

## 2022-05-20 MED ORDER — ESCITALOPRAM OXALATE 10 MG PO TABS: 10.0000 mg | ORAL_TABLET | Freq: Every day | ORAL | 2 refills | Status: DC

## 2022-05-20 NOTE — Patient Instructions (Addendum)
Thank you for coming to the office today.  Start Escitalopram (Lexapro) 10mg  daily for mood/anxiety to help provide relief, may take a few weeks for it to start working.  Refilled Tizanidine to use as needed  EKG today  Referral for Coronary Calcium CT Score near the hospital imaging test.  Referral to Cardiologist for further evaluation.  [facility]) HeartCare at [place], [place] [address] Main: [phone]   If you have any significant chest pain that does not go away within 30 minutes, is accompanied by nausea, sweating, shortness of breath, or made worse by activity, this may be evidence of a heart attack, especially if symptoms worsening instead of improving, please call 911 or go directly to the emergency room immediately for evaluation.    Please schedule a Follow-up Appointment to: Return in about 6 weeks (around 07/01/2022) for 4-6 weeks follow-up anxiety Cardiology.  If you have any other questions or concerns, please feel free to call the office or send a message through [place]. You may also schedule an earlier appointment if necessary.  Additionally, you may be receiving a survey about your experience at our office within a few days to 1 week by e-mail or mail. We value your feedback.  Nobie Putnam, DO [place]

## 2022-05-20 NOTE — Progress Notes (Addendum)
Subjective:    Patient ID: Kurt Mendoza, male    DOB: 11-14-1960, 62 y.o.   MRN: [id number]  Kurt Mendoza is a 62 y.o. male presenting on 05/20/2022 for Anxiety and Chest Pain (/)   HPI  Chest tightness, atypical anginal equivalent Anxiety vs panic  He reports episode 1-2 x per week now, with feeling "off" and no particular trigger he can attribute to but feels maybe anxiety or blood pressure causing. He describes episode of chest tightness He has difficulty relaxing and feels hands and neck hurting Left should pain acutely, was wearing heating pad at night  Anxiety panic sense of "dread"  Taking OTC ZyQuil to help rest at night  CHRONIC HTN: Doing well home BP checks SBP 130s avg Current Meds - Losartan 50mg  daily Reports good compliance, took meds today. Tolerating well, w/o complaints. Denies CP, dyspnea, HA, edema, dizziness / lightheadedness  PMH Erectile Dysfunction      05/20/2022    4:05 PM 09/24/2021    8:44 AM 09/18/2020    1:32 PM  Depression screen PHQ 2/9  Decreased Interest 1 0 0  Down, Depressed, Hopeless 1 0 0  PHQ - 2 Score 2 0 0  Altered sleeping 1 0 0  Tired, decreased energy 2 0 0  Change in appetite 0 0 0  Feeling bad or failure about yourself  0 0 0  Trouble concentrating 0 0 0  Moving slowly or fidgety/restless 0 0 0  Suicidal thoughts 0 0 0  PHQ-9 Score 5 0 0  Difficult doing work/chores Not difficult at all Not difficult at all Not difficult at all    Social History   Tobacco Use   Smoking status: Some Days    Types: Cigars   Smokeless tobacco: Never  Vaping Use   Vaping Use: Never used  Substance Use Topics   Alcohol use: Yes    Alcohol/week: 3.0 standard drinks of alcohol    Types: 3 Standard drinks or equivalent per week    Comment: week end   Drug use: No    Review of Systems Per HPI unless specifically indicated above     Objective:    BP 138/82 (BP Location: Left Arm, Patient Position: Sitting, Cuff Size: Large)    Pulse 62   Temp (!) 96.8 F (36 C) (Temporal)   Wt 236 lb (107 kg)   SpO2 98%   BMI 31.14 kg/m   Wt Readings from Last 3 Encounters:  05/20/22 236 lb (107 kg)  11/09/21 220 lb (99.8 kg)  09/24/21 229 lb 12.8 oz (104.2 kg)    Physical Exam Vitals and nursing note reviewed.  Constitutional:      General: He is not in acute distress.    Appearance: He is well-developed. He is not diaphoretic.     Comments: Well-appearing, comfortable, cooperative  HENT:     Head: Normocephalic and atraumatic.  Eyes:     General:        Right eye: No discharge.        Left eye: No discharge.     Conjunctiva/sclera: Conjunctivae normal.  Neck:     Thyroid: No thyromegaly.  Cardiovascular:     Rate and Rhythm: Normal rate and regular rhythm.     Pulses: Normal pulses.     Heart sounds: Normal heart sounds. No murmur heard. Pulmonary:     Effort: Pulmonary effort is normal. No respiratory distress.     Breath sounds: Normal breath sounds. No wheezing or  rales.  Musculoskeletal:        General: Normal range of motion.     Cervical back: Normal range of motion and neck supple.  Lymphadenopathy:     Cervical: No cervical adenopathy.  Skin:    General: Skin is warm and dry.     Findings: No erythema or rash.  Neurological:     Mental Status: He is alert and oriented to person, place, and time. Mental status is at baseline.  Psychiatric:        Behavior: Behavior normal.     Comments: Well groomed, good eye contact, normal speech and thoughts    EKG - performed in office today  Date: 05/20/22  Rate: 62  Rhythm: normal sinus rhythm and premature ventricular contractions (PVC)  QRS Axis: normal  Intervals: normal  ST/T Wave abnormalities: normal  Conduction Disutrbances:none  Additional Narrative Interpretation: none  Old EKG Reviewed: unchanged 2017     Results for orders placed or performed during the hospital encounter of 11/09/21  Surgical pathology  Result Value Ref Range    SURGICAL PATHOLOGY      SURGICAL PATHOLOGY CASE: [id number] PATIENT: Kurt Mendoza Surgical Pathology Report     Specimen Submitted: A. Colon polyp x2, ascending; cold snare B. Colon polyp, sigmoid; cold snare  Clinical History: Screening colonoscopy.  Colon polyps      DIAGNOSIS: A. COLON POLYP X 2 ASCENDING; BIOPSY: - FRAGMENTS OF TUBULAR ADENOMA. - NEGATIVE FOR HIGH-GRADE DYSPLASIA AND MALIGNANCY.  B. COLON POLYP, SIGMOID; BIOPSY: - TUBULAR ADENOMA. - NEGATIVE FOR HIGH-GRADE DYSPLASIA AND MALIGNANCY.  GROSS DESCRIPTION: A. Labeled: Cold snare ascending colon polyp x 2 Received: Formalin Collection time: 9:09 AM on 11/09/2021 Placed into formalin time: 9:09 AM on 11/09/2021 Tissue fragment(s): Multiple Size: Aggregate, 1.7 x 0.9 x 0.2 cm Description: Received are fragments of tan-pink soft tissue admixed with intestinal debris.  The ratio of soft tissue to intestinal debris is 70: 30. Entirely submitted in 1 cassette.  B. Labeled: Cold snare sigmoid colon polyp Recei ved: Formalin Collection time: 9:18 AM on 11/09/2021 Placed into formalin time: 9:18 AM on 11/09/2021 Tissue fragment(s): Multiple Size: Aggregate, 0.6 x 0.4 x 0.1 cm Description: Received is a single fragment of tan translucent soft tissue admixed with intestinal debris.  The ratio of soft tissue to intestinal debris is 70: 30. Entirely submitted in 1 cassette.  RB 11/09/2021  Final Diagnosis performed by Raynelle Bring, MD.   Electronically signed 11/12/2021 8:16:53AM The electronic signature indicates that the named Attending Pathologist has evaluated the specimen Technical component performed at [facility], [address], [place], [place] [address] Lab: [phone] Dir: Rush Farmer, MD, MMM  Professional component performed at [facility], [facility], [place], [place], [place] [address] Lab: [phone] Dir: Kathi Simpers, MD       Assessment & Plan:   Problem  List Items Addressed This Visit     Chronic neck pain   Relevant Medications   escitalopram (LEXAPRO) 10 MG tablet   tiZANidine (ZANAFLEX) 4 MG tablet   Other Visit Diagnoses     Chest tightness    -  Primary   Relevant Orders   Ambulatory referral to Cardiology   CT CARDIAC SCORING (SELF PAY ONLY)   Essential hypertension       Relevant Orders   Ambulatory referral to Cardiology   CT CARDIAC SCORING (SELF PAY ONLY)   Anxiety       Relevant Medications   escitalopram (LEXAPRO) 10 MG tablet  Anginal equivalent       Relevant Orders   Ambulatory referral to Cardiology   CT CARDIAC SCORING (SELF PAY ONLY)       Discussion today with constellation of subacute symptoms with atypical chest tightness anginal equivalent but not having chest pain. No active symptoms today. He has had episodic worsening flares that are difficult to discern.  He is attributing a lot of his symptoms to anxiety and stress and blood pressure His BP is normal today He has never been treated for anxiety before  I advised based on age and cardiovascular risk factors, I strongly recommend evaluating his heart first to rule out ischemic cause of his symptoms first.  I will start with SSRI for anxiety management. Start Escitalopram (Lexapro) 10mg  daily for mood/anxiety to help provide relief, may take a few weeks for it to start working.  Refilled Tizanidine to use as needed  EKG today Results reviewed, updated above, negative. Compared to 2017-2019  Urgent Referral to Cardiologist for further evaluation. - scheduled already for tomorrow 05/21/22 [facility]) HeartCare at [place], [place] [address] Main: [phone]   Originally I ordered CT Coronary Calcium score - but since he will see Cardiologist tomorrow morning, they may change or update the order depending on indication.  If you have any significant chest pain that  does not go away within 30 minutes, is accompanied by nausea, sweating, shortness of breath, or made worse by activity, this may be evidence of a heart attack, especially if symptoms worsening instead of improving, please call 911 or go directly to the emergency room immediately for evaluation.   Orders Placed This Encounter  Procedures   CT CARDIAC SCORING (SELF PAY ONLY)    Standing Status:   Future    Standing Expiration Date:   05/20/2023    Order Specific Question:   Preferred imaging location?    Answer:   [facility]   Ambulatory referral to Cardiology    Referral Priority:   Urgent    Referral Type:   Consultation    Referral Reason:   Specialty Services Required    Number of Visits Requested:   1      Meds ordered this encounter  Medications   escitalopram (LEXAPRO) 10 MG tablet    Sig: Take 1 tablet (10 mg total) by mouth daily.    Dispense:  30 tablet    Refill:  2   tiZANidine (ZANAFLEX) 4 MG tablet    Sig: Take 0.5-1 tablets (2-4 mg total) by mouth every 8 (eight) hours as needed for muscle spasms.    Dispense:  60 tablet    Refill:  2     Follow up plan: Return in about 6 weeks (around 07/01/2022) for 4-6 weeks follow-up anxiety Cardiology.   Nobie Putnam, [facility] 05/20/2022, 4:00 PM

## 2022-05-21 ENCOUNTER — Encounter: Payer: Self-pay | Admitting: Cardiology

## 2022-05-21 ENCOUNTER — Ambulatory Visit: Payer: BC Managed Care – PPO | Attending: Cardiology | Admitting: Cardiology

## 2022-05-21 VITALS — BP 152/86 | HR 72 | Ht 74.0 in | Wt 239.0 lb

## 2022-05-21 DIAGNOSIS — R079 Chest pain, unspecified: Secondary | ICD-10-CM

## 2022-05-21 DIAGNOSIS — R072 Precordial pain: Secondary | ICD-10-CM

## 2022-05-21 DIAGNOSIS — I10 Essential (primary) hypertension: Secondary | ICD-10-CM

## 2022-05-21 MED ORDER — METOPROLOL TARTRATE 100 MG PO TABS: 100.0000 mg | ORAL_TABLET | Freq: Once | ORAL | 0 refills | Status: DC

## 2022-05-21 NOTE — Patient Instructions (Addendum)
Medication Instructions:  Your physician recommends that you continue on your current medications as directed. Please refer to the Current Medication list given to you today.  *If you need a refill on your cardiac medications before your next appointment, please call your pharmacy*   Lab Work:  Your physician recommends that you return to have lab work done at [facility] on 06/04/2022 no later than 06/07/2022.  Monday-Friday   7:00 am- 6:00 pm  BMP  If you have labs (blood work) drawn today and your tests are completely normal, you will receive your results only by: [place] (if you have MyChart) OR A paper copy in the mail If you have any lab test that is abnormal or we need to change your treatment, we will call you to review the results.   Testing/Procedures:    Your cardiac CT will be scheduled at the location below on Thursday, June 13, 2022 @ 2:45 pm.   [facility] [address] [place], [place] [address] [phone]    If scheduled at [facility] , please arrive 15 mins early for check-in and test prep.   Please follow these instructions carefully (unless otherwise directed):  Hold all erectile dysfunction medications at least 3 days (72 hrs) prior to test. (Ie viagra, cialis, sildenafil, tadalafil, etc) We will administer nitroglycerin during this exam.   On the Night Before the Test: Be sure to Drink plenty of water. Do not consume any caffeinated/decaffeinated beverages or chocolate 12 hours prior to your test. Do not take any antihistamines 12 hours prior to your test.   On the Day of the Test: Drink plenty of water until 1 hour prior to the test. Do not eat any food 1 hour prior to test. You may take your regular medications prior to the test.  Take metoprolol (Lopressor) two hours prior to test.        After the Test: Drink plenty of water. After receiving IV  contrast, you may experience a mild flushed feeling. This is normal. On occasion, you may experience a mild rash up to 24 hours after the test. This is not dangerous. If this occurs, you can take Benadryl 25 mg and increase your fluid intake. If you experience trouble breathing, this can be serious. If it is severe call 911 IMMEDIATELY. If it is mild, please call our office.    Your physician has requested that you have an echocardiogram. Echocardiography is a painless test that uses sound waves to create images of your heart. It provides your doctor with information about the size and shape of your heart and how well your heart's chambers and valves are working. This procedure takes approximately one hour. There are no restrictions for this procedure. Please do NOT wear cologne, perfume, aftershave, or lotions (deodorant is allowed). Please arrive 15 minutes prior to your appointment time.     Follow-Up: At [facility], you and your health needs are our priority.  As part of our continuing mission to provide you with exceptional heart care, we have created designated Provider Care Teams.  These Care Teams include your primary Cardiologist (physician) and Advanced Practice Providers (APPs -  Physician Assistants and Nurse Practitioners) who all work together to provide you with the care you need, when you need it.  We recommend signing up for the patient portal called "MyChart".  Sign up information is provided on this After Visit Summary.  MyChart is used to  connect with patients for Virtual Visits (Telemedicine).  Patients are able to view lab/test results, encounter notes, upcoming appointments, etc.  Non-urgent messages can be sent to your provider as well.   To learn more about what you can do with MyChart, go to [email or web address].    Your next appointment:   6-8  week(s) after testing  Provider:   You may see Kate Sable, MD or one of the following Advanced Practice  Providers on your designated Care Team:   Murray Hodgkins, NP Christell Faith, PA-C Cadence Kathlen Mody, PA-C Gerrie Nordmann, NP

## 2022-05-21 NOTE — Progress Notes (Signed)
Cardiology Office Note:    Date:  05/21/2022   ID:  Kurt Mendoza, DOB 08-10-1960, MRN [id number]  PCP:  Olin Hauser, [place] Prov[facility] Cardiologist:  Kate Sable, MD     Referring MD: Nobie Putnam *   Chief Complaint  Patient presents with   New Patient (Initial Visit)    Patient reports occasional chest tightness for the past year. Denies SOB. He states the symptoms do not last long when present.    Kurt Mendoza is a 62 y.o. male who is being seen today for the evaluation of chest pain at the request of Nobie Putnam *.   History of Present Illness:    Kurt Mendoza is a 62 y.o. male with a hx of hypertension, occasional cigar smoker presenting with chest pain.  States having symptoms of chest discomfort ongoing over the past 6 months or so.  Describes discomfort as chest pressure, not associated with exertion, occurring randomly lasting for few seconds.  Denies any personal or family history of heart attacks.  Takes blood pressure medication as prescribed, BP well-controlled at home with systolics in the [id number] usually.  Denies shortness of breath, very active at home.  Past Medical History:  Diagnosis Date   Esophagitis    Gout    History of irregular heartbeat    Hypertension     Past Surgical History:  Procedure Laterality Date   BACK SURGERY  1997   COLONOSCOPY WITH PROPOFOL N/A 11/09/2021   Procedure: COLONOSCOPY WITH PROPOFOL;  Surgeon: Jonathon Bellows, MD;  Location: [facility] ENDOSCOPY;  Service: Gastroenterology;  Laterality: N/A;   KNEE SURGERY  2014   LAPAROSCOPIC APPENDECTOMY N/A 02/02/2018   Procedure: APPENDECTOMY LAPAROSCOPIC;  Surgeon: Herbert Pun, MD;  Location: [facility] ORS;  Service: General;  Laterality: N/A;    Current Medications: Current Meds  Medication Sig   allopurinol (ZYLOPRIM) 300 MG tablet TAKE 1 AND 1/2 TABLETS(450 MG) BY MOUTH DAILY   losartan (COZAAR) 50 MG tablet Take 1 tablet (50  mg total) by mouth daily.   metoprolol tartrate (LOPRESSOR) 100 MG tablet Take 1 tablet (100 mg total) by mouth once for 1 dose. 2 hours before CT.   tiZANidine (ZANAFLEX) 4 MG tablet Take 0.5-1 tablets (2-4 mg total) by mouth every 8 (eight) hours as needed for muscle spasms.     Allergies:   Amlodipine and Morphine and related   Social History   Socioeconomic History   Marital status: Married    Spouse name: Lattie Haw   Number of children: Not on file   Years of education: High School   Highest education level: High school graduate  Occupational History   Not on file  Tobacco Use   Smoking status: Some Days    Types: Cigars   Smokeless tobacco: Never  Vaping Use   Vaping Use: Never used  Substance and Sexual Activity   Alcohol use: Yes    Alcohol/week: 3.0 standard drinks of alcohol    Types: 3 Standard drinks or equivalent per week    Comment: weekend   Drug use: No   Sexual activity: Not on file  Other Topics Concern   Not on file  Social History Narrative   Not on file   Social Determinants of Health   Financial Resource Strain: Not on file  Food Insecurity: Not on file  Transportation Needs: Not on file  Physical Activity: Not on file  Stress: Not on file  Social Connections: Not on file  Family History: The patient's family history includes Diabetes in his father; Heart disease in his father; Hyperlipidemia in his father and mother; Stroke in his father; Thyroid disease in his brother and sister.  ROS:   Please see the history of present illness.     All other systems reviewed and are negative.  EKGs/Labs/Other Studies Reviewed:    The following studies were reviewed today:   EKG:  EKG not ordered today.    EKG: Obtained from PCP office date 05/20/2022 reviewed.  Shows normal sinus rhythm, heart rate 62.  Recent Labs: 09/24/2021: ALT 43; BUN 18; Creat 0.66; Hemoglobin 16.5; Platelets 197; Potassium 4.0; Sodium 139; TSH 1.56  Recent Lipid Panel     Component Value Date/Time   CHOL 183 09/24/2021 0833   TRIG 89 09/24/2021 0833   HDL 46 09/24/2021 0833   CHOLHDL 4.0 09/24/2021 0833   LDLCALC 118 (H) 09/24/2021 0833     Risk Assessment/Calculations:     HYPERTENSION CONTROL Vitals:   05/21/22 0831 05/21/22 0832  BP: (!) 152/88 (!) 152/86    The patient's blood pressure is elevated above target today.  In order to address the patient's elevated BP: Blood pressure will be monitored at home to determine if medication changes need to be made.        Physical Exam:    VS:  BP (!) 152/86 (BP Location: Right Arm, Patient Position: Sitting, Cuff Size: Normal)   Pulse 72   Ht 6\' 2"  (1.88 m)   Wt 239 lb (108.4 kg)   SpO2 98%   BMI 30.69 kg/m     Wt Readings from Last 3 Encounters:  05/21/22 239 lb (108.4 kg)  05/20/22 236 lb (107 kg)  11/09/21 220 lb (99.8 kg)     GEN:  Well nourished, well developed in no acute distress HEENT: Normal NECK: No JVD; No carotid bruits CARDIAC: RRR, no murmurs, rubs, gallops RESPIRATORY:  Clear to auscultation without rales, wheezing or rhonchi  ABDOMEN: Soft, non-tender, non-distended MUSCULOSKELETAL:  No edema; No deformity  SKIN: Warm and dry NEUROLOGIC:  Alert and oriented x 3 PSYCHIATRIC:  Normal affect   ASSESSMENT:    1. Precordial pain   2. Primary hypertension   3. Chest pain, unspecified type    PLAN:    In order of problems listed above:  Chest pain, risk factors hypertension.  Get echocardiogram, get coronary CTA. Hypertension, BP elevated, usually controlled.  Continue losartan 50 mg daily.  Follow-up after echo and coronary CT.     Medication Adjustments/Labs and Tests Ordered: Current medicines are reviewed at length with the patient today.  Concerns regarding medicines are outlined above.  Orders Placed This Encounter  Procedures   CT CORONARY MORPH W/CTA COR W/SCORE W/CA W/CM &/OR WO/CM   Basic metabolic panel   ECHOCARDIOGRAM COMPLETE   Meds ordered  this encounter  Medications   metoprolol tartrate (LOPRESSOR) 100 MG tablet    Sig: Take 1 tablet (100 mg total) by mouth once for 1 dose. 2 hours before CT.    Dispense:  1 tablet    Refill:  0    Patient Instructions  Medication Instructions:  Your physician recommends that you continue on your current medications as directed. Please refer to the Current Medication list given to you today.  *If you need a refill on your cardiac medications before your next appointment, please call your pharmacy*   Lab Work:  Your physician recommends that you return to have lab work done  at [facility] on 06/04/2022 no later than 06/07/2022.  Monday-Friday   7:00 am- 6:00 pm  BMP  If you have labs (blood work) drawn today and your tests are completely normal, you will receive your results only by: [place] (if you have MyChart) OR A paper copy in the mail If you have any lab test that is abnormal or we need to change your treatment, we will call you to review the results.   Testing/Procedures:    Your cardiac CT will be scheduled at the location below on Thursday, June 13, 2022 @ 2:45 pm.   [facility] [address] [place], [place] [address] [phone]    If scheduled at [facility] , please arrive 15 mins early for check-in and test prep.   Please follow these instructions carefully (unless otherwise directed):  Hold all erectile dysfunction medications at least 3 days (72 hrs) prior to test. (Ie viagra, cialis, sildenafil, tadalafil, etc) We will administer nitroglycerin during this exam.   On the Night Before the Test: Be sure to Drink plenty of water. Do not consume any caffeinated/decaffeinated beverages or chocolate 12 hours prior to your test. Do not take any antihistamines 12 hours prior to your test.   On the Day of the Test: Drink plenty of water until 1 hour prior to the  test. Do not eat any food 1 hour prior to test. You may take your regular medications prior to the test.  Take metoprolol (Lopressor) two hours prior to test.        After the Test: Drink plenty of water. After receiving IV contrast, you may experience a mild flushed feeling. This is normal. On occasion, you may experience a mild rash up to 24 hours after the test. This is not dangerous. If this occurs, you can take Benadryl 25 mg and increase your fluid intake. If you experience trouble breathing, this can be serious. If it is severe call 911 IMMEDIATELY. If it is mild, please call our office.    Your physician has requested that you have an echocardiogram. Echocardiography is a painless test that uses sound waves to create images of your heart. It provides your doctor with information about the size and shape of your heart and how well your heart's chambers and valves are working. This procedure takes approximately one hour. There are no restrictions for this procedure. Please do NOT wear cologne, perfume, aftershave, or lotions (deodorant is allowed). Please arrive 15 minutes prior to your appointment time.     Follow-Up: At [facility], you and your health needs are our priority.  As part of our continuing mission to provide you with exceptional heart care, we have created designated Provider Care Teams.  These Care Teams include your primary Cardiologist (physician) and Advanced Practice Providers (APPs -  Physician Assistants and Nurse Practitioners) who all work together to provide you with the care you need, when you need it.  We recommend signing up for the patient portal called "MyChart".  Sign up information is provided on this After Visit Summary.  MyChart is used to connect with patients for Virtual Visits (Telemedicine).  Patients are able to view lab/test results, encounter notes, upcoming appointments, etc.  Non-urgent messages can be sent to your provider as well.    To learn more about what you can do with MyChart, go to [email or web address].    Your next appointment:   6-8  week(s) after testing  Provider:   You may see Kate Sable, MD or one of the following Advanced Practice Providers on your designated Care Team:   Murray Hodgkins, NP Christell Faith, PA-C Cadence Kathlen Mody, PA-C Gerrie Nordmann, NP   Signed, Kate Sable, MD  05/21/2022 9:22 AM    [place]

## 2022-06-06 ENCOUNTER — Other Ambulatory Visit
Admission: RE | Admit: 2022-06-06 | Discharge: 2022-06-06 | Disposition: A | Discharge status: Home / Self Care | Payer: BC Managed Care – PPO | Attending: Cardiology | Admitting: Cardiology

## 2022-06-06 DIAGNOSIS — I10 Essential (primary) hypertension: Secondary | ICD-10-CM | POA: Diagnosis not present

## 2022-06-06 DIAGNOSIS — R079 Chest pain, unspecified: Secondary | ICD-10-CM | POA: Diagnosis not present

## 2022-06-06 DIAGNOSIS — R072 Precordial pain: Secondary | ICD-10-CM | POA: Diagnosis not present

## 2022-06-06 LAB — BASIC METABOLIC PANEL
Anion gap: 9 (ref 5–15)
BUN: 22 mg/dL (ref 8–23)
CO2: 24 mmol/L (ref 22–32)
Calcium: 9 mg/dL (ref 8.9–10.3)
Chloride: 104 mmol/L (ref 98–111)
Creatinine, Ser: 0.74 mg/dL (ref 0.61–1.24)
GFR, Estimated: >60 mL/min (ref >60)
Glucose, Bld: 97 mg/dL (ref 70–99)
Potassium: 3.9 mmol/L (ref 3.5–5.1)
Sodium: 137 mmol/L (ref 135–145)

## 2022-06-12 ENCOUNTER — Telehealth (HOSPITAL_COMMUNITY): Payer: Self-pay | Admitting: *Deleted

## 2022-06-12 NOTE — Telephone Encounter (Signed)
Attempted to call patient regarding upcoming cardiac CT appointment. °Left message on voicemail with name and callback number ° °[place] RN Navigator Cardiac Imaging °[facility] °[phone] Office °[phone] Cell ° °

## 2022-06-13 ENCOUNTER — Ambulatory Visit
Admission: RE | Admit: 2022-06-13 | Discharge: 2022-06-13 | Disposition: A | Discharge status: Home / Self Care | Payer: BC Managed Care – PPO | Source: Ambulatory Visit | Attending: Cardiology | Admitting: Cardiology

## 2022-06-13 DIAGNOSIS — R079 Chest pain, unspecified: Secondary | ICD-10-CM | POA: Diagnosis not present

## 2022-06-13 DIAGNOSIS — R072 Precordial pain: Secondary | ICD-10-CM | POA: Diagnosis not present

## 2022-06-13 MED ORDER — NITROGLYCERIN 0.4 MG SL SUBL
0.8000 mg | SUBLINGUAL_TABLET | Freq: Once | SUBLINGUAL | Status: AC
  Administered 2022-06-13: 0.8 mg via SUBLINGUAL

## 2022-06-13 MED ORDER — IOHEXOL 350 MG/ML SOLN
100.0000 mL | Freq: Once | INTRAVENOUS | Status: AC | PRN
  Administered 2022-06-13: 100 mL via INTRAVENOUS

## 2022-06-13 NOTE — Progress Notes (Signed)
Patient tolerated procedure well. Ambulate w/o difficulty. Denies any lightheadedness or being dizzy. Pt denies any pain at this time. Sitting in chair, pt is encouraged to drink additional water throughout the day and reason explained to patient. Patient verbalized understanding and all questions answered. ABC intact. No further needs at this time. Discharge from procedure area w/o issues.  

## 2022-06-28 ENCOUNTER — Ambulatory Visit: Payer: BC Managed Care – PPO | Attending: Cardiology

## 2022-06-28 DIAGNOSIS — R079 Chest pain, unspecified: Secondary | ICD-10-CM | POA: Diagnosis not present

## 2022-06-28 DIAGNOSIS — R072 Precordial pain: Secondary | ICD-10-CM | POA: Diagnosis not present

## 2022-06-28 LAB — ECHOCARDIOGRAM COMPLETE
AR max vel: 3.73 cm2
AV Area VTI: 3.41 cm2
AV Area mean vel: 3.44 cm2
AV Mean grad: 3 mmHg
AV Peak grad: 6.6 mmHg
Ao pk vel: 1.28 m/s
Area-P 1/2: 3.37 cm2
S' Lateral: 3.5 cm
Single Plane A4C EF: 55.3 %

## 2022-07-26 ENCOUNTER — Encounter: Payer: Self-pay | Admitting: Cardiology

## 2022-07-26 ENCOUNTER — Ambulatory Visit: Payer: BC Managed Care – PPO | Attending: Cardiology | Admitting: Cardiology

## 2022-07-26 VITALS — BP 156/88 | HR 66 | Ht 73.0 in | Wt 233.8 lb

## 2022-07-26 DIAGNOSIS — I2584 Coronary atherosclerosis due to calcified coronary lesion: Secondary | ICD-10-CM

## 2022-07-26 DIAGNOSIS — R1310 Dysphagia, unspecified: Secondary | ICD-10-CM | POA: Diagnosis not present

## 2022-07-26 DIAGNOSIS — I2510 Atherosclerotic heart disease of native coronary artery without angina pectoris: Secondary | ICD-10-CM

## 2022-07-26 DIAGNOSIS — I10 Essential (primary) hypertension: Secondary | ICD-10-CM

## 2022-07-26 NOTE — Progress Notes (Signed)
Cardiology Office Note:    Date:  07/26/2022   ID:  Kurt Mendoza, DOB November 04, 1960, MRN [id number]  PCP:  Smitty Cords, DO   [facility] Providers Cardiologist:  Debbe Odea, MD     Referring MD: Saralyn Pilar *   Chief Complaint  Patient presents with   Follow-up    Discuss test results.  Patient denies new or acute cardiac problems/concerns today.      History of Present Illness:    Kurt Mendoza is a 62 y.o. male with a hx of hypertension,  presenting with chest pain.    Previously seen with symptoms of chest pain, echocardiogram and coronary CT was obtained to evaluate any significant abnormalities.  Presents for results.  Still has occasional discomfort.  Also endorses swallowing difficulty with solid foods.  Okay drinking liquids.  Blood pressure adequately controlled at home with systolics in the 120s.  He has arthritis affecting several joints in his neck, hands and fingers.  Deviously had steroid injections to his hands, but this stopped working.    Past Medical History:  Diagnosis Date   Esophagitis    Gout    History of irregular heartbeat    Hypertension     Past Surgical History:  Procedure Laterality Date   BACK SURGERY  1997   COLONOSCOPY WITH PROPOFOL N/A 11/09/2021   Procedure: COLONOSCOPY WITH PROPOFOL;  Surgeon: Wyline Mood, MD;  Location: [facility] ENDOSCOPY;  Service: Gastroenterology;  Laterality: N/A;   KNEE SURGERY  2014   LAPAROSCOPIC APPENDECTOMY N/A 02/02/2018   Procedure: APPENDECTOMY LAPAROSCOPIC;  Surgeon: Carolan Shiver, MD;  Location: [facility] ORS;  Service: General;  Laterality: N/A;    Current Medications: Current Meds  Medication Sig   allopurinol (ZYLOPRIM) 300 MG tablet TAKE 1 AND 1/2 TABLETS(450 MG) BY MOUTH DAILY   escitalopram (LEXAPRO) 10 MG tablet Take 1 tablet (10 mg total) by mouth daily.   losartan (COZAAR) 50 MG tablet Take 1 tablet (50 mg total) by mouth daily.   tiZANidine (ZANAFLEX) 4 MG  tablet Take 0.5-1 tablets (2-4 mg total) by mouth every 8 (eight) hours as needed for muscle spasms.     Allergies:   Amlodipine and Morphine and codeine   Social History   Socioeconomic History   Marital status: Married    Spouse name: [name]   Number of children: Not on file   Years of education: High School   Highest education level: High school graduate  Occupational History   Not on file  Tobacco Use   Smoking status: Some Days    Types: Cigars   Smokeless tobacco: Never  Vaping Use   Vaping Use: Never used  Substance and Sexual Activity   Alcohol use: Yes    Alcohol/week: 3.0 standard drinks of alcohol    Types: 3 Standard drinks or equivalent per week    Comment: weekend   Drug use: No   Sexual activity: Not on file  Other Topics Concern   Not on file  Social History Narrative   Not on file   Social Determinants of Health   Financial Resource Strain: Not on file  Food Insecurity: Not on file  Transportation Needs: Not on file  Physical Activity: Not on file  Stress: Not on file  Social Connections: Not on file     Family History: The patient's family history includes Diabetes in his father; Heart disease in his father; Hyperlipidemia in his father and mother; Stroke in his father; Thyroid disease in his brother  and sister.  ROS:   Please see the history of present illness.     All other systems reviewed and are negative.  EKGs/Labs/Other Studies Reviewed:    The following studies were reviewed today:   EKG:  EKG not ordered today.     Recent Labs: 09/24/2021: ALT 43; Hemoglobin 16.5; Platelets 197; TSH 1.56 06/06/2022: BUN 22; Creatinine, Ser 0.74; Potassium 3.9; Sodium 137  Recent Lipid Panel    Component Value Date/Time   CHOL 183 09/24/2021 0833   TRIG 89 09/24/2021 0833   HDL 46 09/24/2021 0833   CHOLHDL 4.0 09/24/2021 0833   LDLCALC 118 (H) 09/24/2021 0833     Risk Assessment/Calculations:     Physical Exam:    VS:  BP (!) 156/88 (BP  Location: Left Arm, Patient Position: Sitting, Cuff Size: Large)   Pulse 66   Ht 6\' 1"  (1.854 m)   Wt 233 lb 12.8 oz (106.1 kg)   SpO2 95%   BMI 30.85 kg/m     Wt Readings from Last 3 Encounters:  07/26/22 233 lb 12.8 oz (106.1 kg)  05/21/22 239 lb (108.4 kg)  05/20/22 236 lb (107 kg)     GEN:  Well nourished, well developed in no acute distress HEENT: Normal NECK: No JVD; No carotid bruits CARDIAC: RRR, no murmurs, rubs, gallops RESPIRATORY:  Clear to auscultation without rales, wheezing or rhonchi  ABDOMEN: Soft, non-tender, non-distended MUSCULOSKELETAL:  No edema; No deformity  SKIN: Warm and dry NEUROLOGIC:  Alert and oriented x 3 PSYCHIATRIC:  Normal affect   ASSESSMENT:    1. Coronary artery calcification   2. Primary hypertension   3. Dysphagia, unspecified type     PLAN:    In order of problems listed above:  Chest pain, coronary CT minimal nonobstructive proximal left circumflex disease, calcium score of 19.8.  EF 60-65.  Repeat lipid panel with PCP.  Patient made aware of results, reassured.  Has swallowing difficulty, GI etiology also possible. Hypertension, BP elevated, controlled at home.  Continue losartan 50 mg daily. Swallowing difficulty, follow-up with PCP, consider GI referral.  Follow-up in 12 months.     Medication Adjustments/Labs and Tests Ordered: Current medicines are reviewed at length with the patient today.  Concerns regarding medicines are outlined above.  No orders of the defined types were placed in this encounter.  No orders of the defined types were placed in this encounter.   Patient Instructions  Medication Instructions:   Your physician recommends that you continue on your current medications as directed. Please refer to the Current Medication list given to you today.  *If you need a refill on your cardiac medications before your next appointment, please call your pharmacy*   Lab Work:  None Ordered  If you have labs  (blood work) drawn today and your tests are completely normal, you will receive your results only by: MyChart Message (if you have MyChart) OR A paper copy in the mail If you have any lab test that is abnormal or we need to change your treatment, we will call you to review the results.   Testing/Procedures:  None Ordered   Follow-Up: At [facility], you and your health needs are our priority.  As part of our continuing mission to provide you with exceptional heart care, we have created designated Provider Care Teams.  These Care Teams include your primary Cardiologist (physician) and Advanced Practice Providers (APPs -  Physician Assistants and Nurse Practitioners) who all work together to  provide you with the care you need, when you need it.  We recommend signing up for the patient portal called "MyChart".  Sign up information is provided on this After Visit Summary.  MyChart is used to connect with patients for Virtual Visits (Telemedicine).  Patients are able to view lab/test results, encounter notes, upcoming appointments, etc.  Non-urgent messages can be sent to your provider as well.   To learn more about what you can do with MyChart, go to [email or web address].    Your next appointment:   12 month(s)  Provider:   You may see Debbe Odea, MD or one of the following Advanced Practice Providers on your designated Care Team:   Nicolasa Ducking, NP Eula Listen, PA-C Cadence Fransico Michael, PA-C Charlsie Quest, NP    Signed, Debbe Odea, MD  07/26/2022 4:40 PM    [facility]

## 2022-07-26 NOTE — Patient Instructions (Signed)
Medication Instructions:   Your physician recommends that you continue on your current medications as directed. Please refer to the Current Medication list given to you today.  *If you need a refill on your cardiac medications before your next appointment, please call your pharmacy*   Lab Work:  None Ordered  If you have labs (blood work) drawn today and your tests are completely normal, you will receive your results only by: MyChart Message (if you have MyChart) OR A paper copy in the mail If you have any lab test that is abnormal or we need to change your treatment, we will call you to review the results.   Testing/Procedures:  None Ordered    Follow-Up: At [facility], you and your health needs are our priority.  As part of our continuing mission to provide you with exceptional heart care, we have created designated Provider Care Teams.  These Care Teams include your primary Cardiologist (physician) and Advanced Practice Providers (APPs -  Physician Assistants and Nurse Practitioners) who all work together to provide you with the care you need, when you need it.  We recommend signing up for the patient portal called "MyChart".  Sign up information is provided on this After Visit Summary.  MyChart is used to connect with patients for Virtual Visits (Telemedicine).  Patients are able to view lab/test results, encounter notes, upcoming appointments, etc.  Non-urgent messages can be sent to your provider as well.   To learn more about what you can do with MyChart, go to [email or web address].com.    Your next appointment:   12 month(s)  Provider:   You may see Brian Agbor-Etang, MD or one of the following Advanced Practice Providers on your designated Care Team:   Christopher Berge, NP Ryan Dunn, PA-C Cadence Furth, PA-C Sheri Hammock, NP  

## 2022-08-05 ENCOUNTER — Encounter: Payer: Self-pay | Admitting: Family Medicine

## 2022-08-05 ENCOUNTER — Ambulatory Visit
Admission: RE | Admit: 2022-08-05 | Discharge: 2022-08-05 | Disposition: A | Discharge status: Home / Self Care | Payer: BC Managed Care – PPO | Attending: Family Medicine | Admitting: Family Medicine

## 2022-08-05 ENCOUNTER — Ambulatory Visit
Admission: RE | Admit: 2022-08-05 | Discharge: 2022-08-05 | Disposition: A | Discharge status: Home / Self Care | Payer: BC Managed Care – PPO | Source: Ambulatory Visit | Attending: Family Medicine | Admitting: Family Medicine

## 2022-08-05 ENCOUNTER — Ambulatory Visit: Payer: BC Managed Care – PPO | Admitting: Family Medicine

## 2022-08-05 ENCOUNTER — Other Ambulatory Visit: Payer: Self-pay | Admitting: Family Medicine

## 2022-08-05 VITALS — BP 130/76 | HR 65 | Ht 73.0 in | Wt 236.0 lb

## 2022-08-05 DIAGNOSIS — E7800 Pure hypercholesterolemia, unspecified: Secondary | ICD-10-CM

## 2022-08-05 DIAGNOSIS — M47812 Spondylosis without myelopathy or radiculopathy, cervical region: Secondary | ICD-10-CM | POA: Insufficient documentation

## 2022-08-05 DIAGNOSIS — M180 Bilateral primary osteoarthritis of first carpometacarpal joints: Secondary | ICD-10-CM | POA: Diagnosis not present

## 2022-08-05 DIAGNOSIS — I10 Essential (primary) hypertension: Secondary | ICD-10-CM

## 2022-08-05 DIAGNOSIS — G8929 Other chronic pain: Secondary | ICD-10-CM | POA: Insufficient documentation

## 2022-08-05 DIAGNOSIS — F419 Anxiety disorder, unspecified: Secondary | ICD-10-CM

## 2022-08-05 DIAGNOSIS — Z125 Encounter for screening for malignant neoplasm of prostate: Secondary | ICD-10-CM

## 2022-08-05 DIAGNOSIS — Z0000 Encounter for general adult medical examination without abnormal findings: Secondary | ICD-10-CM

## 2022-08-05 DIAGNOSIS — M542 Cervicalgia: Secondary | ICD-10-CM | POA: Insufficient documentation

## 2022-08-05 DIAGNOSIS — R7309 Other abnormal glucose: Secondary | ICD-10-CM

## 2022-08-05 DIAGNOSIS — I700 Atherosclerosis of aorta: Secondary | ICD-10-CM

## 2022-08-05 DIAGNOSIS — M1A00X0 Idiopathic chronic gout, unspecified site, without tophus (tophi): Secondary | ICD-10-CM

## 2022-08-05 MED ORDER — ESCITALOPRAM OXALATE 10 MG PO TABS: 10.0000 mg | ORAL_TABLET | Freq: Every day | ORAL | 3 refills | Status: DC

## 2022-08-05 MED ORDER — TIZANIDINE HCL 4 MG PO TABS: 2.0000 mg | ORAL_TABLET | Freq: Three times a day (TID) | ORAL | 3 refills | Status: DC | PRN

## 2022-08-05 NOTE — Progress Notes (Signed)
Subjective:    Patient ID: Kurt Mendoza, male    DOB: March 31, 1960, 62 y.o.   MRN: [id number]  Kurt Mendoza is a 62 y.o. male presenting on 08/05/2022 for Medical Management of Chronic Issues   HPI  Osteoarthritis, multiple joints Chronic Neck Pain Hand pain/stiffness Previously followed by Orthopedics Reports still difficulty with neck pain and stiffness some days worse than others. He has come off multiple medications prefers to not take gabapentin and other meds any more. Takes Tizanidine 1 tab nightly with good results, needs re order.   CHRONIC HTN: Doing well home BP checks SBP 130s avg Current Meds - Losartan 50mg  daily Reports good compliance, took meds today. Tolerating well, w/o complaints. Denies CP, dyspnea, HA, edema, dizziness / lightheadedness   Aortic Atherosclerosis On prior imaging.   Gout, chronic Followed by Dr Allena Katz [facility] Rheumatology On Allopurinol 450mg  daily for prevention, taking 1.5 tab No further flare ups of gout since that time.   Osteoarthritis multiple sites      08/05/2022    3:04 PM 05/20/2022    4:05 PM 09/24/2021    8:44 AM  Depression screen PHQ 2/9  Decreased Interest 3 1 0  Down, Depressed, Hopeless 0 1 0  PHQ - 2 Score 3 2 0  Altered sleeping 0 1 0  Tired, decreased energy 0 2 0  Change in appetite 0 0 0  Feeling bad or failure about yourself  0 0 0  Trouble concentrating 0 0 0  Moving slowly or fidgety/restless 0 0 0  Suicidal thoughts 0 0 0  PHQ-9 Score 3 5 0  Difficult doing work/chores  Not difficult at all Not difficult at all    Social History   Tobacco Use   Smoking status: Some Days    Types: Cigars   Smokeless tobacco: Never  Vaping Use   Vaping Use: Never used  Substance Use Topics   Alcohol use: Yes    Alcohol/week: 3.0 standard drinks of alcohol    Types: 3 Standard drinks or equivalent per week    Comment: weekend   Drug use: No    Review of Systems Per HPI unless specifically indicated above      Objective:    BP 130/76   Pulse 65   Ht 6\' 1"  (1.854 m)   Wt 236 lb (107 kg)   SpO2 96%   BMI 31.14 kg/m   Wt Readings from Last 3 Encounters:  08/05/22 236 lb (107 kg)  07/26/22 233 lb 12.8 oz (106.1 kg)  05/21/22 239 lb (108.4 kg)    Physical Exam Vitals and nursing note reviewed.  Constitutional:      General: He is not in acute distress.    Appearance: He is well-developed. He is not diaphoretic.     Comments: Well-appearing, comfortable, cooperative  HENT:     Head: Normocephalic and atraumatic.  Eyes:     General:        Right eye: No discharge.        Left eye: No discharge.     Conjunctiva/sclera: Conjunctivae normal.  Neck:     Thyroid: No thyromegaly.  Cardiovascular:     Rate and Rhythm: Normal rate and regular rhythm.     Pulses: Normal pulses.     Heart sounds: Normal heart sounds. No murmur heard. Pulmonary:     Effort: Pulmonary effort is normal. No respiratory distress.     Breath sounds: Normal breath sounds. No wheezing or rales.  Musculoskeletal:  General: Normal range of motion.     Cervical back: Normal range of motion and neck supple.  Lymphadenopathy:     Cervical: No cervical adenopathy.  Skin:    General: Skin is warm and dry.     Findings: No erythema or rash.  Neurological:     Mental Status: He is alert and oriented to person, place, and time. Mental status is at baseline.  Psychiatric:        Behavior: Behavior normal.     Comments: Well groomed, good eye contact, normal speech and thoughts    Results for orders placed or performed in visit on 06/28/22  ECHOCARDIOGRAM COMPLETE  Result Value Ref Range   AR max vel 3.73 cm2   AV Peak grad 6.6 mmHg   Ao pk vel 1.28 m/s   S' Lateral 3.50 cm   Area-P 1/2 3.37 cm2   AV Area VTI 3.41 cm2   AV Mean grad 3.0 mmHg   Single Plane A4C EF 55.3 %   AV Area mean vel 3.44 cm2   Est EF 60 - 65%       Assessment & Plan:   Problem List Items Addressed This Visit     Aortic  atherosclerosis (HCC)   Chronic neck pain - Primary   Relevant Medications   tiZANidine (ZANAFLEX) 4 MG tablet   escitalopram (LEXAPRO) 10 MG tablet   Other Relevant Orders   DG Cervical Spine Complete   Primary osteoarthritis of both first carpometacarpal joints   Other Visit Diagnoses     Anxiety       Relevant Medications   escitalopram (LEXAPRO) 10 MG tablet   Spondylosis of cervical region without myelopathy or radiculopathy       Relevant Medications   tiZANidine (ZANAFLEX) 4 MG tablet   Other Relevant Orders   DG Cervical Spine Complete      Anxiety Controlled on Escitalopram 10mg  dailiy, re order   Cervical Spine DDD / Radiculopathy Arthritis Last imaging >3 years ago X-ray today Most likely same problem as prior, but we will need to investigate with Ortho / Spinal specialist in [place], let me know which location and I can refer after the X-ray is resulted  Medications re ordered Tizanidine and Escitalopram.  Orders Placed This Encounter  Procedures   DG Cervical Spine Complete    Standing Status:   Future    Number of Occurrences:   1    Standing Expiration Date:   08/05/2023    Order Specific Question:   Reason for Exam (SYMPTOM  OR DIAGNOSIS REQUIRED)    Answer:   chronic neck pain cervical radiculopathy, known osteoarthritis DDD    Order Specific Question:   Preferred imaging location?    Answer:   [facility]     Meds ordered this encounter  Medications   tiZANidine (ZANAFLEX) 4 MG tablet    Sig: Take 0.5-1 tablets (2-4 mg total) by mouth every 8 (eight) hours as needed for muscle spasms.    Dispense:  90 tablet    Refill:  3   escitalopram (LEXAPRO) 10 MG tablet    Sig: Take 1 tablet (10 mg total) by mouth daily.    Dispense:  90 tablet    Refill:  3     Follow up plan: Return in about 6 weeks (around 09/16/2022) for 6-8 weeks for fasting lab only then 1 week later Annual Physical.  Future labs ordered for 09/2022  Kurt Pilar,  DO [facility] 08/05/2022, 3:32 PM

## 2022-08-05 NOTE — Patient Instructions (Addendum)
Thank you for coming to the office today.  X-ray today Most likely same problem as prior, but we will need to investigate with Ortho / Spinal specialist in [place], let me know which location and I can refer after the X-ray is resulted  Medications re ordered Tizanidine and Escitalopram.  DUE for FASTING BLOOD WORK (no food or drink after midnight before the lab appointment, only water or coffee without cream/sugar on the morning of)  SCHEDULE "Lab Only" visit in the morning at the clinic for lab draw in 6-8 WEEKS   - Make sure Lab Only appointment is at about 1 week before your next appointment, so that results will be available  For Lab Results, once available within 2-3 days of blood draw, you can can log in to MyChart online to view your results and a brief explanation. Also, we can discuss results at next follow-up visit.    Please schedule a Follow-up Appointment to: Return in about 6 weeks (around 09/16/2022) for 6-8 weeks for fasting lab only then 1 week later Annual Physical.  If you have any other questions or concerns, please feel free to call the office or send a message through MyChart. You may also schedule an earlier appointment if necessary.  Additionally, you may be receiving a survey about your experience at our office within a few days to 1 week by e-mail or mail. We value your feedback.  Saralyn Pilar, DO [facility], [place]

## 2022-08-23 ENCOUNTER — Telehealth: Payer: Self-pay

## 2022-08-23 NOTE — Telephone Encounter (Signed)
Copied from CRM [id number]. Topic: General - Other >> Aug 23, 2022  2:14 PM Dominique A wrote: Reason for CRM: Pt states that he had some x rays done and is requesting his PCP call him back to go over his x rays with him. Please have pt PCP call him back.

## 2022-08-23 NOTE — Telephone Encounter (Signed)
LMTCB 08/23/2022.  It looks like we have been trying to get in contact with Mr. Mews about his x-ray results.  PEC please advise him of the results below when he calls back.       Please notify patient that X-ray results released to MyChart with comments for patient and he can send [place] info on mychart or call back with the location he prefers for Ortho vs Spine Referral.   ------------   Neck Cervical spine shows multi level arthritis degenerative wear and tear. Most advanced at C5-C6 and C6-C7.   As we discussed in office, recommendation would be to pursue return to Orthopedic or Spine Specialist.   Which location do you prefer? Let us know and I can submit referral.   Saralyn Pilar, DO [facility] 08/15/2022, 3:56 PM  Thanks,   -Vernona Rieger

## 2022-09-16 ENCOUNTER — Other Ambulatory Visit: Payer: BC Managed Care – PPO

## 2022-09-16 ENCOUNTER — Encounter: Payer: Self-pay | Admitting: Cardiology

## 2022-09-17 ENCOUNTER — Ambulatory Visit: Payer: BC Managed Care – PPO | Admitting: Family Medicine

## 2022-10-04 ENCOUNTER — Other Ambulatory Visit: Payer: BC Managed Care – PPO

## 2022-10-04 DIAGNOSIS — M180 Bilateral primary osteoarthritis of first carpometacarpal joints: Secondary | ICD-10-CM

## 2022-10-04 DIAGNOSIS — I10 Essential (primary) hypertension: Secondary | ICD-10-CM | POA: Diagnosis not present

## 2022-10-04 DIAGNOSIS — Z0000 Encounter for general adult medical examination without abnormal findings: Secondary | ICD-10-CM

## 2022-10-04 DIAGNOSIS — E7800 Pure hypercholesterolemia, unspecified: Secondary | ICD-10-CM | POA: Diagnosis not present

## 2022-10-04 DIAGNOSIS — Z125 Encounter for screening for malignant neoplasm of prostate: Secondary | ICD-10-CM | POA: Diagnosis not present

## 2022-10-04 DIAGNOSIS — M1A00X0 Idiopathic chronic gout, unspecified site, without tophus (tophi): Secondary | ICD-10-CM

## 2022-10-04 DIAGNOSIS — R7309 Other abnormal glucose: Secondary | ICD-10-CM

## 2022-10-05 LAB — COMPLETE METABOLIC PANEL WITH GFR
AG Ratio: 1.6 (calc) (ref 1.0–2.5)
ALT: 31 U/L (ref 9–46)
AST: 22 U/L (ref 10–35)
Albumin: 4.6 g/dL (ref 3.6–5.1)
Alkaline phosphatase (APISO): 66 U/L (ref 35–144)
BUN/Creatinine Ratio: 22 (calc) (ref 6–22)
BUN: 15 mg/dL (ref 7–25)
CO2: 28 mmol/L (ref 20–32)
Calcium: 9.2 mg/dL (ref 8.6–10.3)
Chloride: 102 mmol/L (ref 98–110)
Creat: 0.69 mg/dL — ABNORMAL LOW (ref 0.70–1.35)
Globulin: 2.9 g/dL (ref 1.9–3.7)
Glucose, Bld: 96 mg/dL (ref 65–99)
Potassium: 3.9 mmol/L (ref 3.5–5.3)
Sodium: 139 mmol/L (ref 135–146)
Total Bilirubin: 0.5 mg/dL (ref 0.2–1.2)
Total Protein: 7.5 g/dL (ref 6.1–8.1)
eGFR: 104 mL/min/{1.73_m2} (ref >=60)

## 2022-10-05 LAB — LIPID PANEL
Cholesterol: 182 mg/dL (ref <200)
HDL: 45 mg/dL (ref >=40)
LDL Cholesterol (Calc): 117 mg/dL — ABNORMAL HIGH
Non-HDL Cholesterol (Calc): 137 mg/dL — ABNORMAL HIGH (ref <130)
Total CHOL/HDL Ratio: 4 (calc) (ref <5.0)
Triglycerides: 97 mg/dL (ref <150)

## 2022-10-05 LAB — CBC WITH DIFFERENTIAL/PLATELET
Absolute Monocytes: 504 {cells}/uL (ref 200–950)
Basophils Absolute: 39 {cells}/uL (ref 0–200)
Basophils Relative: 0.7 %
Eosinophils Absolute: 62 {cells}/uL (ref 15–500)
Eosinophils Relative: 1.1 %
HCT: 46.3 % (ref 38.5–50.0)
Hemoglobin: 15.7 g/dL (ref 13.2–17.1)
Lymphs Abs: 2100 {cells}/uL (ref 850–3900)
MCH: 31.5 pg (ref 27.0–33.0)
MCHC: 33.9 g/dL (ref 32.0–36.0)
MCV: 92.8 fL (ref 80.0–100.0)
MPV: 11.1 fL (ref 7.5–12.5)
Monocytes Relative: 9 %
Neutro Abs: 2895 {cells}/uL (ref 1500–7800)
Neutrophils Relative %: 51.7 %
Platelets: 195 10*3/uL (ref 140–400)
RBC: 4.99 10*6/uL (ref 4.20–5.80)
RDW: 12.6 % (ref 11.0–15.0)
Total Lymphocyte: 37.5 %
WBC: 5.6 10*3/uL (ref 3.8–10.8)

## 2022-10-05 LAB — HEMOGLOBIN A1C
Hgb A1c MFr Bld: 5.2 %{Hb} (ref <5.7)
Mean Plasma Glucose: 103 mg/dL
eAG (mmol/L): 5.7 mmol/L

## 2022-10-05 LAB — PSA: PSA: 0.78 ng/mL (ref <=4.00)

## 2022-10-05 LAB — URIC ACID: Uric Acid, Serum: 4.3 mg/dL (ref 4.0–8.0)

## 2022-10-05 LAB — TSH: TSH: 1.96 m[IU]/L (ref 0.40–4.50)

## 2022-10-11 ENCOUNTER — Ambulatory Visit (INDEPENDENT_AMBULATORY_CARE_PROVIDER_SITE_OTHER): Payer: BC Managed Care – PPO | Admitting: Family Medicine

## 2022-10-11 ENCOUNTER — Encounter: Payer: Self-pay | Admitting: Family Medicine

## 2022-10-11 VITALS — BP 138/67 | HR 67 | Ht 73.0 in | Wt 241.0 lb

## 2022-10-11 DIAGNOSIS — N522 Drug-induced erectile dysfunction: Secondary | ICD-10-CM

## 2022-10-11 DIAGNOSIS — I10 Essential (primary) hypertension: Secondary | ICD-10-CM

## 2022-10-11 DIAGNOSIS — F419 Anxiety disorder, unspecified: Secondary | ICD-10-CM | POA: Diagnosis not present

## 2022-10-11 DIAGNOSIS — Z0000 Encounter for general adult medical examination without abnormal findings: Secondary | ICD-10-CM | POA: Diagnosis not present

## 2022-10-11 DIAGNOSIS — I700 Atherosclerosis of aorta: Secondary | ICD-10-CM

## 2022-10-11 DIAGNOSIS — M1A00X0 Idiopathic chronic gout, unspecified site, without tophus (tophi): Secondary | ICD-10-CM

## 2022-10-11 MED ORDER — ALLOPURINOL 300 MG PO TABS
ORAL_TABLET | ORAL | 3 refills | Status: DC
Start: 2022-10-11 — End: 2023-10-24

## 2022-10-11 MED ORDER — LOSARTAN POTASSIUM 50 MG PO TABS: 50.0000 mg | ORAL_TABLET | Freq: Every day | ORAL | 3 refills | Status: DC

## 2022-10-11 MED ORDER — SILDENAFIL CITRATE 20 MG PO TABS
ORAL_TABLET | ORAL | 3 refills | Status: DC
Start: 2022-10-11 — End: 2023-12-26

## 2022-10-11 NOTE — Assessment & Plan Note (Signed)
Well-controlled HTN No known complications    Plan:  1. Continue current BP regimen Losartan '50mg'$  daily 2. Encourage improved lifestyle - low sodium diet, regular exercise 3. Continue monitor BP outside office, bring readings to next visit, if persistently >140/90 or new symptoms notify office sooner

## 2022-10-11 NOTE — Progress Notes (Signed)
Subjective:    Patient ID: Kurt Mendoza, male    DOB: [birth year], 62 y.o.   MRN: [id number]  Kurt Mendoza is a 62 y.o. male presenting on 10/11/2022 for Annual Exam   HPI  Here for Annual Physical and Lab Review  Osteoarthritis, multiple joints Chronic Neck Pain Hand pain/stiffness Previously followed by Orthopedics Takes Tizanidine 1 tab nightly with good results   CHRONIC HTN: Doing well home BP checks Current Meds - Losartan 50mg  daily Reports good compliance, took meds today. Tolerating well, w/o complaints. Denies CP, dyspnea, HA, edema, dizziness / lightheadedness   Aortic Atherosclerosis On prior imaging. Not on statin   Gout, chronic Followed by Dr Allena Katz [facility] Rheumatology Updated Uric Acid level 4.3 On Allopurinol 450mg  daily for prevention, taking 1.5 tab No further flare ups of gout since that time.   Osteoarthritis multiple sites  A1c 5.2  HYPERLIPIDEMIA: - Reports no concerns. Last lipid panel LDL 117, controlled lipids Not on statin. On ASA 81 Has had Cardiology evaluation CT Morphology  Anxiety vs panic  He reports episode 1-2 x per week now, with feeling "off" and no particular trigger he can attribute to but feels maybe anxiety or blood pressure causing. He describes episode of chest tightness in the past. He has difficulty relaxing and feels hands and neck hurting Improved on SSRI Lexapro Admits some erectile dysfunction side effect.    Health Maintenance:  PSA 0.78  Colonoscopy last 11/09/21, next due 3 years, repeat 2026      10/11/2022    8:44 AM 08/05/2022    3:04 PM 05/20/2022    4:05 PM  Depression screen PHQ 2/9  Decreased Interest 0 3 1  Down, Depressed, Hopeless 0 0 1  PHQ - 2 Score 0 3 2  Altered sleeping 0 0 1  Tired, decreased energy 0 0 2  Change in appetite 0 0 0  Feeling bad or failure about yourself  0 0 0  Trouble concentrating 0 0 0  Moving slowly or fidgety/restless 0 0 0  Suicidal thoughts 0 0 0  PHQ-9 Score 0  3 5  Difficult doing work/chores Not difficult at all  Not difficult at all    Past Medical History:  Diagnosis Date   Esophagitis    Gout    History of irregular heartbeat    Hypertension    Past Surgical History:  Procedure Laterality Date   BACK SURGERY  1997   COLONOSCOPY WITH PROPOFOL N/A 11/09/2021   Procedure: COLONOSCOPY WITH PROPOFOL;  Surgeon: Wyline Mood, MD;  Location: [facility] ENDOSCOPY;  Service: Gastroenterology;  Laterality: N/A;   KNEE SURGERY  2014   LAPAROSCOPIC APPENDECTOMY N/A 02/02/2018   Procedure: APPENDECTOMY LAPAROSCOPIC;  Surgeon: Carolan Shiver, MD;  Location: [facility] ORS;  Service: General;  Laterality: N/A;   Social History   Socioeconomic History   Marital status: Married    Spouse name: [name]   Number of children: Not on file   Years of education: High School   Highest education level: High school graduate  Occupational History   Not on file  Tobacco Use   Smoking status: Some Days    Types: Cigars   Smokeless tobacco: Never  Vaping Use   Vaping status: Never Used  Substance and Sexual Activity   Alcohol use: Yes    Alcohol/week: 3.0 standard drinks of alcohol    Types: 3 Standard drinks or equivalent per week    Comment: weekend   Drug use: No   Sexual activity:  Not on file  Other Topics Concern   Not on file  Social History Narrative   Not on file   Social Determinants of Health   Financial Resource Strain: Not on file  Food Insecurity: Not on file  Transportation Needs: Not on file  Physical Activity: Not on file  Stress: Not on file  Social Connections: Not on file  Intimate Partner Violence: Not on file   Family History  Problem Relation Age of Onset   Hyperlipidemia Mother    Stroke Father    Hyperlipidemia Father    Diabetes Father    Heart disease Father    Thyroid disease Sister    Thyroid disease Brother    Current Outpatient Medications on File Prior to Visit  Medication Sig   escitalopram (LEXAPRO) 10 MG  tablet Take 1 tablet (10 mg total) by mouth daily.   tiZANidine (ZANAFLEX) 4 MG tablet Take 0.5-1 tablets (2-4 mg total) by mouth every 8 (eight) hours as needed for muscle spasms.   No current facility-administered medications on file prior to visit.    Review of Systems  Constitutional:  Negative for activity change, appetite change, chills, diaphoresis, fatigue and fever.  HENT:  Negative for congestion and hearing loss.   Eyes:  Negative for visual disturbance.  Respiratory:  Negative for cough, chest tightness, shortness of breath and wheezing.   Cardiovascular:  Negative for chest pain, palpitations and leg swelling.  Gastrointestinal:  Negative for abdominal pain, constipation, diarrhea, nausea and vomiting.  Genitourinary:  Negative for dysuria, frequency and hematuria.  Musculoskeletal:  Negative for arthralgias and neck pain.  Skin:  Negative for rash.  Neurological:  Negative for dizziness, weakness, light-headedness, numbness and headaches.  Hematological:  Negative for adenopathy.  Psychiatric/Behavioral:  Negative for behavioral problems, dysphoric mood and sleep disturbance.    Per HPI unless specifically indicated above      Objective:    BP 138/67   Pulse 67   Ht 6\' 1"  (1.854 m)   Wt 241 lb (109.3 kg)   SpO2 97%   BMI 31.80 kg/m   Wt Readings from Last 3 Encounters:  10/11/22 241 lb (109.3 kg)  08/05/22 236 lb (107 kg)  07/26/22 233 lb 12.8 oz (106.1 kg)    Physical Exam Vitals and nursing note reviewed.  Constitutional:      General: He is not in acute distress.    Appearance: He is well-developed. He is not diaphoretic.     Comments: Well-appearing, comfortable, cooperative  HENT:     Head: Normocephalic and atraumatic.  Eyes:     General:        Right eye: No discharge.        Left eye: No discharge.     Conjunctiva/sclera: Conjunctivae normal.     Pupils: Pupils are equal, round, and reactive to light.  Neck:     Thyroid: No thyromegaly.      Vascular: No carotid bruit.  Cardiovascular:     Rate and Rhythm: Normal rate and regular rhythm.     Pulses: Normal pulses.     Heart sounds: Normal heart sounds. No murmur heard. Pulmonary:     Effort: Pulmonary effort is normal. No respiratory distress.     Breath sounds: Normal breath sounds. No wheezing or rales.  Abdominal:     General: Bowel sounds are normal. There is no distension.     Palpations: Abdomen is soft. There is no mass.     Tenderness: There is no  abdominal tenderness.  Musculoskeletal:        General: No tenderness. Normal range of motion.     Cervical back: Normal range of motion and neck supple.     Right lower leg: No edema.     Left lower leg: No edema.     Comments: Upper / Lower Extremities: - Normal muscle tone, strength bilateral upper extremities 5/5, lower extremities 5/5  Lymphadenopathy:     Cervical: No cervical adenopathy.  Skin:    General: Skin is warm and dry.     Findings: No erythema or rash.  Neurological:     Mental Status: He is alert and oriented to person, place, and time.     Comments: Distal sensation intact to light touch all extremities  Psychiatric:        Mood and Affect: Mood normal.        Behavior: Behavior normal.        Thought Content: Thought content normal.     Comments: Well groomed, good eye contact, normal speech and thoughts       Results for orders placed or performed in visit on 10/04/22  TSH  Result Value Ref Range   TSH 1.96 0.40 - 4.50 mIU/L  PSA  Result Value Ref Range   PSA 0.78 < OR = 4.00 ng/mL  Lipid panel  Result Value Ref Range   Cholesterol 182 <200 mg/dL   HDL 45 > OR = 40 mg/dL   Triglycerides 97 <161 mg/dL   LDL Cholesterol (Calc) 117 (H) mg/dL (calc)   Total CHOL/HDL Ratio 4.0 <5.0 (calc)   Non-HDL Cholesterol (Calc) 137 (H) <130 mg/dL (calc)  Hemoglobin W9U  Result Value Ref Range   Hgb A1c MFr Bld 5.2 <5.7 % of total Hgb   Mean Plasma Glucose 103 mg/dL   eAG (mmol/L) 5.7 mmol/L   CBC with Differential/Platelet  Result Value Ref Range   WBC 5.6 3.8 - 10.8 Thousand/uL   RBC 4.99 4.20 - 5.80 Million/uL   Hemoglobin 15.7 13.2 - 17.1 g/dL   HCT 04.5 40.9 - 81.1 %   MCV 92.8 80.0 - 100.0 fL   MCH 31.5 27.0 - 33.0 pg   MCHC 33.9 32.0 - 36.0 g/dL   RDW 91.4 78.2 - 95.6 %   Platelets 195 140 - 400 Thousand/uL   MPV 11.1 7.5 - 12.5 fL   Neutro Abs 2,895 1,500 - 7,800 cells/uL   Lymphs Abs 2,100 850 - 3,900 cells/uL   Absolute Monocytes 504 200 - 950 cells/uL   Eosinophils Absolute 62 15 - 500 cells/uL   Basophils Absolute 39 0 - 200 cells/uL   Neutrophils Relative % 51.7 %   Total Lymphocyte 37.5 %   Monocytes Relative 9.0 %   Eosinophils Relative 1.1 %   Basophils Relative 0.7 %  COMPLETE METABOLIC PANEL WITH GFR  Result Value Ref Range   Glucose, Bld 96 65 - 99 mg/dL   BUN 15 7 - 25 mg/dL   Creat 2.13 (L) 0.86 - 1.35 mg/dL   eGFR 578 > OR = 60 IO/NGE/9.52W4   BUN/Creatinine Ratio 22 6 - 22 (calc)   Sodium 139 135 - 146 mmol/L   Potassium 3.9 3.5 - 5.3 mmol/L   Chloride 102 98 - 110 mmol/L   CO2 28 20 - 32 mmol/L   Calcium 9.2 8.6 - 10.3 mg/dL   Total Protein 7.5 6.1 - 8.1 g/dL   Albumin 4.6 3.6 - 5.1 g/dL   Globulin 2.9 1.9 -  3.7 g/dL (calc)   AG Ratio 1.6 1.0 - 2.5 (calc)   Total Bilirubin 0.5 0.2 - 1.2 mg/dL   Alkaline phosphatase (APISO) 66 35 - 144 U/L   AST 22 10 - 35 U/L   ALT 31 9 - 46 U/L  Uric acid  Result Value Ref Range   Uric Acid, Serum 4.3 4.0 - 8.0 mg/dL      Assessment & Plan:   Problem List Items Addressed This Visit     Anxiety    Controlled on SSRI On Escitalopram 10mg  daily, has side effect with ED, will cover w/ Sildenafil      Aortic atherosclerosis (HCC)    On prior CT imaging 02/01/18 Not on statin therapy at this time      Relevant Medications   losartan (COZAAR) 50 MG tablet   sildenafil (REVATIO) 20 MG tablet   Drug-induced erectile dysfunction     Secondary to drug side effect SSRI Escitalopram Add  Sildenafil generic, 20mg + dosing, goodrx, printed      Relevant Medications   sildenafil (REVATIO) 20 MG tablet   Essential hypertension    Well-controlled HTN No known complications    Plan:  1. Continue current BP regimen Losartan 50mg  daily 2. Encourage improved lifestyle - low sodium diet, regular exercise 3. Continue monitor BP outside office, bring readings to next visit, if persistently >140/90 or new symptoms notify office sooner      Relevant Medications   losartan (COZAAR) 50 MG tablet   sildenafil (REVATIO) 20 MG tablet   Gout    Controlled Gout Without flares On high dose Allopurinol Uric acid 4.3 Continue on Allopurinol 450mg  daily, 300mg  x 1.5 dose      Relevant Medications   allopurinol (ZYLOPRIM) 300 MG tablet   Other Visit Diagnoses     Annual physical exam    -  Primary       Updated Health Maintenance information Reviewed recent lab results with patient Encouraged improvement to lifestyle with diet and exercise Goal of weight loss    Meds ordered this encounter  Medications   losartan (COZAAR) 50 MG tablet    Sig: Take 1 tablet (50 mg total) by mouth daily.    Dispense:  90 tablet    Refill:  3    Keep future refills on file   allopurinol (ZYLOPRIM) 300 MG tablet    Sig: TAKE 1 AND 1/2 TABLETS(450 MG) BY MOUTH DAILY    Dispense:  135 tablet    Refill:  3    Add future refills   sildenafil (REVATIO) 20 MG tablet    Sig: Take 1-5 pills about 30 min prior to sex. Start with 1 and increase as needed.    Dispense:  90 tablet    Refill:  3      Follow up plan: Return in about 1 year (around 10/11/2023) for 1 year fasting lab only then 1 week later Annual Physical.  Future labs will be needed for 10/17/23  Saralyn Pilar, DO [facility] 10/11/2022, 9:01 AM

## 2022-10-11 NOTE — Patient Instructions (Addendum)
Thank you for coming to the office today.  Medicines refilled.  Keep up the great work.  Continue with the Allopurinol it is working well.  Recent Labs    10/04/22 0807  HGBA1C 5.2   Lipid Panel     Component Value Date/Time   CHOL 182 10/04/2022 0807   TRIG 97 10/04/2022 0807   HDL 45 10/04/2022 0807   CHOLHDL 4.0 10/04/2022 0807   LDLCALC 117 (H) 10/04/2022 0807   Goal to keep improving on the cholesterol in diet and lifestyle.  Start Sildenafil 20mg  - take 1 tablet about 30 min prior to sexual intercourse for improved erection. If this dose does not work or is not strong enough NEXT TIME you can increase to 2 pills for 40mg . Maximum dose is 5 pills or 100mg , most people end up taking 3-4 pills per dose and this decision is up to you based on the results.  Once you take a dose, you have to wait 24 hours to repeat a dose.  You will need to use www.goodrx.com website or app on phone to enter "Sildenafil" 20mg  medication and "Get Free Coupon" option to save for [facility] once you select pharmacy and # of pills. Show that coupon to pharmacy. Otherwise it will cost hundreds of dollars.  Follow up if not working or new concerns we can refer you to a Urologist.  DUE for FASTING BLOOD WORK (no food or drink after midnight before the lab appointment, only water or coffee without cream/sugar on the morning of)  SCHEDULE "Lab Only" visit in the morning at the clinic for lab draw in 1 YEAR  - Make sure Lab Only appointment is at about 1 week before your next appointment, so that results will be available  For Lab Results, once available within 2-3 days of blood draw, you can can log in to MyChart online to view your results and a brief explanation. Also, we can discuss results at next follow-up visit.    Please schedule a Follow-up Appointment to: Return in about 1 year (around 10/11/2023) for 1 year fasting lab only then 1 week later Annual Physical.  If you have any other  questions or concerns, please feel free to call the office or send a message through MyChart. You may also schedule an earlier appointment if necessary.  Additionally, you may be receiving a survey about your experience at our office within a few days to 1 week by e-mail or mail. We value your feedback.  Saralyn Pilar, DO [facility], [place]

## 2022-10-11 NOTE — Assessment & Plan Note (Signed)
On prior CT imaging 02/01/18 Not on statin therapy at this time 

## 2022-10-11 NOTE — Assessment & Plan Note (Signed)
Controlled on SSRI On Escitalopram 10mg  daily, has side effect with ED, will cover w/ Sildenafil

## 2022-10-11 NOTE — Assessment & Plan Note (Signed)
  Secondary to drug side effect SSRI Escitalopram Add Sildenafil generic, 20mg + dosing, goodrx, printed

## 2022-10-11 NOTE — Assessment & Plan Note (Signed)
Controlled Gout Without flares On high dose Allopurinol Uric acid 4.3 Continue on Allopurinol 450mg  daily, 300mg  x 1.5 dose

## 2022-11-05 ENCOUNTER — Ambulatory Visit: Payer: Self-pay

## 2022-11-05 ENCOUNTER — Emergency Department: Payer: BC Managed Care – PPO

## 2022-11-05 ENCOUNTER — Other Ambulatory Visit: Payer: Self-pay

## 2022-11-05 ENCOUNTER — Emergency Department
Admission: EM | Admit: 2022-11-05 | Discharge: 2022-11-05 | Disposition: A | Discharge status: Home / Self Care | Payer: BC Managed Care – PPO | Attending: Emergency Medicine | Admitting: Emergency Medicine

## 2022-11-05 DIAGNOSIS — M5136 Other intervertebral disc degeneration, lumbar region: Secondary | ICD-10-CM | POA: Diagnosis not present

## 2022-11-05 DIAGNOSIS — M546 Pain in thoracic spine: Secondary | ICD-10-CM | POA: Insufficient documentation

## 2022-11-05 DIAGNOSIS — Y92009 Unspecified place in unspecified non-institutional (private) residence as the place of occurrence of the external cause: Secondary | ICD-10-CM

## 2022-11-05 DIAGNOSIS — R0781 Pleurodynia: Secondary | ICD-10-CM | POA: Diagnosis not present

## 2022-11-05 DIAGNOSIS — S299XXA Unspecified injury of thorax, initial encounter: Secondary | ICD-10-CM | POA: Diagnosis not present

## 2022-11-05 DIAGNOSIS — I700 Atherosclerosis of aorta: Secondary | ICD-10-CM | POA: Diagnosis not present

## 2022-11-05 DIAGNOSIS — S300XXA Contusion of lower back and pelvis, initial encounter: Secondary | ICD-10-CM | POA: Diagnosis not present

## 2022-11-05 DIAGNOSIS — T148XXA Other injury of unspecified body region, initial encounter: Secondary | ICD-10-CM

## 2022-11-05 DIAGNOSIS — S39012A Strain of muscle, fascia and tendon of lower back, initial encounter: Secondary | ICD-10-CM | POA: Diagnosis not present

## 2022-11-05 DIAGNOSIS — M4185 Other forms of scoliosis, thoracolumbar region: Secondary | ICD-10-CM | POA: Diagnosis not present

## 2022-11-05 DIAGNOSIS — W108XXA Fall (on) (from) other stairs and steps, initial encounter: Secondary | ICD-10-CM | POA: Diagnosis not present

## 2022-11-05 DIAGNOSIS — I10 Essential (primary) hypertension: Secondary | ICD-10-CM | POA: Diagnosis not present

## 2022-11-05 DIAGNOSIS — M545 Low back pain, unspecified: Secondary | ICD-10-CM | POA: Diagnosis not present

## 2022-11-05 DIAGNOSIS — R918 Other nonspecific abnormal finding of lung field: Secondary | ICD-10-CM | POA: Diagnosis not present

## 2022-11-05 DIAGNOSIS — S3992XA Unspecified injury of lower back, initial encounter: Secondary | ICD-10-CM | POA: Diagnosis not present

## 2022-11-05 DIAGNOSIS — S20219A Contusion of unspecified front wall of thorax, initial encounter: Secondary | ICD-10-CM | POA: Diagnosis not present

## 2022-11-05 LAB — URINALYSIS, ROUTINE W REFLEX MICROSCOPIC
Bilirubin Urine: NEGATIVE
Glucose, UA: NEGATIVE mg/dL
Hgb urine dipstick: NEGATIVE
Ketones, ur: NEGATIVE mg/dL
Leukocytes,Ua: NEGATIVE
Nitrite: NEGATIVE
Protein, ur: NEGATIVE mg/dL
Specific Gravity, Urine: 1.027 (ref 1.005–1.030)
pH: 5 (ref 5.0–8.0)

## 2022-11-05 MED ORDER — CYCLOBENZAPRINE HCL 5 MG PO TABS: 5.0000 mg | ORAL_TABLET | Freq: Three times a day (TID) | ORAL | 0 refills | Status: DC | PRN

## 2022-11-05 MED ORDER — CYCLOBENZAPRINE HCL 10 MG PO TABS
10.0000 mg | ORAL_TABLET | Freq: Once | ORAL | Status: AC
  Administered 2022-11-05: 10 mg via ORAL
  Filled 2022-11-05: qty 1

## 2022-11-05 MED ORDER — KETOROLAC TROMETHAMINE 30 MG/ML IJ SOLN
30.0000 mg | Freq: Once | INTRAMUSCULAR | Status: AC
  Administered 2022-11-05: 30 mg via INTRAMUSCULAR
  Filled 2022-11-05: qty 1

## 2022-11-05 MED ORDER — IBUPROFEN 800 MG PO TABS: 800.0000 mg | ORAL_TABLET | Freq: Three times a day (TID) | ORAL | 0 refills | Status: AC | PRN

## 2022-11-05 NOTE — ED Provider Triage Note (Signed)
Emergency Medicine Provider Triage Evaluation Note  Kurt Mendoza , a 62 y.o. male  was evaluated in triage.  Pt complains of back pain after a fall about an hour ago. Patient took ibuprofen at home before coming to the ED. Patient's wife reports a red spot, bruising on his right buttock and thigh.  Review of Systems  Positive: Right sided rib pain, pain with deep breathing Negative: Chest pain  Physical Exam  There were no vitals taken for this visit. Gen:   Awake, no distress   Resp:  Normal effort  MSK:   Moves extremities without difficulty  Other:  TTP along lower right ribs, abrasion to the right flank  Medical Decision Making  Medically screening exam initiated at 1:50 PM.  Appropriate orders placed.  Theodora Regis was informed that the remainder of the evaluation will be completed by another provider, this initial triage assessment does not replace that evaluation, and the importance of remaining in the ED until their evaluation is complete.    Cameron Ali, PA-C 11/05/22 1401

## 2022-11-05 NOTE — ED Notes (Signed)
Water brought to Pt at bedside.

## 2022-11-05 NOTE — ED Triage Notes (Signed)
Patient states he slipped and fell down approximately 5 steps on deck; complaining of pain to right mid and low back.

## 2022-11-05 NOTE — ED Notes (Signed)
Pt came from home and fell on his home steps right on his sacrum. States it hurts lower back/sacrum where "my kidney's are." 6/10 pain at rest/sitting, 10/10 pain with activity.

## 2022-11-05 NOTE — ED Provider Notes (Signed)
[facility] Emergency Department Provider Note     Event Date/Time   First MD Initiated Contact with Patient 11/05/22 1416     (approximate)   History   Fall   HPI  Kurt Mendoza is a 62 y.o. male with a history of HTN, chronic back pain, gout, and anxiety, presents to the ED via personal vehicle.  Discomfort by spouse, who reports the patient had a mechanical fall, slipping on the back deck.  He complains of pain to the mid and low back on the right side.  He denies any head injury or LOC.     Physical Exam   Triage Vital Signs: ED Triage Vitals  Encounter Vitals Group     BP 11/05/22 1403 (!) 145/86     Systolic BP Percentile --      Diastolic BP Percentile --      Pulse Rate 11/05/22 1351 70     Resp 11/05/22 1351 20     Temp 11/05/22 1351 97.7 F (36.5 C)     Temp Source 11/05/22 1351 Oral     SpO2 11/05/22 1351 94 %     Weight 11/05/22 1353 230 lb (104.3 kg)     Height 11/05/22 1353 6\' 1"  (1.854 m)     Head Circumference --      Peak Flow --      Pain Score 11/05/22 1353 6     Pain Loc --      Pain Education --      Exclude from Growth Chart --     Most recent vital signs: Vitals:   11/05/22 1417 11/05/22 1645  BP: (!) 147/81 (!) 129/102  Pulse: 65 63  Resp: 20 20  Temp: 97.7 F (36.5 C)   SpO2: 95% 95%    General Awake, no distress. NAD HEENT NCAT. PERRL. EOMI. No rhinorrhea. Mucous membranes are moist.  CV:  Good peripheral perfusion. RRR RESP:  Normal effort. CTA.  No chest wall deformity or ecchymosis noted. ABD:  No distention.  Soft and nontender MSK:  Normal spinal alignment without midline tenderness, spasm, deformity, or step-off.  Normal active range of motion of all extremities on exam.   ED Results / Procedures / Treatments   Labs (all labs ordered are listed, but only abnormal results are displayed) Labs Reviewed  URINALYSIS, ROUTINE W REFLEX MICROSCOPIC - Abnormal; Notable for the following components:       Result Value   Color, Urine YELLOW (*)    APPearance CLEAR (*)    All other components within normal limits     EKG  RADIOLOGY  I personally viewed and evaluated these images as part of my medical decision making, as well as reviewing the written report by the radiologist.  ED Provider Interpretation: No acute findings  DG Lumbar Spine  IMPRESSION: 1. No acute findings. 2. Mild-to-moderate multilevel lumbar spine DDD, worse at L5-S1. 3. Aortic Atherosclerosis (ICD10-I70.0).  DG Right Ribs w/ CXR  IMPRESSION: 1. No acute displaced rib fracture. 2. Right lower lung opacity silhouetting the right hemidiaphragm, which may represent atelectasis or a small pleural effusion. PROCEDURES:  Critical Care performed: No  Procedures   MEDICATIONS ORDERED IN ED: Medications  ketorolac (TORADOL) 30 MG/ML injection 30 mg (30 mg Intramuscular Given 11/05/22 1606)  cyclobenzaprine (FLEXERIL) tablet 10 mg (10 mg Oral Given 11/05/22 1606)     IMPRESSION / MDM / ASSESSMENT AND PLAN / ED COURSE  I reviewed the triage vital signs  and the nursing notes.                              Differential diagnosis includes, but is not limited to, chest wall contusion, rib fractures, pneumothorax, lumbar contusion, lumbar strain, lumbar fracture, myalgias  Patient's presentation is most consistent with acute complicated illness / injury requiring diagnostic workup.  Patient's diagnosis is consistent with mechanical fall resulting in lumbar contusion and muscle strain.  Patient with reassuring exam and workup at this time.  No radiologic evidence of any acute rib fracture, pneumothorax, or lumbar fracture based on my interpretation of images.  Patient will be discharged home with prescriptions for cyclobenzaprine and ibuprofen. Patient is to follow up with primary provider as discussed, as needed or otherwise directed. Patient is given ED precautions to return to the ED for any worsening or new  symptoms.   FINAL CLINICAL IMPRESSION(S) / ED DIAGNOSES   Final diagnoses:  Fall in home, initial encounter  Lumbar contusion, initial encounter  Muscle strain     Rx / DC Orders   ED Discharge Orders          Ordered    cyclobenzaprine (FLEXERIL) 5 MG tablet  3 times daily PRN        11/05/22 1641    ibuprofen (ADVIL) 800 MG tablet  Every 8 hours PRN        11/05/22 1641             Note:  This document was prepared using Dragon voice recognition software and may include unintentional dictation errors.    Lissa Hoard, PA-C 11/06/22 2308    Merwyn Katos, MD 11/08/22 [phone]

## 2022-11-05 NOTE — Telephone Encounter (Signed)
Chief Complaint: Fall Symptoms: left buttocks bruised, lower back pain, chest pain when inhaling,  Frequency: constant onset 20-64mins ago Pertinent Negatives: Patient denies open wounds, cuts Disposition: [x] ED /[] Urgent Care (no appt availability in office) / [] Appointment(In office/virtual)/ []  [facility]/ [] Home Care/ [] Refused Recommended Disposition /[] [facility]/ []  Follow-up with PCP Additional Notes: Patient fell outside on slippery steps and hurt his lower back. Patient also states it is painful to breath in right now rating pain 8/10. Patient reports a large bruise on the left buttock and pain on the right thigh. Patient wife came home from work due to the fall. Care advice was given and advised to seek care at the ED. Wife [name] stated she would take him to the ED now. Advised to callback if they have any additional questions. [name] and patient verbalized understanding.  Reason for Disposition  Injury (or injuries) that need emergency care  Answer Assessment - Initial Assessment Questions 1. MECHANISM: "How did the fall happen?"     He fell on slippery steps outside in the rain  2. DOMESTIC VIOLENCE AND ELDER ABUSE SCREENING: "Did you fall because someone pushed you or tried to hurt you?" If Yes, ask: "Are you safe now?"     No 3. ONSET: "When did the fall happen?" (e.g., minutes, hours, or days ago)     20-30 minutes ago 4. LOCATION: "What part of the body hit the ground?" (e.g., back, buttocks, head, hips, knees, hands, head, stomach)     Lower back, right thigh and side 5. INJURY: "Did you hurt (injure) yourself when you fell?" If Yes, ask: "What did you injure? Tell me more about this?" (e.g., body area; type of injury; pain severity)"     Yes, right side is hurting and lower back  6. PAIN: "Is there any pain?" If Yes, ask: "How bad is the pain?" (e.g., Scale 1-10; or mild,  moderate, severe)   - NONE (0): No pain   - MILD (1-3): Doesn't interfere with  normal activities    - MODERATE (4-7): Interferes with normal activities or awakens from sleep    - SEVERE (8-10): Excruciating pain, unable to do any normal activities      8/10 7. SIZE: For cuts, bruises, or swelling, ask: "How large is it?" (e.g., inches or centimeters)      Left buttock bruised      9. OTHER SYMPTOMS: "Do you have any other symptoms?" (e.g., dizziness, fever, weakness; new onset or worsening).      Pain with inhaling on right side of chest  10. CAUSE: "What do you think caused the fall (or falling)?" (e.g., tripped, dizzy spell)       Wet steps  Protocols used: Falls and [facility]

## 2022-11-05 NOTE — Discharge Instructions (Addendum)
Your exam, labs, and x-rays are normal reassuring at this time but no signs of a serious injury related to your fall.  You can expect to be sore and stiff the next few days.  Take the prescription muscle relaxant along with prescription anti-inflammatory as discussed.  Apply ice to help reduce swelling and moist heat to help reduce tight muscles.  Consider OTC Salonpas Lidoderm patches for additional pain relief.  Follow-up with your primary provider or return to the ED as discussed.

## 2022-11-07 ENCOUNTER — Ambulatory Visit: Payer: Self-pay | Admitting: *Deleted

## 2022-11-07 ENCOUNTER — Ambulatory Visit
Admission: RE | Admit: 2022-11-07 | Discharge: 2022-11-07 | Disposition: A | Discharge status: Home / Self Care | Payer: BC Managed Care – PPO | Source: Ambulatory Visit | Attending: Emergency Medicine | Admitting: Emergency Medicine

## 2022-11-07 ENCOUNTER — Ambulatory Visit (INDEPENDENT_AMBULATORY_CARE_PROVIDER_SITE_OTHER): Payer: BC Managed Care – PPO

## 2022-11-07 ENCOUNTER — Telehealth: Payer: Self-pay | Admitting: Emergency Medicine

## 2022-11-07 VITALS — BP 118/68 | HR 79 | Temp 98.2°F | Ht 73.0 in | Wt 230.0 lb

## 2022-11-07 DIAGNOSIS — M545 Low back pain, unspecified: Secondary | ICD-10-CM

## 2022-11-07 DIAGNOSIS — R0781 Pleurodynia: Secondary | ICD-10-CM | POA: Diagnosis not present

## 2022-11-07 DIAGNOSIS — J90 Pleural effusion, not elsewhere classified: Secondary | ICD-10-CM | POA: Diagnosis not present

## 2022-11-07 DIAGNOSIS — J9811 Atelectasis: Secondary | ICD-10-CM

## 2022-11-07 DIAGNOSIS — R0782 Intercostal pain: Secondary | ICD-10-CM | POA: Diagnosis not present

## 2022-11-07 MED ORDER — FUROSEMIDE 20 MG PO TABS: 20.0000 mg | ORAL_TABLET | Freq: Every day | ORAL | 0 refills | Status: DC

## 2022-11-07 MED ORDER — TRAMADOL HCL 50 MG PO TABS: 50.0000 mg | ORAL_TABLET | Freq: Four times a day (QID) | ORAL | 0 refills | Status: DC | PRN

## 2022-11-07 MED ORDER — PREDNISONE 20 MG PO TABS: 40.0000 mg | ORAL_TABLET | Freq: Every day | ORAL | 0 refills | Status: DC

## 2022-11-07 MED ORDER — DEXAMETHASONE SODIUM PHOSPHATE 10 MG/ML IJ SOLN
10.0000 mg | Freq: Once | INTRAMUSCULAR | Status: AC
  Administered 2022-11-07: 10 mg via INTRAMUSCULAR

## 2022-11-07 NOTE — ED Provider Notes (Signed)
[facility]    CSN: [id number] Arrival date & time: 11/07/22  1640      History   Chief Complaint Chief Complaint  Patient presents with   Back Pain    Fellon Tuesday pain not improving - Entered by patient    HPI Kurt Mendoza is a 62 y.o. male.   Patient presents for evaluation of worsening right lower back pain beginning 2 days ago after fall.  Slipped off the deck landing directly onto the back.  Unable to lie flat due to pain elicited, can be felt with all movement and with deep breathing.  Pain does not radiate and is localized to 1 area.  Has attempted use of over-the-counter pain medication, muscle relaxants which has been ineffective.  Initially was evaluated in the emergency department after incident.  Denies numbness or tingling, urinary or bowel incontinence, shortness of breath, wheezing or new cough.  Past Medical History:  Diagnosis Date   Esophagitis    Gout    History of irregular heartbeat    Hypertension     Patient Active Problem List   Diagnosis Date Noted   Anxiety 10/11/2022   Drug-induced erectile dysfunction 10/11/2022   Encounter for screening colonoscopy    Adenomatous polyp of colon    Primary osteoarthritis of both first carpometacarpal joints 09/18/2020   Aortic atherosclerosis (HCC) 05/09/2020   Chronic neck pain 03/03/2019   Arthritis of both hands 07/07/2018   Gout 04/04/2015   Essential hypertension     Past Surgical History:  Procedure Laterality Date   BACK SURGERY  1997   COLONOSCOPY WITH PROPOFOL N/A 11/09/2021   Procedure: COLONOSCOPY WITH PROPOFOL;  Surgeon: Wyline Mood, MD;  Location: [facility] ENDOSCOPY;  Service: Gastroenterology;  Laterality: N/A;   KNEE SURGERY  2014   LAPAROSCOPIC APPENDECTOMY N/A 02/02/2018   Procedure: APPENDECTOMY LAPAROSCOPIC;  Surgeon: Carolan Shiver, MD;  Location: [facility] ORS;  Service: General;  Laterality: N/A;       Home Medications    Prior to Admission medications    Medication Sig Start Date End Date Taking? Authorizing Provider  allopurinol (ZYLOPRIM) 300 MG tablet TAKE 1 AND 1/2 TABLETS(450 MG) BY MOUTH DAILY 10/11/22   Karamalegos, Netta Neat, DO  cyclobenzaprine (FLEXERIL) 5 MG tablet Take 1 tablet (5 mg total) by mouth 3 (three) times daily as needed. 11/05/22   Menshew, Charlesetta Ivory, PA-C  escitalopram (LEXAPRO) 10 MG tablet Take 1 tablet (10 mg total) by mouth daily. 08/05/22   Karamalegos, Netta Neat, DO  ibuprofen (ADVIL) 800 MG tablet Take 1 tablet (800 mg total) by mouth every 8 (eight) hours as needed. 11/05/22   Menshew, Charlesetta Ivory, PA-C  losartan (COZAAR) 50 MG tablet Take 1 tablet (50 mg total) by mouth daily. 10/11/22   Karamalegos, Netta Neat, DO  sildenafil (REVATIO) 20 MG tablet Take 1-5 pills about 30 min prior to sex. Start with 1 and increase as needed. 10/11/22   Karamalegos, Netta Neat, DO  tiZANidine (ZANAFLEX) 4 MG tablet Take 0.5-1 tablets (2-4 mg total) by mouth every 8 (eight) hours as needed for muscle spasms. 08/05/22   Karamalegos, Netta Neat, DO    Family History Family History  Problem Relation Age of Onset   Hyperlipidemia Mother    Stroke Father    Hyperlipidemia Father    Diabetes Father    Heart disease Father    Thyroid disease Sister    Thyroid disease Brother     Social History Social History  Tobacco Use   Smoking status: Some Days    Types: Cigars   Smokeless tobacco: Never  Vaping Use   Vaping status: Never Used  Substance Use Topics   Alcohol use: Yes    Alcohol/week: 3.0 standard drinks of alcohol    Types: 3 Standard drinks or equivalent per week    Comment: weekend   Drug use: No     Allergies   Amlodipine and Morphine and codeine   Review of Systems Review of Systems   Physical Exam Triage Vital Signs ED Triage Vitals  Encounter Vitals Group     BP 11/07/22 1655 118/68     Systolic BP Percentile --      Diastolic BP Percentile --      Pulse Rate 11/07/22 1655 79      Resp --      Temp 11/07/22 1655 98.2 F (36.8 C)     Temp Source 11/07/22 1655 Oral     SpO2 11/07/22 1655 94 %     Weight 11/07/22 1654 230 lb (104.3 kg)     Height 11/07/22 1654 6\' 1"  (1.854 m)     Head Circumference --      Peak Flow --      Pain Score 11/07/22 1654 10     Pain Loc --      Pain Education --      Exclude from Growth Chart --    No data found.  Updated Vital Signs BP 118/68 (BP Location: Left Arm)   Pulse 79   Temp 98.2 F (36.8 C) (Oral)   Ht 6\' 1"  (1.854 m)   Wt 230 lb (104.3 kg)   SpO2 94%   BMI 30.34 kg/m   Visual Acuity Right Eye Distance:   Left Eye Distance:   Bilateral Distance:    Right Eye Near:   Left Eye Near:    Bilateral Near:     Physical Exam Constitutional:      Appearance: Normal appearance.  Eyes:     Extraocular Movements: Extraocular movements intact.  Cardiovascular:     Rate and Rhythm: Normal rate and regular rhythm.     Pulses: Normal pulses.     Heart sounds: Normal heart sounds.  Pulmonary:     Effort: Pulmonary effort is normal.     Breath sounds: Normal breath sounds.  Musculoskeletal:       Arms:     Comments: Point tenderness to the right upper lumbar region of the back, directly below the area of pain there is ecchymosis, no tenderness noted at the site, no ecchymosis or deformity present, limitations on range of motion as pain is elicited with twisting turning and bending  Neurological:     Mental Status: He is alert and oriented to person, place, and time. Mental status is at baseline.      UC Treatments / Results  Labs (all labs ordered are listed, but only abnormal results are displayed) Labs Reviewed - No data to display  EKG   Radiology No results found.  Procedures Procedures (including critical care time)  Medications Ordered in UC Medications - No data to display  Initial Impression / Assessment and Plan / UC Course  I have reviewed the triage vital signs and the nursing  notes.  Pertinent labs & imaging results that were available during my care of the patient were reviewed by me and considered in my medical decision making (see chart for details).  Lumbar back pain, atelectasis, pleural effusion  Vital signs are stable, O2 saturation 94% on room air, lungs clear to auscultation, reviewed ED visit from November 05, 2022, lumbar x-ray negative, right rib x-ray negative for fracture however showing atelectasis versus a small pleural effusion, area on imaging is consistent with point tenderness with pain on exam, discussed with patient, symptoms have worsened over the past 2 days, repeat imaging obtained, pending, Decadron injection given in office as he endorses no improvement seen with Toradol IM, prescribed prednisone burst, tramadol low-dose furosemide for 5 days for treatment of the pleural effusion, will attempt to manage outpatient as patient currently has no respiratory symptoms, compression belt applied by nursing staff prior to discharge for stability and support, recommended heat or ice, pillows size and stretching as tolerated, advise follow-up with primary doctor in 1 week for follow-up and reevaluation, given strict precautions for ED follow-up Final Clinical Impressions(s) / UC Diagnoses   Final diagnoses:  Lumbar back pain     Discharge Instructions      Your pain is most likely caused by irritation to the muscles.  X-ray of your spine completed on November 05, 2022 is negative for changes  Starting tomorrow take prednisone every morning with food as directed, this medicine helps to reduce inflammation and in turn should help with your pain, you may take Tylenol 500 to 1000 mg every 6 hours in addition to this  You may continue use of muscle relaxant as needed in addition to new prescribed medication  You may use any over-the-counter topical medication in addition to prescribed medications  You may use heating pad in 15 minute intervals as  needed for additional comfort,or you may find comfort in using ice in 10-15 minutes over affected area  Begin stretching affected area daily for 10 minutes as tolerated to further loosen muscles   When sitting and lying down place pillow underneath and between knees for support  Practice good posture: head back, shoulders back, chest forward, pelvis back and weight distributed evenly on both legs  If your pain continues to persist you may also follow-up with your primary doctor to complete further evaluation and imaging as you will need an MRI  If pain persist after recommended treatment or reoccurs if may be beneficial to follow up with orthopedic specialist for evaluation, this doctor specializes in the bones and can manage your symptoms long-term with options such as but not limited to imaging, medications or physical therapy      ED Prescriptions   None    PDMP not reviewed this encounter.   Valinda Hoar, NP 11/07/22 [phone]

## 2022-11-07 NOTE — Discharge Instructions (Addendum)
On your rib x-ray on November 05, 2022 showed no fracture to the ribs but did show possible atelectasis which means the lung is not fully expanded versus a small pleural effusion which means a amount of fluid, the area located is consistent at the area where you are experiencing most pain, possibly related therefore we will begin treatment and coverage for both  At this time you are not experiencing any respiratory symptoms such as trouble breathing and therefore we will attempt to manage this outpatient  We have repeated the rib x-ray today compared to original imaging completed on the 17th to see if this area of concern has worsened, will be called with the results  X-ray of your spine completed on November 05, 2022 is negative for changes  You have been given a compression belt to add stability, may wear as needed when completing activity  An injection of Decadron which is a steroid today in office, ideally will help to reduce inflammation and help with your pain, daily will start to see improvement in about 30 minutes to an hour  Starting tomorrow take prednisone every morning with food as directed, this medicine helps to reduce inflammation and in turn should help with your pain, you may take Tylenol 500 to 1000 mg every 6 hours in addition to this  Starting tomorrow take Lasix every morning for 5 days, this medicine is a diuretic and helps to remove fluid from the body, this medicine will cause a strong sense of urgency and frequency with urination, please ensure that you have access to a bathroom, will last for about 1-2 hours before it starts to ease off  For severe pain you may use tramadol every 6 hours, please be mindful this will make you feel sleepy  You may continue use of muscle relaxant as needed in addition to new prescribed medication  You may use any over-the-counter topical medication in addition to prescribed medications  You may use heating pad in 15 minute intervals as  needed for additional comfort,or you may find comfort in using ice in 10-15 minutes over affected area  Begin stretching affected area daily for 10 minutes as tolerated to further loosen muscles   When sitting and lying down place pillow underneath and between knees for support  Practice good posture: head back, shoulders back, chest forward, pelvis back and weight distributed evenly on both legs  Please notify your primary doctor of current symptoms and schedule follow-up appointment for 1 week  Any point if you begin to have trouble breathing, shortness of breath or wheezing please go to the nearest emergency department for immediate evaluation and further management

## 2022-11-07 NOTE — ED Triage Notes (Signed)
Patient reports that he fell down stairs on Tuesday and having lower back pain. Patient states Toradol injection did not give him any relief.

## 2022-11-07 NOTE — Telephone Encounter (Signed)
Summary: medication request   Patient called stated he is in a lot of back pain due a fall. He has a muscle relaxer but its not working and need something stronger. He wanted to come in but there were not appts before 9/24. Please f/u with patient        Called patient [phone] to review sx of back pain and medication request. No answer, LVMTCB [phone].

## 2022-11-07 NOTE — Telephone Encounter (Signed)
Attempted to call x 1, notifying patient of x-ray results, left voicemail to return phone call, MyChart message sent

## 2022-11-07 NOTE — Telephone Encounter (Signed)
Chief Complaint: Back injury Symptoms: lower back pain continues after fall on Tuesday Frequency: constant Pertinent Negatives: Patient denies chest pain, pain with urination Disposition: [] ED /[x] Urgent Care (no appt availability in office) / [] Appointment(In office/virtual)/ []  [place] [facility]/ [] Home Care/ [] Refused Recommended Disposition /[] [facility]/ []  Follow-up with PCP Additional Notes: Patient stated he fell on Tuesday and was seen in the ED. Patient was given muscle relaxer Rx and states it is not helping his pain at all. Patient stated pain is an 10/10 with movement. Patient stated he has also tried over the counter pain medication and it has not relieved the discomfort at all. Patient states he believes something is wrong. Patient requesting an appointment. Care advice given and no appointments were available in office. Patient has been scheduled today at urgent care 1645.   Reason for Disposition  [1] SEVERE pain (e.g., excruciating) AND [2] not improved 2 hours after pain medicine/ice packs  Answer Assessment - Initial Assessment Questions 1. MECHANISM: "How did the injury happen?" (Consider the possibility of domestic violence or elder abuse)     I fell on 11/05/22 2. ONSET: "When did the injury happen?" (Minutes or hours ago)     11/05/22 3. LOCATION: "What part of the back is injured?"     Lower back mostly on right side 4. SEVERITY: "Can you move the back normally?"     No, I have limited movement  5. PAIN: "Is there any pain?" If Yes, ask: "How bad is the pain?"   (Scale 1-10; or mild, moderate, severe)     10/10 with movement  6. CORD SYMPTOMS: Any weakness or numbness of the arms or legs?"     No 7. SIZE: For cuts, bruises, or swelling, ask: "How large is it?" (e.g., inches or centimeters)     Bruises but no open areas      9. OTHER SYMPTOMS: "Do you have any other symptoms?" (e.g., abdomen pain, blood in urine)     No  Protocols used: Back  Injury-A-AH

## 2022-11-20 DIAGNOSIS — L538 Other specified erythematous conditions: Secondary | ICD-10-CM | POA: Diagnosis not present

## 2022-11-20 DIAGNOSIS — D2272 Melanocytic nevi of left lower limb, including hip: Secondary | ICD-10-CM | POA: Diagnosis not present

## 2022-11-20 DIAGNOSIS — L820 Inflamed seborrheic keratosis: Secondary | ICD-10-CM | POA: Diagnosis not present

## 2022-11-20 DIAGNOSIS — D2261 Melanocytic nevi of right upper limb, including shoulder: Secondary | ICD-10-CM | POA: Diagnosis not present

## 2022-11-20 DIAGNOSIS — R208 Other disturbances of skin sensation: Secondary | ICD-10-CM | POA: Diagnosis not present

## 2022-11-20 DIAGNOSIS — D225 Melanocytic nevi of trunk: Secondary | ICD-10-CM | POA: Diagnosis not present

## 2022-11-20 DIAGNOSIS — D2262 Melanocytic nevi of left upper limb, including shoulder: Secondary | ICD-10-CM | POA: Diagnosis not present

## 2022-12-06 ENCOUNTER — Telehealth: Payer: Self-pay

## 2022-12-06 NOTE — Telephone Encounter (Signed)
Transition Care Management Unsuccessful Follow-up Telephone Call  Date of discharge and from where:  [place] 9/17  Attempts:  1st Attempt  Reason for unsuccessful TCM follow-up call:  No answer/busy   Lenard Forth [place]  [facility], [facility] Guide, Phone: [phone] Website: [email or web address]

## 2022-12-09 ENCOUNTER — Telehealth: Payer: Self-pay

## 2022-12-09 NOTE — Telephone Encounter (Signed)
Transition Care Management Unsuccessful Follow-up Telephone Call  Date of discharge and from where:  [place] 9/17  Attempts:  2nd Attempt  Reason for unsuccessful TCM follow-up call:  No answer/busy   Lenard Forth [place]  [facility], [facility] Guide, Phone: [phone] Website: [email or web address]

## 2023-01-13 ENCOUNTER — Ambulatory Visit
Admission: EM | Admit: 2023-01-13 | Discharge: 2023-01-13 | Disposition: A | Discharge status: Home / Self Care | Payer: BC Managed Care – PPO | Attending: Emergency Medicine | Admitting: Emergency Medicine

## 2023-01-13 ENCOUNTER — Ambulatory Visit: Payer: BC Managed Care – PPO

## 2023-01-13 ENCOUNTER — Encounter: Payer: Self-pay | Admitting: Emergency Medicine

## 2023-01-13 DIAGNOSIS — R058 Other specified cough: Secondary | ICD-10-CM | POA: Diagnosis not present

## 2023-01-13 DIAGNOSIS — R0602 Shortness of breath: Secondary | ICD-10-CM | POA: Diagnosis not present

## 2023-01-13 DIAGNOSIS — J069 Acute upper respiratory infection, unspecified: Secondary | ICD-10-CM | POA: Insufficient documentation

## 2023-01-13 DIAGNOSIS — R040 Epistaxis: Secondary | ICD-10-CM | POA: Insufficient documentation

## 2023-01-13 LAB — RESP PANEL BY RT-PCR (FLU A&B, COVID) ARPGX2
Influenza A by PCR: NEGATIVE
Influenza B by PCR: NEGATIVE
SARS Coronavirus 2 by RT PCR: NEGATIVE

## 2023-01-13 MED ORDER — AEROCHAMBER MV MISC: 2 refills | Status: AC

## 2023-01-13 MED ORDER — BENZONATATE 100 MG PO CAPS: 200.0000 mg | ORAL_CAPSULE | Freq: Three times a day (TID) | ORAL | 0 refills | Status: DC

## 2023-01-13 MED ORDER — PROMETHAZINE-DM 6.25-15 MG/5ML PO SYRP: 5.0000 mL | ORAL_SOLUTION | Freq: Four times a day (QID) | ORAL | 0 refills | Status: DC | PRN

## 2023-01-13 MED ORDER — ALBUTEROL SULFATE HFA 108 (90 BASE) MCG/ACT IN AERS: 2.0000 | INHALATION_SPRAY | RESPIRATORY_TRACT | 0 refills | Status: DC | PRN

## 2023-01-13 MED ORDER — IPRATROPIUM BROMIDE 0.06 % NA SOLN: 2.0000 | Freq: Four times a day (QID) | NASAL | 12 refills | Status: DC

## 2023-01-13 NOTE — ED Provider Notes (Signed)
[facility]    CSN: [id number] Arrival date & time: 01/13/23  1447      History   Chief Complaint Chief Complaint  Patient presents with   Shortness of Breath   Cough   Nasal Congestion   Generalized Body Aches    HPI Kurt Mendoza is a 62 y.o. male.   HPI  62 year old male with a past medical history significant for hypertension, irregular heartbeat, gout, and esophagitis presents for evaluation of respiratory symptoms which began acutely yesterday.  He endorses runny nose, body aches, nosebleed from the left nare, cough productive for clear sputum, and shortness of breath.  He endorses that his throat is dry but no sore throat or ear pain.  No wheezing.  Past Medical History:  Diagnosis Date   Esophagitis    Gout    History of irregular heartbeat    Hypertension     Patient Active Problem List   Diagnosis Date Noted   Anxiety 10/11/2022   Drug-induced erectile dysfunction 10/11/2022   Encounter for screening colonoscopy    Adenomatous polyp of colon    Primary osteoarthritis of both first carpometacarpal joints 09/18/2020   Aortic atherosclerosis (HCC) 05/09/2020   Chronic neck pain 03/03/2019   Arthritis of both hands 07/07/2018   Gout 04/04/2015   Essential hypertension     Past Surgical History:  Procedure Laterality Date   BACK SURGERY  1997   COLONOSCOPY WITH PROPOFOL N/A 11/09/2021   Procedure: COLONOSCOPY WITH PROPOFOL;  Surgeon: Wyline Mood, MD;  Location: [facility] ENDOSCOPY;  Service: Gastroenterology;  Laterality: N/A;   KNEE SURGERY  2014   LAPAROSCOPIC APPENDECTOMY N/A 02/02/2018   Procedure: APPENDECTOMY LAPAROSCOPIC;  Surgeon: Carolan Shiver, MD;  Location: [facility] ORS;  Service: General;  Laterality: N/A;       Home Medications    Prior to Admission medications   Medication Sig Start Date End Date Taking? Authorizing Provider  albuterol (VENTOLIN HFA) 108 (90 Base) MCG/ACT inhaler Inhale 2 puffs into the lungs every 4  (four) hours as needed. 01/13/23  Yes Becky Augusta, NP  benzonatate (TESSALON) 100 MG capsule Take 2 capsules (200 mg total) by mouth every 8 (eight) hours. 01/13/23  Yes Becky Augusta, NP  ipratropium (ATROVENT) 0.06 % nasal spray Place 2 sprays into both nostrils 4 (four) times daily. 01/13/23  Yes Becky Augusta, NP  promethazine-dextromethorphan (PROMETHAZINE-DM) 6.25-15 MG/5ML syrup Take 5 mLs by mouth 4 (four) times daily as needed. 01/13/23  Yes Becky Augusta, NP  Spacer/Aero-Holding Chambers (AEROCHAMBER MV) inhaler Use as instructed 01/13/23  Yes Becky Augusta, NP  allopurinol (ZYLOPRIM) 300 MG tablet TAKE 1 AND 1/2 TABLETS(450 MG) BY MOUTH DAILY 10/11/22   Karamalegos, Netta Neat, DO  cyclobenzaprine (FLEXERIL) 5 MG tablet Take 1 tablet (5 mg total) by mouth 3 (three) times daily as needed. 11/05/22   Menshew, Charlesetta Ivory, PA-C  escitalopram (LEXAPRO) 10 MG tablet Take 1 tablet (10 mg total) by mouth daily. 08/05/22   Karamalegos, Netta Neat, DO  furosemide (LASIX) 20 MG tablet Take 1 tablet (20 mg total) by mouth daily. 11/07/22   White, Elita Boone, NP  ibuprofen (ADVIL) 800 MG tablet Take 1 tablet (800 mg total) by mouth every 8 (eight) hours as needed. 11/05/22   Menshew, Charlesetta Ivory, PA-C  losartan (COZAAR) 50 MG tablet Take 1 tablet (50 mg total) by mouth daily. 10/11/22   Karamalegos, Netta Neat, DO  predniSONE (DELTASONE) 20 MG tablet Take 2 tablets (40 mg total) by mouth  daily. 11/07/22   Valinda Hoar, NP  sildenafil (REVATIO) 20 MG tablet Take 1-5 pills about 30 min prior to sex. Start with 1 and increase as needed. 10/11/22   Karamalegos, Netta Neat, DO  tiZANidine (ZANAFLEX) 4 MG tablet Take 0.5-1 tablets (2-4 mg total) by mouth every 8 (eight) hours as needed for muscle spasms. 08/05/22   Karamalegos, Netta Neat, DO  traMADol (ULTRAM) 50 MG tablet Take 1 tablet (50 mg total) by mouth every 6 (six) hours as needed. 11/07/22   Valinda Hoar, NP    Family History Family  History  Problem Relation Age of Onset   Hyperlipidemia Mother    Stroke Father    Hyperlipidemia Father    Diabetes Father    Heart disease Father    Thyroid disease Sister    Thyroid disease Brother     Social History Social History   Tobacco Use   Smoking status: Former    Types: Cigars   Smokeless tobacco: Never  Vaping Use   Vaping status: Never Used  Substance Use Topics   Alcohol use: Yes    Alcohol/week: 3.0 standard drinks of alcohol    Types: 3 Standard drinks or equivalent per week    Comment: weekend   Drug use: No     Allergies   Amlodipine and Morphine and codeine   Review of Systems Review of Systems  Constitutional:  Negative for fever.  HENT:  Positive for congestion, nosebleeds and rhinorrhea. Negative for ear pain and sore throat.   Respiratory:  Positive for cough and shortness of breath. Negative for wheezing.      Physical Exam Triage Vital Signs ED Triage Vitals  Encounter Vitals Group     BP 01/13/23 1504 (!) 144/85     Systolic BP Percentile --      Diastolic BP Percentile --      Pulse Rate 01/13/23 1504 68     Resp 01/13/23 1504 18     Temp 01/13/23 1504 97.9 F (36.6 C)     Temp Source 01/13/23 1504 Oral     SpO2 01/13/23 1504 96 %     Weight --      Height --      Head Circumference --      Peak Flow --      Pain Score 01/13/23 1503 0     Pain Loc --      Pain Education --      Exclude from Growth Chart --    No data found.  Updated Vital Signs BP (!) 144/85 (BP Location: Left Arm)   Pulse 68   Temp 97.9 F (36.6 C) (Oral)   Resp 18   SpO2 96%   Visual Acuity Right Eye Distance:   Left Eye Distance:   Bilateral Distance:    Right Eye Near:   Left Eye Near:    Bilateral Near:     Physical Exam Vitals and nursing note reviewed.  Constitutional:      Appearance: Normal appearance. He is not ill-appearing.  HENT:     Head: Normocephalic and atraumatic.     Right Ear: Tympanic membrane, ear canal and  external ear normal. There is no impacted cerumen.     Left Ear: Tympanic membrane, ear canal and external ear normal. There is no impacted cerumen.     Nose: Congestion and rhinorrhea present.     Comments: Nasal mucosa is erythematous and edematous.  There is dried bloody discharge in  the left nare.  Right nare demonstrates milky white discharge.    Mouth/Throat:     Mouth: Mucous membranes are moist.     Pharynx: Oropharynx is clear. Posterior oropharyngeal erythema present. No oropharyngeal exudate.     Comments: Mild erythema to the posterior oropharynx with clear postnasal drip. Cardiovascular:     Rate and Rhythm: Normal rate and regular rhythm.     Pulses: Normal pulses.     Heart sounds: Normal heart sounds. No murmur heard.    No friction rub. No gallop.  Pulmonary:     Effort: Pulmonary effort is normal.     Breath sounds: Normal breath sounds. No wheezing, rhonchi or rales.  Musculoskeletal:     Cervical back: Normal range of motion and neck supple.  Lymphadenopathy:     Cervical: No cervical adenopathy.  Skin:    General: Skin is warm and dry.     Capillary Refill: Capillary refill takes less than 2 seconds.     Findings: No rash.  Neurological:     Mental Status: He is alert.      UC Treatments / Results  Labs (all labs ordered are listed, but only abnormal results are displayed) Labs Reviewed  RESP PANEL BY RT-PCR (FLU A&B, COVID) [id number]    EKG   Radiology No results found.  Procedures Procedures (including critical care time)  Medications Ordered in UC Medications - No data to display  Initial Impression / Assessment and Plan / UC Course  I have reviewed the triage vital signs and the nursing notes.  Pertinent labs & imaging results that were available during my care of the patient were reviewed by me and considered in my medical decision making (see chart for details).   Patient is a pleasant, nontoxic-appearing 62 year old male presenting for  evaluation of acute onset of respiratory symptoms outlined HPI above.  He does have inflamed nasal mucosa with clear rhinorrhea on the right and bloody discharge on the left.  Oropharyngeal exam reveals clear postnasal drip and erythema to the posterior oropharynx.  Cardiopulmonary exam reveals: Sounds in all fields.  Patient is able to speak in full sentence without dyspnea or tachypnea though taking a deep breath does trigger a cough.  Patient's respiratory rate at triage was 18 and room air oxygen saturation was 96%.  Patient's primary complaint is that he feels short of breath and notes that he cannot catch his breath.  I will order a respiratory panel to evaluate for the presence of COVID or influenza along with a chest x-ray to evaluate for any acute cardiopulmonary pathology.  The patient reports that this time a year, when he turns on the heat, he gets nosebleeds though not as frequent as he has had them today.  He is not actively bleeding at this time.  I did discuss with the patient that this time a year, with cold dry air and dry heat, that the nasal tissues become more friable and the capillaries are close to the surface and tend to rupture easily.  He can try painting his nostrils with Vaseline at nighttime to increase moisture and decrease the friability.  Respiratory panel is negative for COVID or influenza.  Chest x-ray independently reviewed and evaluated by me.  Impression: No evidence of infiltrate or effusion.  Cardiomediastinal silhouette appears normal.  Radiology overread is pending.  I will discharge patient home with a diagnosis of viral URI with a cough and prescribe Atrovent nasal spray, Tessalon Perles, Promethazine DM cough syrup.  I will also discharge him home with a diagnosis of epistaxis and have him moisturize his nasal mucosa using Vaseline as previously discussed.   Final Clinical Impressions(s) / UC Diagnoses   Final diagnoses:  Viral URI with cough  Epistaxis      Discharge Instructions      Your testing for COVID and influenza were both negative.  Your chest x-ray did not demonstrate any evidence of pneumonia.  I do believe you have a viral respiratory infection that is causing your symptoms.  For your nosebleeds, it is most likely due to the fact that the dry air is thinning out your nasal mucosa and making your tissues more fragile.  I suggest that you paint your nasal passages using a clean Q-tip and Vaseline each night to help moisturize them and make them more resilient.  You may want to also consider running a humidifier in your bedroom at night to help moisturize your nasal tissues.  Use the albuterol inhaler, with the spacer, 1 to 2 puffs every 4-6 hours, as needed for any shortness of breath or wheezing you may experience.  Use the Atrovent nasal spray, 2 squirts in each nostril every 6 hours, as needed for runny nose and postnasal drip.  Use the Tessalon Perles every 8 hours during the day.  Take them with a small sip of water.  They may give you some numbness to the base of your tongue or a metallic taste in your mouth, this is normal.  Use the Promethazine DM cough syrup at bedtime for cough and congestion.  It will make you drowsy so do not take it during the day.  Return for reevaluation or see your primary care provider for any new or worsening symptoms.      ED Prescriptions     Medication Sig Dispense Auth. Provider   benzonatate (TESSALON) 100 MG capsule Take 2 capsules (200 mg total) by mouth every 8 (eight) hours. 21 capsule Becky Augusta, NP   ipratropium (ATROVENT) 0.06 % nasal spray Place 2 sprays into both nostrils 4 (four) times daily. 15 mL Becky Augusta, NP   promethazine-dextromethorphan (PROMETHAZINE-DM) 6.25-15 MG/5ML syrup Take 5 mLs by mouth 4 (four) times daily as needed. 118 mL Becky Augusta, NP   albuterol (VENTOLIN HFA) 108 (90 Base) MCG/ACT inhaler Inhale 2 puffs into the lungs every 4 (four) hours as  needed. 18 g Becky Augusta, NP   Spacer/Aero-Holding Chambers (AEROCHAMBER MV) inhaler Use as instructed 1 each Becky Augusta, NP      PDMP not reviewed this encounter.   Becky Augusta, NP 01/13/23 1610

## 2023-01-13 NOTE — ED Triage Notes (Signed)
Pt presents with a cough, SOB, body aches and runny nose since yesterday. Pt has taken tylenol for his symptoms.

## 2023-01-13 NOTE — Discharge Instructions (Addendum)
Your testing for COVID and influenza were both negative.  Your chest x-ray did not demonstrate any evidence of pneumonia.  I do believe you have a viral respiratory infection that is causing your symptoms.  For your nosebleeds, it is most likely due to the fact that the dry air is thinning out your nasal mucosa and making your tissues more fragile.  I suggest that you paint your nasal passages using a clean Q-tip and Vaseline each night to help moisturize them and make them more resilient.  You may want to also consider running a humidifier in your bedroom at night to help moisturize your nasal tissues.  Use the albuterol inhaler, with the spacer, 1 to 2 puffs every 4-6 hours, as needed for any shortness of breath or wheezing you may experience.  Use the Atrovent nasal spray, 2 squirts in each nostril every 6 hours, as needed for runny nose and postnasal drip.  Use the Tessalon Perles every 8 hours during the day.  Take them with a small sip of water.  They may give you some numbness to the base of your tongue or a metallic taste in your mouth, this is normal.  Use the Promethazine DM cough syrup at bedtime for cough and congestion.  It will make you drowsy so do not take it during the day.  Return for reevaluation or see your primary care provider for any new or worsening symptoms.

## 2023-04-11 ENCOUNTER — Ambulatory Visit: Payer: BC Managed Care – PPO | Admitting: Family Medicine

## 2023-04-18 ENCOUNTER — Encounter: Payer: Self-pay | Admitting: Family Medicine

## 2023-04-18 ENCOUNTER — Ambulatory Visit: Payer: BC Managed Care – PPO | Admitting: Family Medicine

## 2023-04-18 ENCOUNTER — Other Ambulatory Visit: Payer: Self-pay | Admitting: Family Medicine

## 2023-04-18 VITALS — BP 130/74 | HR 70 | Ht 73.0 in | Wt 246.0 lb

## 2023-04-18 DIAGNOSIS — R6889 Other general symptoms and signs: Secondary | ICD-10-CM | POA: Diagnosis not present

## 2023-04-18 DIAGNOSIS — Z8349 Family history of other endocrine, nutritional and metabolic diseases: Secondary | ICD-10-CM | POA: Diagnosis not present

## 2023-04-18 DIAGNOSIS — E7800 Pure hypercholesterolemia, unspecified: Secondary | ICD-10-CM | POA: Diagnosis not present

## 2023-04-18 DIAGNOSIS — Z125 Encounter for screening for malignant neoplasm of prostate: Secondary | ICD-10-CM

## 2023-04-18 DIAGNOSIS — M1A00X0 Idiopathic chronic gout, unspecified site, without tophus (tophi): Secondary | ICD-10-CM

## 2023-04-18 DIAGNOSIS — R7309 Other abnormal glucose: Secondary | ICD-10-CM

## 2023-04-18 DIAGNOSIS — Z0000 Encounter for general adult medical examination without abnormal findings: Secondary | ICD-10-CM

## 2023-04-18 DIAGNOSIS — I10 Essential (primary) hypertension: Secondary | ICD-10-CM

## 2023-04-18 NOTE — Patient Instructions (Addendum)
 Thank you for coming to the office today.  Likely weight gain is due to side effect from Lexapro Remain off medicine for a while 4- 6+ weeks to see if effective and weight loss Your diet sounds great  Possibility of poor sleep interfering with metabolism. Consider ENT vs Sleep Study  Add an extra Thyroid lab to the Fall 2025  Keep apt in August/Sept  Please schedule a Follow-up Appointment to: Return if symptoms worsen or fail to improve.  If you have any other questions or concerns, please feel free to call the office or send a message through MyChart. You may also schedule an earlier appointment if necessary.  Additionally, you may be receiving a survey about your experience at our office within a few days to 1 week by e-mail or mail. We value your feedback.  Saralyn Pilar, DO [facility], [place]

## 2023-04-18 NOTE — Progress Notes (Signed)
 Subjective:    Patient ID: Kurt Mendoza, male    DOB: 1960/09/02, 63 y.o.   MRN: [id number]  Kurt Mendoza is a 63 y.o. male presenting on 04/18/2023 for Weight Gain   HPI  Discussed the use of AI scribe software for clinical note transcription with the patient, who gave verbal consent to proceed.  History of Present Illness   Kurt Mendoza is a 63 year old male who presents with concerns about weight gain.  He has experienced a weight gain of 16 pounds over the past six months, increasing from 230 pounds to 246 pounds. Despite adhering to a diet that includes fasting from 5 PM to 10 or 11 AM and consuming healthy meals such as scrambled eggs and baked chicken, he has gained weight. He attempted a 48-hour fast a month ago, resulting in an 8-pound weight loss, but the weight returned shortly after resuming his usual diet. He has a family history of thyroid problems, with his brother having had his thyroid removed.  He has a history of taking Lexapro for mood, which he discontinued a week ago due to concerns about weight gain. He felt irritable even while on the medication. So it was not very effective. He is currently taking allopurinol daily for gout, losartan for blood pressure, and tizanidine every other day for muscle spasms. He has stopped taking tramadol and does not use Flexeril.  He reports poor sleep quality, often not sleeping at all on some nights, and uses Z-Quil to aid sleep. He has difficulty breathing through one nostril, which affects his sleep position, and he has not yet consulted an ENT specialist. He acknowledges that poor sleep may be affecting his metabolism.  He maintains a level of physical activity, although his job is less active than before. He tries to increase walking as much as possible and denies a decrease in activity level. He aims to reduce his weight to below 220 pounds.         04/18/2023    2:39 PM 10/11/2022    8:44 AM 08/05/2022    3:04 PM  Depression  screen PHQ 2/9  Decreased Interest 0 0 3  Down, Depressed, Hopeless 0 0 0  PHQ - 2 Score 0 0 3  Altered sleeping 0 0 0  Tired, decreased energy 1 0 0  Change in appetite 0 0 0  Feeling bad or failure about yourself  0 0 0  Trouble concentrating 0 0 0  Moving slowly or fidgety/restless 0 0 0  Suicidal thoughts 0 0 0  PHQ-9 Score 1 0 3  Difficult doing work/chores Not difficult at all Not difficult at all        04/18/2023    2:39 PM 10/11/2022    8:44 AM 08/05/2022    3:05 PM 05/20/2022    4:05 PM  GAD 7 : Generalized Anxiety Score  Nervous, Anxious, on Edge 1 0 0 1  Control/stop worrying 0 0 0 0  Worry too much - different things 0 0 0 0  Trouble relaxing 1 0 0 2  Restless 0 0 0 1  Easily annoyed or irritable 1 0 0 2  Afraid - awful might happen 0 0 0 1  Total GAD 7 Score 3 0 0 7  Anxiety Difficulty Not difficult at all Not difficult at all      Social History   Tobacco Use   Smoking status: Former    Types: Cigars   Smokeless tobacco: Never  Vaping  Use   Vaping status: Never Used  Substance Use Topics   Alcohol use: Yes    Alcohol/week: 3.0 standard drinks of alcohol    Types: 3 Standard drinks or equivalent per week    Comment: weekend   Drug use: No    Review of Systems Per HPI unless specifically indicated above     Objective:    BP 130/74   Pulse 70   Ht 6\' 1"  (1.854 m)   Wt 246 lb (111.6 kg)   SpO2 94%   BMI 32.46 kg/m   Wt Readings from Last 3 Encounters:  04/18/23 246 lb (111.6 kg)  11/07/22 230 lb (104.3 kg)  11/05/22 230 lb (104.3 kg)    Physical Exam Vitals and nursing note reviewed.  Constitutional:      General: He is not in acute distress.    Appearance: Normal appearance. He is well-developed. He is not diaphoretic.     Comments: Well-appearing, comfortable, cooperative  HENT:     Head: Normocephalic and atraumatic.  Eyes:     General:        Right eye: No discharge.        Left eye: No discharge.     Conjunctiva/sclera:  Conjunctivae normal.  Cardiovascular:     Rate and Rhythm: Normal rate.  Pulmonary:     Effort: Pulmonary effort is normal.  Skin:    General: Skin is warm and dry.     Findings: No erythema or rash.  Neurological:     Mental Status: He is alert and oriented to person, place, and time.  Psychiatric:        Mood and Affect: Mood normal.        Behavior: Behavior normal.        Thought Content: Thought content normal.     Comments: Well groomed, good eye contact, normal speech and thoughts     Results for orders placed or performed during the hospital encounter of 01/13/23  Resp Panel by RT-PCR (Flu A&B, Covid) Anterior Nasal Swab   Collection Time: 01/13/23  3:27 PM   Specimen: Anterior Nasal Swab  Result Value Ref Range   SARS Coronavirus 2 by RT PCR NEGATIVE NEGATIVE   Influenza A by PCR NEGATIVE NEGATIVE   Influenza B by PCR NEGATIVE NEGATIVE      Assessment & Plan:   Problem List Items Addressed This Visit   None Visit Diagnoses       Abnormal weight    -  Primary     Elevated LDL cholesterol level         Family history of thyroid disease            Unexplained Weight Gain Despite maintaining a healthy diet, intermittent fasting, regular physical activity, the patient has experienced a significant weight gain over the past 6 months. Possible contributing factors include recent use of Lexapro and potential sleep disturbances. -Discontinue Lexapro and monitor for changes in weight over the next 4-6 weeks. -Consider ENT consultation or sleep study to evaluate for sleep disturbances potentially affecting metabolism if he may have possible underlying OSA interfering with regular sleep pattern  Thyroid Function Family history of thyroid disease and concern for potential thyroid-related weight gain. Previous TSH levels have been normal. -Add T4 to thyroid panel during next scheduled labs in August 2025.  General Health Maintenance Regular physical scheduled for August  2025. -Keep appointment as scheduled.         No orders of the defined  types were placed in this encounter.   No orders of the defined types were placed in this encounter.   Follow up plan: Return if symptoms worsen or fail to improve.  Future labs ordered for 10/17/23 all labs + TSH T4 FUTURE August 2025   Saralyn Pilar, DO [facility] 04/18/2023, 2:16 PM

## 2023-06-20 ENCOUNTER — Other Ambulatory Visit: Payer: Self-pay | Admitting: Family Medicine

## 2023-06-20 DIAGNOSIS — G8929 Other chronic pain: Secondary | ICD-10-CM

## 2023-06-23 NOTE — Telephone Encounter (Signed)
 Requested medication (s) are due for refill today: yesyes  Requested medication (s) are on the active medication list: yes  Last refill 08/05/22   // Future visit scheduled: yes  Notes to clinic:  Unable to refill per protocol, cannot delegate.      Requested Prescriptions  Pending Prescriptions Disp Refills   tiZANidine  (ZANAFLEX ) 4 MG tablet [Pharmacy Med Name: TIZANIDINE  4MG  TABLETS] 90 tablet 3    Sig: TAKE 1/2 TO 1 TABLET(2 TO 4 MG) BY MOUTH EVERY 8 HOURS AS NEEDED FOR MUSCLE SPASMS     Not Delegated - Cardiovascular:  Alpha-2 Agonists - tizanidine  Failed - 06/23/2023  3:51 PM      Failed - This refill cannot be delegated      Passed - Valid encounter within last 6 months    Recent Outpatient Visits           2 months ago Abnormal weight   [place] [facility] [place], Kayleen Party, DO       Future Appointments             In 4 months [place], Kayleen Party, DO [facility]

## 2023-10-17 ENCOUNTER — Other Ambulatory Visit: Payer: Self-pay

## 2023-10-17 DIAGNOSIS — Z8349 Family history of other endocrine, nutritional and metabolic diseases: Secondary | ICD-10-CM | POA: Diagnosis not present

## 2023-10-17 DIAGNOSIS — R7309 Other abnormal glucose: Secondary | ICD-10-CM | POA: Diagnosis not present

## 2023-10-17 DIAGNOSIS — I10 Essential (primary) hypertension: Secondary | ICD-10-CM

## 2023-10-17 DIAGNOSIS — Z125 Encounter for screening for malignant neoplasm of prostate: Secondary | ICD-10-CM

## 2023-10-17 DIAGNOSIS — M1A00X0 Idiopathic chronic gout, unspecified site, without tophus (tophi): Secondary | ICD-10-CM

## 2023-10-17 DIAGNOSIS — E7800 Pure hypercholesterolemia, unspecified: Secondary | ICD-10-CM | POA: Diagnosis not present

## 2023-10-17 DIAGNOSIS — Z0000 Encounter for general adult medical examination without abnormal findings: Secondary | ICD-10-CM

## 2023-10-17 DIAGNOSIS — R6889 Other general symptoms and signs: Secondary | ICD-10-CM

## 2023-10-18 LAB — LIPID PANEL
Cholesterol: 187 mg/dL (ref <200)
HDL: 45 mg/dL (ref >=40)
LDL Cholesterol (Calc): 119 mg/dL — ABNORMAL HIGH
Non-HDL Cholesterol (Calc): 142 mg/dL — ABNORMAL HIGH (ref <130)
Total CHOL/HDL Ratio: 4.2 (calc) (ref <5.0)
Triglycerides: 120 mg/dL (ref <150)

## 2023-10-18 LAB — PSA: PSA: 0.57 ng/mL (ref <=4.00)

## 2023-10-18 LAB — CBC WITH DIFFERENTIAL/PLATELET
Absolute Lymphocytes: 2312 {cells}/uL (ref 850–3900)
Absolute Monocytes: 725 {cells}/uL (ref 200–950)
Basophils Absolute: 38 {cells}/uL (ref 0–200)
Basophils Relative: 0.6 %
Eosinophils Absolute: 69 {cells}/uL (ref 15–500)
Eosinophils Relative: 1.1 %
HCT: 46.7 % (ref 38.5–50.0)
Hemoglobin: 15.9 g/dL (ref 13.2–17.1)
MCH: 31.9 pg (ref 27.0–33.0)
MCHC: 34 g/dL (ref 32.0–36.0)
MCV: 93.8 fL (ref 80.0–100.0)
MPV: 11 fL (ref 7.5–12.5)
Monocytes Relative: 11.5 %
Neutro Abs: 3156 {cells}/uL (ref 1500–7800)
Neutrophils Relative %: 50.1 %
Platelets: 179 Thousand/uL (ref 140–400)
RBC: 4.98 Million/uL (ref 4.20–5.80)
RDW: 13.1 % (ref 11.0–15.0)
Total Lymphocyte: 36.7 %
WBC: 6.3 Thousand/uL (ref 3.8–10.8)

## 2023-10-18 LAB — COMPLETE METABOLIC PANEL WITHOUT GFR
AG Ratio: 1.8 (calc) (ref 1.0–2.5)
ALT: 84 U/L — ABNORMAL HIGH (ref 9–46)
AST: 58 U/L — ABNORMAL HIGH (ref 10–35)
Albumin: 4.7 g/dL (ref 3.6–5.1)
Alkaline phosphatase (APISO): 76 U/L (ref 35–144)
BUN/Creatinine Ratio: 25 (calc) — ABNORMAL HIGH (ref 6–22)
BUN: 16 mg/dL (ref 7–25)
CO2: 27 mmol/L (ref 20–32)
Calcium: 9.3 mg/dL (ref 8.6–10.3)
Chloride: 101 mmol/L (ref 98–110)
Creat: 0.65 mg/dL — ABNORMAL LOW (ref 0.70–1.35)
Globulin: 2.6 g/dL (ref 1.9–3.7)
Glucose, Bld: 88 mg/dL (ref 65–99)
Potassium: 4.1 mmol/L (ref 3.5–5.3)
Sodium: 138 mmol/L (ref 135–146)
Total Bilirubin: 1 mg/dL (ref 0.2–1.2)
Total Protein: 7.3 g/dL (ref 6.1–8.1)

## 2023-10-18 LAB — HEMOGLOBIN A1C
Hgb A1c MFr Bld: 5.2 % (ref <5.7)
Mean Plasma Glucose: 103 mg/dL
eAG (mmol/L): 5.7 mmol/L

## 2023-10-18 LAB — T4, FREE: Free T4: 1.2 ng/dL (ref 0.8–1.8)

## 2023-10-18 LAB — URIC ACID: Uric Acid, Serum: 4.4 mg/dL (ref 4.0–8.0)

## 2023-10-24 ENCOUNTER — Ambulatory Visit (INDEPENDENT_AMBULATORY_CARE_PROVIDER_SITE_OTHER): Payer: Self-pay | Admitting: Family Medicine

## 2023-10-24 ENCOUNTER — Encounter: Payer: Self-pay | Admitting: Family Medicine

## 2023-10-24 VITALS — BP 132/80 | HR 66 | Ht 73.0 in | Wt 235.1 lb

## 2023-10-24 DIAGNOSIS — M1A00X0 Idiopathic chronic gout, unspecified site, without tophus (tophi): Secondary | ICD-10-CM | POA: Diagnosis not present

## 2023-10-24 DIAGNOSIS — I700 Atherosclerosis of aorta: Secondary | ICD-10-CM

## 2023-10-24 DIAGNOSIS — Z0000 Encounter for general adult medical examination without abnormal findings: Secondary | ICD-10-CM

## 2023-10-24 DIAGNOSIS — E7800 Pure hypercholesterolemia, unspecified: Secondary | ICD-10-CM | POA: Diagnosis not present

## 2023-10-24 DIAGNOSIS — I10 Essential (primary) hypertension: Secondary | ICD-10-CM

## 2023-10-24 DIAGNOSIS — Z8349 Family history of other endocrine, nutritional and metabolic diseases: Secondary | ICD-10-CM

## 2023-10-24 MED ORDER — LOSARTAN POTASSIUM 50 MG PO TABS: 50.0000 mg | ORAL_TABLET | Freq: Every day | ORAL | 3 refills | Status: AC

## 2023-10-24 MED ORDER — ALLOPURINOL 300 MG PO TABS: ORAL_TABLET | ORAL | 3 refills | Status: AC

## 2023-10-24 NOTE — Patient Instructions (Addendum)
 Thank you for coming to the office today.  No rx cholesterol medication required  Reconsider vaccines in the 65+ Pneumonia vaccine prevnar-20 when ready next year.  Future consideration reduce the Allopurinol  from 1.5 pills down to 1 = 300mg  for gout prevention. Not yet but we can do this in the future.  Refilled current medications for 1 year.  DUE for FASTING BLOOD WORK (no food or drink after midnight before the lab appointment, only water or coffee without cream/sugar on the morning of)  SCHEDULE Lab Only visit in the morning at the clinic for lab draw in 1 YEAR  - Make sure Lab Only appointment is at about 1 week before your next appointment, so that results will be available  For Lab Results, once available within 2-3 days of blood draw, you can can log in to MyChart online to view your results and a brief explanation. Also, we can discuss results at next follow-up visit.    Please schedule a Follow-up Appointment to: Return for 1 year fasting lab > 1 week later Annual Physical.  If you have any other questions or concerns, please feel free to call the office or send a message through MyChart. You may also schedule an earlier appointment if necessary.  Additionally, you may be receiving a survey about your experience at our office within a few days to 1 week by e-mail or mail. We value your feedback.  Marsa Officer, DO [facility], [place]

## 2023-10-24 NOTE — Progress Notes (Signed)
 Subjective:    Patient ID: Kurt Mendoza, male    DOB: 10/12/1960, 63 y.o.   MRN: [id number]  Kurt Mendoza is a 63 y.o. male presenting on 10/24/2023 for Annual Exam   HPI  Discussed the use of AI scribe software for clinical note transcription with the patient, who gave verbal consent to proceed.  History of Present Illness   Kurt Mendoza is a 63 year old male who presents for an annual physical exam.  Dyslipidemia and cardiovascular risk - Stable cholesterol levels over the past two years - LDL approximately 119 mg/dL - Previous heart scan completed 2024 very low calcium score and no obstructive CAD. History of Aortic Atherosclerosis  Hepatic enzyme elevation - Recent increase in liver enzymes: AST 58 U/L, ALT 84 U/L - Attribution to recent alcohol consumption at a bachelor party - Similar mild elevation occurred four years ago - Liver enzymes typically return to normal within weeks  Renal function and electrolyte status - Normal kidney function - Normal electrolytes  Glycemic control - Blood sugar level 5.2%  Weight management and dietary habits - Following a 16:8 intermittent fasting diet - Weight loss of 11 pounds over the past six months (current weight 235 pounds, previously 246 pounds)  Chronic Gout Previously followed by Dr Tobie [facility] Gout management - History of gout, managed with allopurinol  300mg  1.5 pills daily - Uric acid level 4.4 mg/dL - No recent gout flare-ups; last episode in 2021  Osteoarthritis, multiple joints Chronic Neck Pain Hand pain/stiffness Previously followed by Orthopedics Takes Tizanidine  1 tab nightly with good results   CHRONIC HTN: Doing well home BP checks Current Meds - Losartan  50mg  daily Reports good compliance, took meds today. Tolerating well, w/o complaints. Denies CP, dyspnea, HA, edema, dizziness / lightheadedness   Aortic Atherosclerosis On prior imaging. Not on statin  Anxiety - resolved - Discontinued  Lexapro  (escitalopram ) without issues since cessation  Preventive health screening - PSA and thyroid levels previously checked         Health Maintenance:   PSA 0.57, negative, prior 0.78   Colonoscopy last 11/09/21, next due 3 years, repeat 2026  Declines Prevnar and Shingrix     10/24/2023    8:48 AM 04/18/2023    2:39 PM 10/11/2022    8:44 AM  Depression screen PHQ 2/9  Decreased Interest 0 0 0  Down, Depressed, Hopeless 0 0 0  PHQ - 2 Score 0 0 0  Altered sleeping  0 0  Tired, decreased energy  1 0  Change in appetite  0 0  Feeling bad or failure about yourself   0 0  Trouble concentrating  0 0  Moving slowly or fidgety/restless  0 0  Suicidal thoughts  0 0  PHQ-9 Score  1 0  Difficult doing work/chores  Not difficult at all Not difficult at all       10/24/2023    8:48 AM 04/18/2023    2:39 PM 10/11/2022    8:44 AM 08/05/2022    3:05 PM  GAD 7 : Generalized Anxiety Score  Nervous, Anxious, on Edge 0 1 0 0  Control/stop worrying 0 0 0 0  Worry too much - different things 0 0 0 0  Trouble relaxing 0 1 0 0  Restless 0 0 0 0  Easily annoyed or irritable 0 1 0 0  Afraid - awful might happen 0 0 0 0  Total GAD 7 Score 0 3 0 0  Anxiety Difficulty  Not difficult  at all Not difficult at all      Past Medical History:  Diagnosis Date   Esophagitis    Gout    History of irregular heartbeat    Hypertension    Past Surgical History:  Procedure Laterality Date   BACK SURGERY  1997   COLONOSCOPY WITH PROPOFOL  N/A 11/09/2021   Procedure: COLONOSCOPY WITH PROPOFOL ;  Surgeon: Therisa Bi, MD;  [facility] ENDOSCOPY;  Service: Gastroenterology;  Laterality: N/A;   KNEE SURGERY  2014   LAPAROSCOPIC APPENDECTOMY N/A 02/02/2018   Procedure: APPENDECTOMY LAPAROSCOPIC;  Surgeon: Rodolph Romano, MD;  Location: [facility] ORS;  Service: General;  Laterality: N/A;   Social History   Socioeconomic History   Marital status: Married    Spouse name: Olam   Number of  children: Not on file   Years of education: High School   Highest education level: High school graduate  Occupational History   Not on file  Tobacco Use   Smoking status: Former    Types: Cigars   Smokeless tobacco: Never  Vaping Use   Vaping status: Never Used  Substance and Sexual Activity   Alcohol use: Yes    Alcohol/week: 3.0 standard drinks of alcohol    Types: 3 Standard drinks or equivalent per week    Comment: weekend   Drug use: No   Sexual activity: Not on file  Other Topics Concern   Not on file  Social History Narrative   Not on file   Social Drivers of Health   Financial Resource Strain: Not on file  Food Insecurity: Not on file  Transportation Needs: Not on file  Physical Activity: Not on file  Stress: Not on file  Social Connections: Not on file  Intimate Partner Violence: Not on file   Family History  Problem Relation Age of Onset   Hyperlipidemia Mother    Stroke Father    Hyperlipidemia Father    Diabetes Father    Heart disease Father    Thyroid disease Sister    Thyroid disease Brother    Current Outpatient Medications on File Prior to Visit  Medication Sig   albuterol  (VENTOLIN  HFA) 108 (90 Base) MCG/ACT inhaler Inhale 2 puffs into the lungs every 4 (four) hours as needed.   ibuprofen  (ADVIL ) 800 MG tablet Take 1 tablet (800 mg total) by mouth every 8 (eight) hours as needed.   sildenafil  (REVATIO ) 20 MG tablet Take 1-5 pills about 30 min prior to sex. Start with 1 and increase as needed.   Spacer/Aero-Holding Chambers (AEROCHAMBER MV) inhaler Use as instructed   tiZANidine  (ZANAFLEX ) 4 MG tablet TAKE 1/2 TO 1 TABLET(2 TO 4 MG) BY MOUTH EVERY 8 HOURS AS NEEDED FOR MUSCLE SPASMS   No current facility-administered medications on file prior to visit.    Review of Systems  Constitutional:  Negative for activity change, appetite change, chills, diaphoresis, fatigue and fever.  HENT:  Negative for congestion and hearing loss.   Eyes:  Negative  for visual disturbance.  Respiratory:  Negative for cough, chest tightness, shortness of breath and wheezing.   Cardiovascular:  Negative for chest pain, palpitations and leg swelling.  Gastrointestinal:  Negative for abdominal pain, constipation, diarrhea, nausea and vomiting.  Genitourinary:  Negative for dysuria, frequency and hematuria.  Musculoskeletal:  Negative for arthralgias and neck pain.  Skin:  Negative for rash.  Neurological:  Negative for dizziness, weakness, light-headedness, numbness and headaches.  Hematological:  Negative for adenopathy.  Psychiatric/Behavioral:  Negative for behavioral problems,  dysphoric mood and sleep disturbance.    Per HPI unless specifically indicated above     Objective:    BP 132/80 (BP Location: Left Arm, Patient Position: Sitting, Cuff Size: Normal)   Pulse 66   Ht 6' 1 (1.854 m)   Wt 235 lb 2 oz (106.7 kg)   SpO2 97%   BMI 31.02 kg/m   Wt Readings from Last 3 Encounters:  10/24/23 235 lb 2 oz (106.7 kg)  04/18/23 246 lb (111.6 kg)  11/07/22 230 lb (104.3 kg)    Physical Exam Vitals and nursing note reviewed.  Constitutional:      General: He is not in acute distress.    Appearance: He is well-developed. He is not diaphoretic.     Comments: Well-appearing, comfortable, cooperative  HENT:     Head: Normocephalic and atraumatic.  Eyes:     General:        Right eye: No discharge.        Left eye: No discharge.     Conjunctiva/sclera: Conjunctivae normal.     Pupils: Pupils are equal, round, and reactive to light.  Neck:     Thyroid: No thyromegaly.  Cardiovascular:     Rate and Rhythm: Normal rate and regular rhythm.     Pulses: Normal pulses.     Heart sounds: Normal heart sounds. No murmur heard. Pulmonary:     Effort: Pulmonary effort is normal. No respiratory distress.     Breath sounds: Normal breath sounds. No wheezing or rales.  Abdominal:     General: Bowel sounds are normal. There is no distension.      Palpations: Abdomen is soft. There is no mass.     Tenderness: There is no abdominal tenderness.  Musculoskeletal:        General: No tenderness. Normal range of motion.     Cervical back: Normal range of motion and neck supple.     Comments: Upper / Lower Extremities: - Normal muscle tone, strength bilateral upper extremities 5/5, lower extremities 5/5  Lymphadenopathy:     Cervical: No cervical adenopathy.  Skin:    General: Skin is warm and dry.     Findings: No erythema or rash.  Neurological:     Mental Status: He is alert and oriented to person, place, and time.     Comments: Distal sensation intact to light touch all extremities  Psychiatric:        Mood and Affect: Mood normal.        Behavior: Behavior normal.        Thought Content: Thought content normal.     Comments: Well groomed, good eye contact, normal speech and thoughts     I have personally reviewed the radiology report from 06/13/22 on CT Coronary Morph.  ADDENDUM REPORT: 06/18/2022 16:35   EXAM: OVER-READ INTERPRETATION  CT CHEST   The following report is an over-read performed by radiologist Dr. Suzen Dials of [facility] on 06/18/2022. This over-read does not include interpretation of cardiac or coronary anatomy or pathology. The coronary calcium score/coronary CTA interpretation by the cardiologist is attached.   COMPARISON:  March 16, 2007   FINDINGS: Cardiovascular: There are no significant extracardiac vascular findings.   Mediastinum/Nodes: There are no enlarged lymph nodes within the visualized mediastinum.   Lungs/Pleura: There is no pleural effusion. Mild right middle lobe linear atelectasis is noted.   Upper abdomen: No significant findings in the visualized upper abdomen.   Musculoskeletal/Chest wall: No chest wall  mass or suspicious osseous findings within the visualized chest.   IMPRESSION: No significant extracardiac findings within the visualized chest.      Electronically Signed   By: Suzen Dials M.D.   On: 06/18/2022 16:35    Addended by Dials Suzen BIRCH, MD on 06/18/2022  4:37 PM    Study Result  Narrative & Impression  CLINICAL DATA:  Chest pain   EXAM: Cardiac/Coronary  CTA   TECHNIQUE: The patient was scanned on a Siemens Somatom go.Top scanner.   : A retrospective scan was triggered in the ascending thoracic aorta. Axial non-contrast 3 mm slices were carried out through the heart. The data set was analyzed on a dedicated work station and scored using the Agatson method. Gantry rotation speed was 330 msecs and collimation was .6 mm. 100mg  of metoprolol  and 0.8 mg of sl NTG was given. The 3D data set was reconstructed in 5% intervals of the 60-95 % of the R-R cycle. Diastolic phases were analyzed on a dedicated work station using MPR, MIP and VRT modes. The patient received 100 cc of contrast.   FINDINGS: Aorta:  Normal size.  No calcifications.  No dissection.   Aortic Valve:  Trileaflet.  No calcifications.   Coronary Arteries:  Normal coronary origin.  Right dominance.   RCA is a dominant artery. There is no plaque.   Left main gives rise to LAD and LCX arteries. LM has no disease.   LAD has no plaque.   LCX is a non-dominant artery. There is calcified plaque in the proximal LCx causing minimal stenosis (<25%).   Other findings:   Normal pulmonary vein drainage into the left atrium.   Normal left atrial appendage without a thrombus.   Normal size of the pulmonary artery.   IMPRESSION: 1. Coronary calcium score of 19.8. This was 40th percentile for age and sex matched control.   2. Normal coronary origin with right dominance.   3. Minimal proximal LCx stenosis (<25%).   4. CAD-RADS 1. Minimal non-obstructive CAD (0-24%). Consider non-atherosclerotic causes of chest pain. Consider preventive therapy and risk factor modification.   Electronically Signed: By: Redell Cave M.D. On:  06/13/2022 18:10     Results for orders placed or performed in visit on 10/17/23  Uric acid   Collection Time: 10/17/23  9:16 AM  Result Value Ref Range   Uric Acid, Serum 4.4 4.0 - 8.0 mg/dL  T4, free   Collection Time: 10/17/23  9:16 AM  Result Value Ref Range   Free T4 1.2 0.8 - 1.8 ng/dL  PSA   Collection Time: 10/17/23  9:16 AM  Result Value Ref Range   PSA 0.57 < OR = 4.00 ng/mL  CBC with Differential/Platelet   Collection Time: 10/17/23  9:16 AM  Result Value Ref Range   WBC 6.3 3.8 - 10.8 Thousand/uL   RBC 4.98 4.20 - 5.80 Million/uL   Hemoglobin 15.9 13.2 - 17.1 g/dL   HCT 53.2 61.4 - 49.9 %   MCV 93.8 80.0 - 100.0 fL   MCH 31.9 27.0 - 33.0 pg   MCHC 34.0 32.0 - 36.0 g/dL   RDW 86.8 88.9 - 84.9 %   Platelets 179 140 - 400 Thousand/uL   MPV 11.0 7.5 - 12.5 fL   Neutro Abs 3,156 1,500 - 7,800 cells/uL   Absolute Lymphocytes 2,312 850 - 3,900 cells/uL   Absolute Monocytes 725 200 - 950 cells/uL   Eosinophils Absolute 69 15 - 500 cells/uL   Basophils Absolute  38 0 - 200 cells/uL   Neutrophils Relative % 50.1 %   Total Lymphocyte 36.7 %   Monocytes Relative 11.5 %   Eosinophils Relative 1.1 %   Basophils Relative 0.6 %  COMPLETE METABOLIC PANEL WITH GFR   Collection Time: 10/17/23  9:16 AM  Result Value Ref Range   Glucose, Bld 88 65 - 99 mg/dL   BUN 16 7 - 25 mg/dL   Creat 9.34 (L) 9.29 - 1.35 mg/dL   BUN/Creatinine Ratio 25 (H) 6 - 22 (calc)   Sodium 138 135 - 146 mmol/L   Potassium 4.1 3.5 - 5.3 mmol/L   Chloride 101 98 - 110 mmol/L   CO2 27 20 - 32 mmol/L   Calcium 9.3 8.6 - 10.3 mg/dL   Total Protein 7.3 6.1 - 8.1 g/dL   Albumin 4.7 3.6 - 5.1 g/dL   Globulin 2.6 1.9 - 3.7 g/dL (calc)   AG Ratio 1.8 1.0 - 2.5 (calc)   Total Bilirubin 1.0 0.2 - 1.2 mg/dL   Alkaline phosphatase (APISO) 76 35 - 144 U/L   AST 58 (H) 10 - 35 U/L   ALT 84 (H) 9 - 46 U/L  Hemoglobin A1c   Collection Time: 10/17/23  9:16 AM  Result Value Ref Range   Hgb A1c MFr Bld 5.2  <5.7 %   Mean Plasma Glucose 103 mg/dL   eAG (mmol/L) 5.7 mmol/L  Lipid panel   Collection Time: 10/17/23  9:16 AM  Result Value Ref Range   Cholesterol 187 <200 mg/dL   HDL 45 > OR = 40 mg/dL   Triglycerides 879 <849 mg/dL   LDL Cholesterol (Calc) 119 (H) mg/dL (calc)   Total CHOL/HDL Ratio 4.2 <5.0 (calc)   Non-HDL Cholesterol (Calc) 142 (H) <130 mg/dL (calc)      Assessment & Plan:   Problem List Items Addressed This Visit     Aortic atherosclerosis (HCC)   Relevant Medications   losartan  (COZAAR ) 50 MG tablet   Essential hypertension   Relevant Medications   losartan  (COZAAR ) 50 MG tablet   Gout   Relevant Medications   allopurinol  (ZYLOPRIM ) 300 MG tablet   Other Visit Diagnoses       Annual physical exam    -  Primary     Elevated LDL cholesterol level         Family history of thyroid disease            Updated Health Maintenance information Reviewed recent lab results with patient Encouraged improvement to lifestyle with diet and exercise Goal of weight loss   Adult Wellness Visit Annual wellness visit conducted. Blood pressure 132/80 mmHg. Weight decreased to 235 lbs. Blood sugar 5.2, no prediabetes. LDL 119 mg/dL. PSA 0.57. Normal thyroid, kidney function, and electrolytes. - Continue current diet and exercise regimen. - Schedule next annual wellness visit in one year.  Essential hypertension Blood pressure 132/80 mmHg. Previous fluctuations possibly related to weight and gout flare-ups. - Continue current antihypertensive medication (losartan ). - Refill losartan  prescription for one year.  Gout, currently controlled Gout well-controlled with uric acid level 4.4 mg/dL. No flare-ups since 2021. Discussed dietary triggers. - Continue current allopurinol  dosage. 450mg  daily taking 300mg  tab x 1.5 = 450mg  per day - Consider reducing allopurinol  to 1 pill if condition remains stable. To 300mg  maybe next visit - Refill allopurinol  prescription for one  year.  Hyperlipidemia, currently stable Aortic Atherosclerosis LDL 119 mg/dL. Previous heart scan CT coronary eval showed no major plaque.  Mild current risk, no medication required. - Continue monitoring cholesterol levels. - Consider cholesterol medication if levels increase or as age-related risk increases.  Obesity Weight decreased to 235 lbs. Engaged in a 16:8 fasting diet. - Continue current diet and exercise regimen.  Elevated liver enzymes, likely transient Mild elevation in liver enzymes (AST 58, ALT 84) likely due to recent alcohol consumption prior to lab. Previously had been normal - Monitor liver enzymes. - Repeat liver function tests in one year.  Precancerous colon polyps Previous colonoscopy in 2023 showed precancerous polyps. - Schedule follow-up colonoscopy in 2026.  Anxiety Previously on escitalopram , now off medication and asymptomatic.  General Health Maintenance No current need for pneumonia or shingles vaccines. Colonoscopy follow-up scheduled for 2026. - Reconsider vaccines in the future, especially pneumonia vaccine at age 56.       No orders of the defined types were placed in this encounter.   Meds ordered this encounter  Medications   allopurinol  (ZYLOPRIM ) 300 MG tablet    Sig: TAKE 1 AND 1/2 TABLETS(450 MG) BY MOUTH DAILY    Dispense:  135 tablet    Refill:  3    Add future refills   losartan  (COZAAR ) 50 MG tablet    Sig: Take 1 tablet (50 mg total) by mouth daily.    Dispense:  90 tablet    Refill:  3    Keep future refills on file     Follow up plan: Return for 1 year fasting lab > 1 week later Annual Physical.  Future labs 10/22/2024   Marsa Officer, DO [facility] 10/24/2023, 8:53 AM

## 2023-10-25 ENCOUNTER — Other Ambulatory Visit: Payer: Self-pay | Admitting: Family Medicine

## 2023-10-25 DIAGNOSIS — Z125 Encounter for screening for malignant neoplasm of prostate: Secondary | ICD-10-CM

## 2023-10-25 DIAGNOSIS — Z8349 Family history of other endocrine, nutritional and metabolic diseases: Secondary | ICD-10-CM

## 2023-10-25 DIAGNOSIS — M1A00X0 Idiopathic chronic gout, unspecified site, without tophus (tophi): Secondary | ICD-10-CM

## 2023-10-25 DIAGNOSIS — R7309 Other abnormal glucose: Secondary | ICD-10-CM

## 2023-10-25 DIAGNOSIS — E7800 Pure hypercholesterolemia, unspecified: Secondary | ICD-10-CM

## 2023-10-25 DIAGNOSIS — I10 Essential (primary) hypertension: Secondary | ICD-10-CM

## 2023-10-25 DIAGNOSIS — Z0000 Encounter for general adult medical examination without abnormal findings: Secondary | ICD-10-CM

## 2023-12-05 ENCOUNTER — Encounter: Payer: Self-pay | Admitting: Family Medicine

## 2023-12-05 DIAGNOSIS — G8929 Other chronic pain: Secondary | ICD-10-CM

## 2023-12-05 MED ORDER — TIZANIDINE HCL 4 MG PO TABS: 4.0000 mg | ORAL_TABLET | Freq: Three times a day (TID) | ORAL | 3 refills | Status: AC | PRN

## 2023-12-11 ENCOUNTER — Ambulatory Visit: Admitting: Family Medicine

## 2023-12-11 ENCOUNTER — Encounter: Payer: Self-pay | Admitting: Family Medicine

## 2023-12-11 VITALS — BP 138/84 | HR 65 | Ht 73.0 in | Wt 238.0 lb

## 2023-12-11 DIAGNOSIS — N6342 Unspecified lump in left breast, subareolar: Secondary | ICD-10-CM | POA: Diagnosis not present

## 2023-12-11 DIAGNOSIS — N644 Mastodynia: Secondary | ICD-10-CM

## 2023-12-11 NOTE — Patient Instructions (Addendum)
 Thank you for coming to the office today.  Mammogram + Ultrasound Left sided to evaluate the bump under the Left nipple/breast  After 1 week if not called w/ apt, you can call them  [facility] [address] [facility] [place], [place] [address] Phone: [phone]  -----------  For the surface burning Unsure exact cause, likely superficial nerve either injury or crush or damage from the furniture trauma or possibly rare case of a shingles flare can cause burning BEFORE a rash. If you get a blistering rash in a linear pattern patch, should seek care for Shingles ASAP  Brown mole like spots on the nipple, are safe and are non cancerous - look like normal skin growth Seborrheic Keratoses.  Please schedule a Follow-up Appointment to: Return if symptoms worsen or fail to improve.  If you have any other questions or concerns, please feel free to call the office or send a message through MyChart. You may also schedule an earlier appointment if necessary.  Additionally, you may be receiving a survey about your experience at our office within a few days to 1 week by e-mail or mail. We value your feedback.  Marsa Officer, DO [facility], [place]

## 2023-12-11 NOTE — Progress Notes (Signed)
 Subjective:    Patient ID: Kurt Mendoza, male    DOB: 02-Oct-1960, 63 y.o.   MRN: [id number]  Kurt Mendoza is a 63 y.o. male presenting on 12/11/2023 for Breast Pain (Started this morning)  Patient presents for a same day appointment.   HPI  Discussed the use of AI scribe software for clinical note transcription with the patient, who gave verbal consent to proceed.  History of Present Illness   Kurt Mendoza is a 63 year old male who presents with burning pain in the left nipple area.  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  - Firm, nodular bump under the left nipple present for at least two years - No significant change in size or mobility over time  - Recently started tizanidine  - Uncertain if symptoms are related to tizanidine  use     12/11/2023   11:06 AM 10/24/2023    8:48 AM 04/18/2023    2:39 PM  Depression screen PHQ 2/9  Decreased Interest 0 0 0  Down, Depressed, Hopeless 0 0 0  PHQ - 2 Score 0 0 0  Altered sleeping   0  Tired, decreased energy   1  Change in appetite   0  Feeling bad or failure about yourself    0  Trouble concentrating   0  Moving slowly or fidgety/restless   0  Suicidal thoughts   0  PHQ-9 Score   1  Difficult doing work/chores   Not difficult at all       12/11/2023   11:06 AM 10/24/2023    8:48 AM 04/18/2023    2:39 PM 10/11/2022    8:44 AM  GAD 7 :  Generalized Anxiety Score  Nervous, Anxious, on Edge 0 0 1 0  Control/stop worrying 0 0 0 0  Worry too much - different things 0 0 0 0  Trouble relaxing 0 0 1 0  Restless 0 0 0 0  Easily annoyed or irritable 0 0 1 0  Afraid - awful might happen 0 0 0 0  Total GAD 7 Score 0 0 3 0  Anxiety Difficulty   Not difficult at all Not difficult at all    Social History   Tobacco Use   Smoking status: Former    Types: Cigars   Smokeless tobacco: Never  Vaping Use   Vaping status: Never Used  Substance Use Topics   Alcohol use: Yes    Alcohol/week: 3.0 standard drinks of alcohol    Types: 3 Standard drinks or equivalent per week    Comment: weekend   Drug use: No    Review of Systems Per HPI unless specifically indicated above     Objective:    BP 138/84 (BP Location: Left Arm, Cuff Size: Normal)   Pulse 65   Ht 6' 1 (1.854 m)   Wt 238 lb (108 kg)   SpO2 98%  BMI 31.40 kg/m   Wt Readings from Last 3 Encounters:  12/11/23 238 lb (108 kg)  10/24/23 235 lb 2 oz (106.7 kg)  04/18/23 246 lb (111.6 kg)    Physical Exam Vitals and nursing note reviewed.  Constitutional:      General: He is not in acute distress.    Appearance: Normal appearance. He is well-developed. He is not diaphoretic.     Comments: Well-appearing, comfortable, cooperative  HENT:     Head: Normocephalic and atraumatic.  Eyes:     General:        Right eye: No discharge.        Left eye: No discharge.     Conjunctiva/sclera: Conjunctivae normal.  Cardiovascular:     Rate and Rhythm: Normal rate.  Pulmonary:     Effort: Pulmonary effort is normal.  Chest:       Comments: Also seborrheic keratoses x 2 on areola nipple. Skin:    General: Skin is warm and dry.     Findings: Lesion present. No erythema or rash.  Neurological:     Mental Status: He is alert and oriented to person, place, and time.  Psychiatric:        Mood and Affect: Mood normal.        Behavior: Behavior normal.        Thought  Content: Thought content normal.     Comments: Well groomed, good eye contact, normal speech and thoughts     Results for orders placed or performed in visit on 10/17/23  Uric acid   Collection Time: 10/17/23  9:16 AM  Result Value Ref Range   Uric Acid, Serum 4.4 4.0 - 8.0 mg/dL  T4, free   Collection Time: 10/17/23  9:16 AM  Result Value Ref Range   Free T4 1.2 0.8 - 1.8 ng/dL  PSA   Collection Time: 10/17/23  9:16 AM  Result Value Ref Range   PSA 0.57 < OR = 4.00 ng/mL  CBC with Differential/Platelet   Collection Time: 10/17/23  9:16 AM  Result Value Ref Range   WBC 6.3 3.8 - 10.8 Thousand/uL   RBC 4.98 4.20 - 5.80 Million/uL   Hemoglobin 15.9 13.2 - 17.1 g/dL   HCT 53.2 61.4 - 49.9 %   MCV 93.8 80.0 - 100.0 fL   MCH 31.9 27.0 - 33.0 pg   MCHC 34.0 32.0 - 36.0 g/dL   RDW 86.8 88.9 - 84.9 %   Platelets 179 140 - 400 Thousand/uL   MPV 11.0 7.5 - 12.5 fL   Neutro Abs 3,156 1,500 - 7,800 cells/uL   Absolute Lymphocytes 2,312 850 - 3,900 cells/uL   Absolute Monocytes 725 200 - 950 cells/uL   Eosinophils Absolute 69 15 - 500 cells/uL   Basophils Absolute 38 0 - 200 cells/uL   Neutrophils Relative % 50.1 %   Total Lymphocyte 36.7 %   Monocytes Relative 11.5 %   Eosinophils Relative 1.1 %   Basophils Relative 0.6 %  COMPLETE METABOLIC PANEL WITH GFR   Collection Time: 10/17/23  9:16 AM  Result Value Ref Range   Glucose, Bld 88 65 - 99 mg/dL   BUN 16 7 - 25 mg/dL   Creat 9.34 (L) 9.29 - 1.35 mg/dL   BUN/Creatinine Ratio 25 (H) 6 - 22 (calc)   Sodium 138 135 - 146 mmol/L   Potassium 4.1 3.5 - 5.3 mmol/L   Chloride 101 98 - 110 mmol/L   CO2 27 20 - 32 mmol/L  Calcium 9.3 8.6 - 10.3 mg/dL   Total Protein 7.3 6.1 - 8.1 g/dL   Albumin 4.7 3.6 - 5.1 g/dL   Globulin 2.6 1.9 - 3.7 g/dL (calc)   AG Ratio 1.8 1.0 - 2.5 (calc)   Total Bilirubin 1.0 0.2 - 1.2 mg/dL   Alkaline phosphatase (APISO) 76 35 - 144 U/L   AST 58 (H) 10 - 35 U/L   ALT 84 (H) 9 - 46 U/L  Hemoglobin  A1c   Collection Time: 10/17/23  9:16 AM  Result Value Ref Range   Hgb A1c MFr Bld 5.2 <5.7 %   Mean Plasma Glucose 103 mg/dL   eAG (mmol/L) 5.7 mmol/L  Lipid panel   Collection Time: 10/17/23  9:16 AM  Result Value Ref Range   Cholesterol 187 <200 mg/dL   HDL 45 > OR = 40 mg/dL   Triglycerides 879 <849 mg/dL   LDL Cholesterol (Calc) 119 (H) mg/dL (calc)   Total CHOL/HDL Ratio 4.2 <5.0 (calc)   Non-HDL Cholesterol (Calc) 142 (H) <130 mg/dL (calc)      Assessment & Plan:   Problem List Items Addressed This Visit   None Visit Diagnoses       Pain of left breast    -  Primary     Subareolar mass of left breast       Relevant Orders   US  LIMITED ULTRASOUND INCLUDING AXILLA LEFT BREAST    MM 3D DIAGNOSTIC MAMMOGRAM BILATERAL BREAST        Left nipple burning and sensitivity Recent onset acute burning and sensitivity likely due to superficial nerve irritation or trauma with recent contusion or minor injury from moving furniture. Differential includes rare possibility of early shingles without rash onset yet  Could be symptomatic from seborrheic keratoses  - Observe for rash development, especially blistering in a linear pattern. - Consider daily moisturizers or lidocaine  cream for relief if no other symptoms arise - Seek urgent care if rash develops for possible shingles infection flare  Subareolar left breast lump Separate issue from the sensitivity pain above Firm, nodular subareolar lump present for two years, unchanged until recent burning. Low malignancy suspicion, imaging for reassurance. - Order left breast ultrasound and mammogram. - Follow up with results for further management.        Orders Placed This Encounter  Procedures   US  LIMITED ULTRASOUND INCLUDING AXILLA LEFT BREAST     Standing Status:   Future    Expiration Date:   06/10/2024    Reason for Exam (SYMPTOM  OR DIAGNOSIS REQUIRED):   >2 years, left breast subareolar firm nodular mass 1 to 1.5 cm size  mobile non tender but has burning on surface    Preferred imaging location?:   [facility]   MM 3D DIAGNOSTIC MAMMOGRAM BILATERAL BREAST    Standing Status:   Future    Expiration Date:   12/10/2024    Reason for Exam (SYMPTOM  OR DIAGNOSIS REQUIRED):   >2 years, left breast subareolar firm nodular mass 1 to 1.5 cm size mobile non tender but has burning on surface    Preferred imaging location?:   [facility]    No orders of the defined types were placed in this encounter.   Follow up plan: Return if symptoms worsen or fail to improve.  Marsa Officer, DO [facility] 12/11/2023, 11:22 AM

## 2023-12-15 ENCOUNTER — Other Ambulatory Visit

## 2023-12-15 ENCOUNTER — Encounter

## 2023-12-17 ENCOUNTER — Ambulatory Visit
Admission: RE | Admit: 2023-12-17 | Discharge: 2023-12-17 | Disposition: A | Discharge status: Home / Self Care | Source: Ambulatory Visit | Attending: Family Medicine | Admitting: Family Medicine

## 2023-12-17 ENCOUNTER — Encounter: Payer: Self-pay | Admitting: Family Medicine

## 2023-12-17 DIAGNOSIS — N6325 Unspecified lump in the left breast, overlapping quadrants: Secondary | ICD-10-CM | POA: Diagnosis not present

## 2023-12-17 DIAGNOSIS — N6342 Unspecified lump in left breast, subareolar: Secondary | ICD-10-CM | POA: Diagnosis not present

## 2023-12-17 DIAGNOSIS — N6322 Unspecified lump in the left breast, upper inner quadrant: Secondary | ICD-10-CM | POA: Diagnosis not present

## 2023-12-18 ENCOUNTER — Other Ambulatory Visit: Payer: Self-pay | Admitting: Family Medicine

## 2023-12-18 DIAGNOSIS — R928 Other abnormal and inconclusive findings on diagnostic imaging of breast: Secondary | ICD-10-CM

## 2023-12-22 ENCOUNTER — Ambulatory Visit
Admission: RE | Admit: 2023-12-22 | Discharge: 2023-12-22 | Disposition: A | Discharge status: Home / Self Care | Source: Ambulatory Visit | Attending: Family Medicine | Admitting: Family Medicine

## 2023-12-22 DIAGNOSIS — R928 Other abnormal and inconclusive findings on diagnostic imaging of breast: Secondary | ICD-10-CM | POA: Insufficient documentation

## 2023-12-22 DIAGNOSIS — C50112 Malignant neoplasm of central portion of left female breast: Secondary | ICD-10-CM | POA: Insufficient documentation

## 2023-12-22 DIAGNOSIS — C50012 Malignant neoplasm of nipple and areola, left female breast: Secondary | ICD-10-CM | POA: Diagnosis not present

## 2023-12-22 HISTORY — PX: BREAST BIOPSY: SHX20

## 2023-12-22 MED ORDER — LIDOCAINE-EPINEPHRINE 1 %-1:100000 IJ SOLN
5.0000 mL | Freq: Once | INTRAMUSCULAR | Status: AC
  Administered 2023-12-22: 5 mL
  Filled 2023-12-22: qty 5

## 2023-12-22 MED ORDER — LIDOCAINE 1 % OPTIME INJ - NO CHARGE
2.0000 mL | Freq: Once | INTRAMUSCULAR | Status: AC
  Administered 2023-12-22: 2 mL
  Filled 2023-12-22: qty 2

## 2023-12-23 ENCOUNTER — Other Ambulatory Visit: Payer: Self-pay

## 2023-12-23 DIAGNOSIS — C50922 Malignant neoplasm of unspecified site of left male breast: Secondary | ICD-10-CM

## 2023-12-23 LAB — SURGICAL PATHOLOGY

## 2023-12-23 NOTE — Progress Notes (Signed)
 Spoke to Mr. Hacker and appointment made for medical oncology, 12/26/2023 at 1400. Referral sent to [facility].

## 2023-12-26 ENCOUNTER — Inpatient Hospital Stay

## 2023-12-26 ENCOUNTER — Encounter: Payer: Self-pay | Admitting: Internal Medicine

## 2023-12-26 ENCOUNTER — Inpatient Hospital Stay: Attending: Internal Medicine | Admitting: Internal Medicine

## 2023-12-26 DIAGNOSIS — C50822 Malignant neoplasm of overlapping sites of left male breast: Secondary | ICD-10-CM | POA: Diagnosis not present

## 2023-12-26 DIAGNOSIS — Z170 Estrogen receptor positive status [ER+]: Secondary | ICD-10-CM | POA: Insufficient documentation

## 2023-12-26 NOTE — Progress Notes (Signed)
 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   Discussed the use of AI scribe software for clinical note transcription with the patient, who gave verbal consent to proceed.  History of Present Illness Kurt Mendoza is a 63 year old male who presents with a breast lump. He was referred by Doctor Edman MD for evaluation of a breast lump.  He has had a lump in his breast for approximately a year, although he is uncertain of the exact duration. Initially, he experienced a burning sensation in the area, described as feeling like a bee sting, which prompted him to seek medical attention. This sensation has since resolved.  A mammogram and ultrasound identified a 1.5 cm mass in the left breast, and a biopsy was performed.  He has a history of hypertension and gout, with no recent gout flares in the past three years since his medication was regulated. No heart problems or other significant health issues are reported.  His brother recently passed away from stage four lung  cancer, and his mother, who is 43, was diagnosed with leukemia two years ago. There is no family history of breast, ovarian, prostate, or pancreatic cancers.  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 He has two children and seven grandchildren. He does not smoke, except for an occasional cigar on weekends.  No lumps or bumps elsewhere, and no heart problems.   Review of Systems  Constitutional:  Negative for chills, diaphoresis, fever, malaise/fatigue and weight loss.  HENT:  Negative for nosebleeds and sore throat.   Eyes:  Negative for double vision.  Respiratory:  Negative for cough, hemoptysis, sputum production, shortness of breath and wheezing.   Cardiovascular:  Negative for chest pain, palpitations, orthopnea and leg swelling.  Gastrointestinal:  Negative for abdominal pain, blood in stool, constipation, diarrhea, heartburn, melena, nausea and vomiting.  Genitourinary:  Negative for dysuria, frequency and urgency.  Musculoskeletal:  Negative for back pain and joint pain.  Skin: Negative.  Negative for itching and rash.  Neurological:  Negative for dizziness, tingling, focal weakness, weakness and headaches.  Endo/Heme/Allergies:  Does not bruise/bleed easily.  Psychiatric/Behavioral:  Negative for depression. The patient is not nervous/anxious and does not have insomnia.     MEDICAL HISTORY:  Past Medical History:  Diagnosis Date   Esophagitis    Gout    History of irregular heartbeat    Hypertension     SURGICAL HISTORY: Past Surgical History:  Procedure Laterality Date   BACK SURGERY  1997   BREAST BIOPSY Left 12/22/2023   US  LT BREAST BX W LOC DEV 1ST LESION IMG BX SPEC US  GUIDE  12/22/2023 [facility]   COLONOSCOPY WITH PROPOFOL  N/A 11/09/2021   Procedure: COLONOSCOPY WITH PROPOFOL ;  Surgeon: Therisa Bi, MD;  Location: [facility];  Service: Gastroenterology;  Laterality: N/A;   KNEE SURGERY  2014   LAPAROSCOPIC APPENDECTOMY N/A  02/02/2018   Procedure: APPENDECTOMY LAPAROSCOPIC;  Surgeon: Rodolph Romano, MD;  Location: [facility];  Service: General;  Laterality: N/A;    SOCIAL HISTORY: Social History   Socioeconomic History   Marital status: Married    Spouse name: Olam   Number of children: Not on file   Years of education: High School   Highest education level: High school graduate  Occupational History   Not on file  Tobacco Use   Smoking status: Former    Types: Cigars   Smokeless tobacco: Never  Vaping Use   Vaping status: Never Used  Substance and Sexual Activity   Alcohol use: Yes    Alcohol/week: 3.0 standard drinks of alcohol    Types: 3 Standard drinks or equivalent per week    Comment: weekend   Drug use: No   Sexual activity: Not on file  Other Topics Concern   Not on file  Social History Narrative   Not on file   Social Drivers of Health   Financial Resource Strain: Not on file  Food Insecurity: No Food Insecurity (12/26/2023)   Hunger Vital Sign    Worried About Running Out of Food in the Last Year: Never true    Ran Out of Food in the Last Year: Never true  Transportation Needs: No Transportation Needs (12/26/2023)   PRAPARE - Administrator, Civil Service (Medical): No    Lack of Transportation (Non-Medical): No  Physical Activity: Not on file  Stress: Not on file  Social Connections: Not on file  Intimate Partner Violence: Not At Risk (12/26/2023)   Humiliation, Afraid, Rape, and Kick questionnaire    Fear of Current or Ex-Partner: No    Emotionally Abused: No    Physically Abused: No    Sexually Abused: No    FAMILY HISTORY: Family History  Problem Relation Age of Onset   Hyperlipidemia Mother    Leukemia Mother    Stroke Father    Hyperlipidemia Father    Diabetes Father    Heart disease Father    Thyroid disease Sister    Thyroid disease Brother    Lung cancer Brother    Liver cancer Brother    Breast cancer Neg Hx     ALLERGIES:  is  allergic to amlodipine and morphine and codeine.  MEDICATIONS:  Current Outpatient Medications  Medication Sig Dispense Refill   allopurinol  (ZYLOPRIM ) 300 MG tablet TAKE 1 AND 1/2 TABLETS(450 MG) BY MOUTH DAILY 135 tablet 3   ibuprofen  (ADVIL ) 800 MG tablet Take 1 tablet (800 mg total) by mouth every 8 (eight) hours as needed. 30 tablet 0   losartan  (COZAAR ) 50 MG tablet Take 1 tablet (50 mg total) by mouth daily. 90 tablet 3   Spacer/Aero-Holding Chambers (AEROCHAMBER MV) inhaler Use as instructed 1 each 2   tiZANidine  (ZANAFLEX ) 4 MG tablet Take 1 tablet (4 mg total) by mouth every 8 (eight) hours as needed for muscle spasms. 90 tablet 3   No current facility-administered medications for this visit.    PHYSICAL EXAMINATION:   Vitals:   12/26/23 1355  BP: 131/76  Pulse: (!) 58  Resp: 18  Temp: (!) 97 F (36.1 C)  SpO2: 95%   Filed Weights   12/26/23  1355  Weight: 233 lb 1.6 oz (105.7 kg)    Physical Exam Vitals and nursing note reviewed.  HENT:     Head: Normocephalic and atraumatic.     Mouth/Throat:     Pharynx: Oropharynx is clear.  Eyes:     Extraocular Movements: Extraocular movements intact.     Pupils: Pupils are equal, round, and reactive to light.  Cardiovascular:     Rate and Rhythm: Normal rate and regular rhythm.  Pulmonary:     Comments: Decreased breath sounds bilaterally.  Abdominal:     Palpations: Abdomen is soft.  Musculoskeletal:        General: Normal range of motion.     Cervical back: Normal range of motion.  Skin:    General: Skin is warm.  Neurological:     General: No focal deficit present.     Mental Status: He is alert and oriented to person, place, and time.  Psychiatric:        Behavior: Behavior normal.        Judgment: Judgment normal.     LABORATORY DATA:  I have reviewed the data as listed Lab Results  Component Value Date   WBC 6.3 10/17/2023   HGB 15.9 10/17/2023   HCT 46.7 10/17/2023   MCV 93.8 10/17/2023   PLT  179 10/17/2023   Recent Labs    10/17/23 0916  NA 138  K 4.1  CL 101  CO2 27  GLUCOSE 88  BUN 16  CREATININE 0.65*  CALCIUM 9.3  PROT 7.3  AST 58*  ALT 84*  BILITOT 1.0    RADIOGRAPHIC STUDIES: I have personally reviewed the radiological images as listed and agreed with the findings in the report. US  LT BREAST BX W LOC DEV 1ST LESION IMG BX SPEC US  GUIDE Addendum Date: 12/23/2023 ADDENDUM REPORT: 12/23/2023 12:56 ADDENDUM: PATHOLOGY revealed: Breast, left, needle core biopsy, retroareolar breast mass, venus- INVASIVE DUCTAL CARCINOMA - OVERALL GRADE: II/III - LYMPHOVASCULAR INVASION: NOT IDENTIFIED - CANCER LENGTH: 13 MM IN GREATEST LINEAR DIMENSION - CALCIFICATIONS: NOT IDENTIFIED Pathology results are CONCORDANT with imaging findings, per Dr. Corean Salter. Pathology results and recommendations were discussed with patient via telephone on 12/23/2023 by Mliss Molt RN. Patient reported biopsy site doing well with no adverse symptoms, and only slight tenderness at the site. Post biopsy care instructions were reviewed, questions were answered and my direct phone number was provided. Patient was instructed to call [facility] for any additional questions or concerns related to biopsy site. RECOMMENDATIONS: 1. Surgical and oncological consultation. Request for surgical and oncological consultation relayed to Shasta Ada RN at [facility] by Mliss Molt RN on 12/23/2023. Pathology results reported by Mliss Molt RN 12/23/2023. Electronically Signed   By: Corean Salter M.D.   On: 12/23/2023 12:56   Result Date: 12/23/2023 CLINICAL DATA:  Palpable LEFT breast mass EXAM: ULTRASOUND GUIDED LEFT BREAST CORE NEEDLE BIOPSY COMPARISON:  Previous exam(s). PROCEDURE: I met with the patient and we discussed the procedure of ultrasound-guided biopsy, including benefits and alternatives. We discussed the high likelihood of a successful procedure. We discussed the risks of  the procedure, including infection, bleeding, tissue injury, clip migration, and inadequate sampling. Informed written consent was given. The usual time-out protocol was performed immediately prior to the procedure. Lesion quadrant: Upper outer quadrant Using sterile technique and 1% lidocaine  and 1% lidocaine  with epinephrine  as local anesthetic, under direct ultrasound visualization, a 14 gauge spring-loaded device was used to perform biopsy  of a retroareolar LEFT breast mass using a lateral approach. At the conclusion of the procedure a venus shaped tissue marker clip was deployed into the biopsy cavity. Follow up 2 view mammogram was performed and dictated separately. IMPRESSION: Ultrasound guided biopsy of a LEFT breast mass. No apparent complications. Electronically Signed: By: Corean Salter M.D. On: 12/22/2023 10:17   MM CLIP PLACEMENT LEFT Result Date: 12/22/2023 CLINICAL DATA:  Status post ultrasound-guided biopsy EXAM: 3D DIAGNOSTIC LEFT MAMMOGRAM POST ULTRASOUND BIOPSY COMPARISON:  Previous exam(s). ACR Breast Density Category a: The breasts are almost entirely fatty. FINDINGS: 3D Mammographic images were obtained following ultrasound guided biopsy of a LEFT breast mass. The venus biopsy marking clip is in expected position at the site of biopsy. Skin marker was placed at site of a mole in the upper inner breast. This confirms that the superficial oval mass noted on diagnostic mammogram corresponds to a skin mole. IMPRESSION: 1. Appropriate positioning of the venus shaped biopsy marking clip at the site of biopsy in the retroareolar breast. 2. Additional previously described superficial oval mass corresponds to a skin mole. Final Assessment: Post Procedure Mammograms for Marker Placement Electronically Signed   By: Corean Salter M.D.   On: 12/22/2023 10:17   MM 3D DIAGNOSTIC MAMMOGRAM BILATERAL BREAST Result Date: 12/17/2023 CLINICAL DATA:  63 year old male complaining of a palpable left  breast mass. EXAM: DIGITAL DIAGNOSTIC BILATERAL MAMMOGRAM WITH TOMOSYNTHESIS AND CAD; ULTRASOUND LEFT BREAST LIMITED TECHNIQUE: Bilateral digital diagnostic mammography and breast tomosynthesis was performed. The images were evaluated with computer-aided detection. ; Targeted ultrasound examination of the left breast was performed. COMPARISON:  Previous exam(s). ACR Breast Density Category a: The breasts are almost entirely fatty. FINDINGS: Right Breast: No suspicious mass or malignant type microcalcifications identified in the right breast. Left Breast: 1. There is a 1.7 cm ovoid mass with lobulated margins in the retroareolar region of the left breast. 2. There is a superficial 2 mm circumscribed mass in the upper inner quadrant of the breast. On physical exam, 1. I palpate a discrete mass in the retroareolar region of the left breast. 2. I do not palpate a mass in the upper-inner quadrant of the left breast. There is a small round mole in the upper-inner quadrant likely corresponding with the mammographic abnormality. Targeted ultrasound is performed, showing 1. An irregular hypoechoic mass with irregular borders, taller than wide in the 3 o'clock retroareolar region of the left breast measuring 1.5 x 1.2 x 1.5 cm. 2. Sonographic evaluation of the upper inner quadrant does not show any additional abnormality. The small mass in the upper inner quadrant is thought to correspond with a skin mole. IMPRESSION: 1. Suspicious 1.5 cm mass in the 3 o'clock retroareolar region of the left breast. 2. Probable skin mole in the upper inner quadrant of the left breast corresponding with the mammographic abnormality. RECOMMENDATION: 1. Ultrasound-guided core biopsy of the 1.5 cm mass in the retroareolar region of the left breast is recommended. 2. Post biopsy mammogram with mole marker on the mole seen in the upper-inner quadrant is recommended to confirm the second mass seen mammographically corresponds with the skin mole. I  have discussed the findings and recommendations with the patient. If applicable, a reminder letter will be sent to the patient regarding the next appointment. BI-RADS CATEGORY  4: Suspicious. Electronically Signed   By: Dina  Arceo M.D.   On: 12/17/2023 16:52   US  LIMITED ULTRASOUND INCLUDING AXILLA LEFT BREAST  Result Date: 12/17/2023 CLINICAL DATA:  63 year old  male complaining of a palpable left breast mass. EXAM: DIGITAL DIAGNOSTIC BILATERAL MAMMOGRAM WITH TOMOSYNTHESIS AND CAD; ULTRASOUND LEFT BREAST LIMITED TECHNIQUE: Bilateral digital diagnostic mammography and breast tomosynthesis was performed. The images were evaluated with computer-aided detection. ; Targeted ultrasound examination of the left breast was performed. COMPARISON:  Previous exam(s). ACR Breast Density Category a: The breasts are almost entirely fatty. FINDINGS: Right Breast: No suspicious mass or malignant type microcalcifications identified in the right breast. Left Breast: 1. There is a 1.7 cm ovoid mass with lobulated margins in the retroareolar region of the left breast. 2. There is a superficial 2 mm circumscribed mass in the upper inner quadrant of the breast. On physical exam, 1. I palpate a discrete mass in the retroareolar region of the left breast. 2. I do not palpate a mass in the upper-inner quadrant of the left breast. There is a small round mole in the upper-inner quadrant likely corresponding with the mammographic abnormality. Targeted ultrasound is performed, showing 1. An irregular hypoechoic mass with irregular borders, taller than wide in the 3 o'clock retroareolar region of the left breast measuring 1.5 x 1.2 x 1.5 cm. 2. Sonographic evaluation of the upper inner quadrant does not show any additional abnormality. The small mass in the upper inner quadrant is thought to correspond with a skin mole. IMPRESSION: 1. Suspicious 1.5 cm mass in the 3 o'clock retroareolar region of the left breast. 2. Probable skin mole in the  upper inner quadrant of the left breast corresponding with the mammographic abnormality. RECOMMENDATION: 1. Ultrasound-guided core biopsy of the 1.5 cm mass in the retroareolar region of the left breast is recommended. 2. Post biopsy mammogram with mole marker on the mole seen in the upper-inner quadrant is recommended to confirm the second mass seen mammographically corresponds with the skin mole. I have discussed the findings and recommendations with the patient. If applicable, a reminder letter will be sent to the patient regarding the next appointment. BI-RADS CATEGORY  4: Suspicious. Electronically Signed   By: Dina  Arceo M.D.   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  # I had a long discussion with the patient in general regarding the treatment options of breast cancer including-surgery; adjuvant radiation; role of adjuvant systemic therapy including-chemotherapy antihormone therapy.   # Given the early stage disease-and T1 primary-recommend proceeding with upfront surgery and sentinel lymph node biopsy.  Given the characteristics chemotherapy is less likely to be recommended. Patient will benefit from antihormone therapy-like tamoxifen.  I discussed the potential benefits of each option; and also potential downsides in detail.  Patient awaiting evaluation with surgery next week [Dr.Dennis]   # Hx of gout  # Family Hx of cancer- brother- lung-smoker. mother with leukemia.  No other family history of malignancies noted.  However given male breast cancer reasonable to proceed with genetic counseling.  Will make a referral to genetics.  Thank you Dr.Karamalegos  MD for allowing me to participate in the care of your pleasant  patient. Please do not hesitate to contact me with questions or concerns in the interim. Informed Dr.Dennis/Ms.Moore.   # DISPOSITION:  # genetic counselling re: male breast cancer ASAP # no labs- needed # follow up TBD-Dr.B   Above plan of care was discussed with  patient/family in detail.  My contact information was given to the patient/family.     Cindy JONELLE Joe, MD 12/28/2023 4:01 PM

## 2023-12-26 NOTE — Progress Notes (Signed)
 BX 12/22/23, breast.

## 2023-12-26 NOTE — Assessment & Plan Note (Addendum)
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  # I had a long discussion with the patient in general regarding the treatment options of breast cancer including-surgery; adjuvant radiation; role of adjuvant systemic therapy including-chemotherapy antihormone therapy.   # Given the early stage disease-and T1 primary-recommend proceeding with upfront surgery and sentinel lymph node biopsy.  Given the characteristics chemotherapy is less likely to be recommended. Patient will benefit from antihormone therapy-like tamoxifen.  I discussed the potential benefits of each option; and also potential downsides in detail.  Patient awaiting evaluation with surgery next week [Dr.Dennis]   # Hx of gout  # Family Hx of cancer- brother- lung-smoker. mother with leukemia.  No other family history of malignancies noted.  However given male breast cancer reasonable to proceed with genetic counseling.  Will make a referral to genetics.  Thank you Dr.Karamalegos  MD for allowing me to participate in the care of your pleasant patient. Please do not hesitate to contact me with questions or concerns in the interim. Informed Dr.Dennis/Ms.Moore.   # DISPOSITION:  # genetic counselling re: male breast cancer ASAP # no labs- needed # follow up TBD-Dr.B

## 2023-12-29 ENCOUNTER — Telehealth: Payer: Self-pay | Admitting: *Deleted

## 2023-12-29 NOTE — Telephone Encounter (Signed)
 Called the radiology reading room to ask Dr. Arceo to comment on lymph nodes on ultrasound performed 10/29.   They will notify Dr. Arceo of the request for the addendum.

## 2023-12-30 ENCOUNTER — Encounter: Payer: Self-pay | Admitting: General Surgery

## 2023-12-30 ENCOUNTER — Ambulatory Visit: Payer: Self-pay | Admitting: General Surgery

## 2023-12-30 ENCOUNTER — Encounter: Payer: Self-pay | Admitting: *Deleted

## 2023-12-30 VITALS — BP 168/85 | HR 70 | Temp 98.1°F | Ht 73.0 in | Wt 238.4 lb

## 2023-12-30 DIAGNOSIS — Z170 Estrogen receptor positive status [ER+]: Secondary | ICD-10-CM | POA: Diagnosis not present

## 2023-12-30 DIAGNOSIS — C50922 Malignant neoplasm of unspecified site of left male breast: Secondary | ICD-10-CM

## 2023-12-30 NOTE — Progress Notes (Signed)
 Patient ID: Kurt Mendoza, male   DOB: 07-29-1960, 63 y.o.   MRN: [id number] CC: Left Breast Cancer History of Present Illness Kurt Mendoza is a 63 y.o. male with past medical history of hypertension who presents in consultation for left breast cancer.  The patient reports that several weeks ago he hit his left chest and then developed some burning pain.  He went to his primary care physician who was concerned that it was shingles.  However the pain persisted and he noticed a lump in his left breast.  He had an ultrasound and mammogram that was concerning for mass.  He subsequently underwent a biopsy of this mass that was consistent with invasive ductal carcinoma.  The patient reports no other overlying skin changes and denies any drainage from his nipple.  He denies any history of breast cancer in his family.  He does have a mother that has leukemia and had a brother that died of lung cancer recently.SABRA  He is a non-smoker although occasionally will smoke cigars.  He does not drink every day.  He works a office manager.  Past Medical History Past Medical History:  Diagnosis Date   Esophagitis    Gout    History of irregular heartbeat    Hypertension        Past Surgical History:  Procedure Laterality Date   BACK SURGERY  1997   BREAST BIOPSY Left 12/22/2023   US  LT BREAST BX W LOC DEV 1ST LESION IMG BX SPEC US  GUIDE 12/22/2023 [facility]   COLONOSCOPY WITH PROPOFOL  N/A 11/09/2021   Procedure: COLONOSCOPY WITH PROPOFOL ;  Surgeon: Therisa Bi, MD;  Location: [facility] ENDOSCOPY;  Service: Gastroenterology;  Laterality: N/A;   KNEE SURGERY  2014   LAPAROSCOPIC APPENDECTOMY N/A 02/02/2018   Procedure: APPENDECTOMY LAPAROSCOPIC;  Surgeon: Rodolph Romano, MD;  Location: [facility] ORS;  Service: General;  Laterality: N/A;    Allergies  Allergen Reactions   Amlodipine Swelling   Morphine And Codeine Itching    Current Outpatient Medications  Medication Sig Dispense Refill   allopurinol   (ZYLOPRIM ) 300 MG tablet TAKE 1 AND 1/2 TABLETS(450 MG) BY MOUTH DAILY 135 tablet 3   ibuprofen  (ADVIL ) 800 MG tablet Take 1 tablet (800 mg total) by mouth every 8 (eight) hours as needed. 30 tablet 0   losartan  (COZAAR ) 50 MG tablet Take 1 tablet (50 mg total) by mouth daily. 90 tablet 3   Spacer/Aero-Holding Chambers (AEROCHAMBER MV) inhaler Use as instructed 1 each 2   tiZANidine  (ZANAFLEX ) 4 MG tablet Take 1 tablet (4 mg total) by mouth every 8 (eight) hours as needed for muscle spasms. 90 tablet 3   No current facility-administered medications for this visit.    Family History Family History  Problem Relation Age of Onset   Hyperlipidemia Mother    Leukemia Mother    Stroke Father    Hyperlipidemia Father    Diabetes Father    Heart disease Father    Thyroid disease Sister    Thyroid disease Brother    Lung cancer Brother    Liver cancer Brother    Breast cancer Neg Hx        Social History Social History   Tobacco Use   Smoking status: Former    Types: Cigars   Smokeless tobacco: Never  Vaping Use   Vaping status: Never Used  Substance Use Topics   Alcohol use: Yes    Alcohol/week: 3.0 standard drinks of alcohol    Types: 3 Standard drinks or  equivalent per week    Comment: weekend   Drug use: No        ROS Full ROS of systems performed and is otherwise negative there than what is stated in the HPI  Physical Exam Blood pressure (!) 168/85, pulse 70, temperature 98.1 F (36.7 C), temperature source Oral, height 6' 1 (1.854 m), weight 238 lb 6.4 oz (108.1 kg), SpO2 97%.  Alert and oriented x 3, normal work of breathing room air, regular rate and rhythm, abdomen soft, nontender nondistended, breast exam performed in the presence of a chaperone.  Right breast there is no axillary lymphadenopathy and no masses palpated in the right breast.  In the left breast he has some bruising from his biopsy in the inferior outer quadrant.  Just deep to the nipple there is a  hard nodule that is not mobile.  This is nontender to palpation.  I do not appreciate any other masses or lesions.  I do not appreciate any left axillary lymphadenopathy.  Data Reviewed Mammogram reviewed and he has a left breast mass and biopsies consistent with invasive ductal carcinoma.  This is ER/PR positive HER2 negative  I have personally reviewed the patient's imaging and medical records.    Assessment    Patient with invasive ductal carcinoma of the left breast  Plan    I had a long discussion with the patient and his wife about the treatment options for breast cancer.  These mimic the treatment options for females.  I discussed the 2 surgical options including a breast conservation therapy with a lumpectomy and sentinel lymph node biopsy.  We discussed that generally if he chooses this then it would be recommended that he undergo postoperative radiation.  I also discussed the role of mastectomy with sentinel lymph node biopsy and depending on margin status as well as the invasiveness of the tumor he may or may not need postoperative radiation.  I discussed the survey of the lymph nodes that would determine staging as well as the need for postoperative chemotherapy.  I also discussed with him that if he opts to perform mastectomy then we could refer him for reconstruction.  He would like to undergo a mastectomy with sentinel lymph node biopsy.  He has deferred any discussion of reconstruction at this time.  I discussed the risk, benefits alternatives to the procedure including risk of infection, bleeding, poor cosmetic outcome, positive margins.  He understands these risk and wishes to proceed with surgery.  I also discussed the recommendation that he undergo 5 years of endocrine therapy postoperatively.  A total of 60 minutes was spent reviewing the patient's chart, performing a history and physical and discussing treatment options with the patient  Kurt Mendoza 12/30/2023, 2:44  PM

## 2023-12-30 NOTE — Patient Instructions (Signed)
 We have spoken today about breast surgery. Your Mastectomy will be scheduled at [facility] with Dr. Marinda.  If you are on any injectable weight loss medication, you will need to stop taking your GLP-1 injectable (weight loss) medications 8 days before your surgery to avoid any complications with anesthesia.   You will most likely go home the same day but may spend at least 1 night in the hospital following surgery and go home with 1-2 drains for approximately 5-7 days following your surgery. Please keep an accurate record of your drain amount in ml's or cc's. If your drain suddenly stops draining or has drainage around the tube at the skin, call our office and speak with a nurse immediately.  Please see the (blue) pre-care surgery sheet that you have been given today for more information regarding your surgery. Our surgery scheduler will call you to look at surgery dates and to go over information about your surgery.   If you have FMLA or Disability paperwork that needs to be filled out, please have your company fax your paperwork to [phone] or you may drop this by either office. This paperwork will be filled out within 3 days after your surgery has been completed.  Information regarding your surgery has been provided below. If you have any questions or concerns, please call our office and speak with a nurse.   Total or Modified Radical Mastectomy A total mastectomy and a modified radical mastectomy are types of surgery for breast cancer. If you are having a total mastectomy (simple mastectomy), your entire breast will be removed. If you are having a modified radical mastectomy, your breast and nipple will be removed along with the lymph nodes under your arm. You may also have some of the lining over the muscle tissues under your breast removed. LET [facility] CARE PROVIDER KNOW ABOUT: Any allergies you have. All medicines you are taking, including vitamins, herbs, eye drops, creams, and  over-the-counter medicines. Previous problems you or members of your family have had with the use of anesthetics. Any blood disorders you have. Previous surgeries you have had. Medical conditions you have. RISKS AND COMPLICATIONS Generally, this is a safe procedure. However, problems may occur, including: Pain. Infection. Bleeding. Scar tissue. Chest numbness on the side of the surgery. Fluid buildup under the skin flaps where your breast was removed (seroma). Sensation of throbbing or tingling. Stress or sadness from losing your breast. If you have the lymph nodes under your arm removed, you may have arm swelling, weakness, or numbness on the same side of your body as your surgery. BEFORE THE PROCEDURE Ask your health care provider about: Changing or stopping your regular medicines. This is especially important if you are taking diabetes medicines or blood thinners. Taking medicines such as aspirin and ibuprofen . These medicines can thin your blood. Do not take these medicines before your procedure if your health care provider instructs you not to. Follow your health care provider's instructions about eating or drinking restrictions. Plan to have someone take you home after the procedure. PROCEDURE An IV tube will be inserted into one of your veins. You will be given a medicine that makes you fall asleep (general anesthetic). Your breast will be cleaned with a germ-killing solution (antiseptic). A wide incision will be made around your nipple. The skin and nipple inside the incision will be removed along with all breast tissue. If you are having a modified radical mastectomy: The lining over your chest muscles will be removed.  The incision may be extended to reach the lymph nodes under your arm, or a second incision may be made. The lymph nodes will be removed. You may have a drainage tube inserted into your incision to collect fluid that builds up after surgery. This tube is  connected to a suction bulb. Your incision or incisions will be closed with stitches (sutures). A bandage (dressing) will be placed over your breast and under your arm. The procedure may vary among health care providers and hospitals. AFTER THE PROCEDURE You will be moved to a recovery area. Your blood pressure, heart rate, breathing rate, and blood oxygen level will be monitored often until the medicines you were given have worn off. You will be given pain medicine as needed. After a while, you will be taken to a hospital room. You will be encouraged to get up and walk as soon as you can. Your IV tube can be removed when you are able to eat and drink. Your drain may be removed before you go home from the hospital, or you may be sent home with your drain and suction bulb.   This information is not intended to replace advice given to you by your health care provider. Make sure you discuss any questions you have with your health care provider.   Document Released: 10/30/2000 Document Revised: 02/25/2014 Document Reviewed: 10/20/2013 Elsevier Interactive Patient Education 2016 [employer].   Breast Cancer in Males: What to Know Males have a small amount of breast tissue and can get breast cancer. Breast cancer is a malignant growth of tissue (tumor) in the breast. Unlike benign tumors, which aren't cancer, malignant tumors are cancer and can spread to other parts of the body. What are the causes? The cause of male breast cancer is not known. What increases the risk? These factors may make you more likely to develop breast cancer. Age. Most cases of male breast cancer occur in people who are in their 80s or 68s. Having a family history of breast cancer, or having the BRCA1 and BRCA2 genes. Having a history of radiation exposure. Having other medical conditions, such as: A genetic disorder called Klinefelter syndrome. Scarring on the liver. Certain problems that affect the  testicles. Other risk factors include: Taking medicines that contain estrogen. Being very overweight (obese). What are the signs or symptoms? Symptoms of breast cancer include: A painless lump in the breast. Changes in the size or shape of your breast. Breast skin changes, such as puckering or dimpling that looks like an orange peel. Sores on the skin. Nipple abnormalities, such as scaling, crustiness, redness, or pulling in. Nipple discharge that is bloody or clear. How is this diagnosed? Breast cancer may be diagnosed by: Your medical history. A physical exam. Your health care provider will feel the tissue around your breast and under your arms. Taking a sample of any nipple discharge. The sample will be examined under a microscope. Imaging tests, such as breast X-rays (mammogram), ultrasound, or MRI. Taking a tissue sample from the breast. The sample will be examined to look for cancer cells. Taking a sample from the lymph nodes near the affected breast. Your cancer will be staged after diagnosis to determine the size, location, and if it has spread in your body. This will help your cancer care team decide on a treatment that will work best for you. You may need to have more tests to determine the stage of your cancer. How is this treated? Depending on the type and  stage, breast cancer may be treated with one or more of these therapies: Surgery. The goal is to remove as much of the cancer as possible. This is done with a procedure to remove the whole breast and nipple. Some normal tissue surrounding this area may also be removed. This surgery may also involve removing lymph nodes in the area to be checked for cancer cells. Radiation therapy, which uses high-energy rays to kill cancer cells. Chemotherapy, which is the use of medicines to kill cancer cells. Hormone therapy, which involves taking medicine to adjust the hormone levels in your body. You may take medicine to decrease your  estrogen levels. This can help stop cancer cells from growing. Targeted therapy. There are medicines that are used to block the growth and the spread of cancer cells. These medicines target a specific part of the cancer cell and usually cause fewer side effects than chemotherapy. They may be used alone or in combination with chemotherapy. Immunotherapy, which is the use of medicines to boost the immune system to recognize and destroy cancer cells more effectively. A combination of surgery, radiation, chemotherapy, or hormone therapy may be needed to treat breast cancer. Follow these instructions at home:  Take your medicines only as told. Eat a healthy diet. A healthy diet includes lots of fruits and vegetables, low-fat dairy products, lean meats, and fiber. Make sure half your plate is filled with fruits or vegetables. Choose high-fiber foods such as whole-grain breads and cereals. Consider joining a support group. This may help you cope with the stress of having breast cancer. Talk to your health care team about exercise and physical activity. The right exercise program can: Help prevent or reduce symptoms such as tiredness or depression. Improve overall health and survival rates. Where to find more information American Cancer Society: https://www.cancer.org/cancer/breast-cancer-in-men.html National Cancer Institute: blackjackprogram.co.za Contact a health care provider if: You have a sudden increase in pain. You have any symptoms or changes that concern you. You notice a new lump in either breast or under your arm. You develop swelling in either arm or hand. You lose weight without trying. You have a fever. You notice new tiredness or weakness. Get help right away if: You have chest pain. You have trouble breathing. These symptoms may be an emergency. Call 911 right away. Do not wait to see if the symptoms will go away. Do not drive  yourself to the hospital. This information is not intended to replace advice given to you by your health care provider. Make sure you discuss any questions you have with your health care provider. Document Revised: 05/29/2023 Document Reviewed: 05/29/2023 Elsevier Patient Education  2025 [employer].

## 2023-12-31 ENCOUNTER — Other Ambulatory Visit: Payer: Self-pay

## 2023-12-31 ENCOUNTER — Encounter: Payer: Self-pay | Admitting: *Deleted

## 2023-12-31 MED ORDER — LIDOCAINE-PRILOCAINE 2.5-2.5 % EX CREA
TOPICAL_CREAM | CUTANEOUS | 0 refills | Status: AC
Start: 2023-12-31 — End: ?

## 2023-12-31 NOTE — Progress Notes (Addendum)
 Patient has been advised of Pre-Admission date/time, and Surgery date at [facility].  Surgery Date: 01/02/24 Preadmission Testing Date: 01/01/24 (phone 8A-1P)  Patient has been made aware to arrive at the hospital for surgery at 10:30 am and check in at the registration desk in the [facility].

## 2023-12-31 NOTE — Progress Notes (Signed)
 Mastectomy is scheduled for 11/14.   Kurt Mendoza will see Dr. Rennie back on 12/8.

## 2024-01-01 ENCOUNTER — Encounter
Admission: RE | Admit: 2024-01-01 | Discharge: 2024-01-01 | Disposition: A | Discharge status: Home / Self Care | Source: Ambulatory Visit | Attending: General Surgery | Admitting: General Surgery

## 2024-01-01 ENCOUNTER — Other Ambulatory Visit: Payer: Self-pay

## 2024-01-01 ENCOUNTER — Ambulatory Visit: Payer: Self-pay | Admitting: General Surgery

## 2024-01-01 DIAGNOSIS — I2510 Atherosclerotic heart disease of native coronary artery without angina pectoris: Secondary | ICD-10-CM

## 2024-01-01 DIAGNOSIS — Z01810 Encounter for preprocedural cardiovascular examination: Secondary | ICD-10-CM

## 2024-01-01 DIAGNOSIS — I700 Atherosclerosis of aorta: Secondary | ICD-10-CM

## 2024-01-01 DIAGNOSIS — C50922 Malignant neoplasm of unspecified site of left male breast: Secondary | ICD-10-CM

## 2024-01-01 DIAGNOSIS — C50822 Malignant neoplasm of overlapping sites of left male breast: Secondary | ICD-10-CM

## 2024-01-01 DIAGNOSIS — I10 Essential (primary) hypertension: Secondary | ICD-10-CM

## 2024-01-01 HISTORY — DX: Atherosclerotic heart disease of native coronary artery without angina pectoris: I25.10

## 2024-01-01 HISTORY — DX: Anxiety disorder, unspecified: F41.9

## 2024-01-01 HISTORY — DX: Drug-induced erectile dysfunction: N52.2

## 2024-01-01 HISTORY — DX: Malignant neoplasm of unspecified site of unspecified male breast: C50.929

## 2024-01-01 HISTORY — DX: Atherosclerosis of aorta: I70.0

## 2024-01-01 NOTE — Patient Instructions (Addendum)
 Your procedure is scheduled on:01-02-24 Friday Report to the Registration Desk on the 1st floor of the [facility]. Then proceed to the Radiology Desk (2nd desk on right) Arrive at 10:30 am   REMEMBER: Instructions that are not followed completely may result in serious medical risk, up to and including death; or upon the discretion of your surgeon and anesthesiologist your surgery may need to be rescheduled.  Do not eat food OR drink any liquids after midnight the night before surgery.  No gum chewing or hard candies.  One week prior to surgery:Stop NOW (01-01-24) Stop Anti-inflammatories (NSAIDS) such as Advil , Aleve, Ibuprofen , Motrin , Naproxen, Naprosyn and Aspirin based products such as Excedrin, Goody's Powder, BC Powder.  You may however, continue to take Tylenol if needed for pain up until the day of surgery  Continue taking all of your other prescription medications up until the day of surgery.  ON THE DAY OF SURGERY ONLY TAKE THESE MEDICATIONS WITH SIPS OF WATER: -allopurinol  (ZYLOPRIM )   No Alcohol for 24 hours before or after surgery.  No Smoking including e-cigarettes for 24 hours before surgery.  No chewable tobacco products for at least 6 hours before surgery.  No nicotine patches on the day of surgery.  Do not use any recreational drugs for at least a week (preferably 2 weeks) before your surgery.  Please be advised that the combination of cocaine and anesthesia may have negative outcomes, up to and including death. If you test positive for cocaine, your surgery will be cancelled.  On the morning of surgery brush your teeth with toothpaste and water, you may rinse your mouth with mouthwash if you wish. Do not swallow any toothpaste or mouthwash.  Do not wear jewelry, make-up, hairpins, clips or nail polish.  For welded (permanent) jewelry: bracelets, anklets, waist bands, etc.  Please have this removed prior to surgery.  If it is not removed, there is a chance  that hospital personnel will need to cut it off on the day of surgery.  Do not wear lotions, powders, or perfumes.   Do not shave body hair from the neck down 48 hours before surgery.  Contact lenses, hearing aids and dentures may not be worn into surgery.  Do not bring valuables to the hospital. [facility] is not responsible for any missing/lost belongings or valuables.    Notify your doctor if there is any change in your medical condition (cold, fever, infection).  Wear comfortable clothing (specific to your surgery type) to the hospital.  After surgery, you can help prevent lung complications by doing breathing exercises.  Take deep breaths and cough every 1-2 hours. Your doctor may order a device called an Incentive Spirometer to help you take deep breaths. When coughing or sneezing, hold a pillow firmly against your incision with both hands. This is called "splinting." Doing this helps protect your incision. It also decreases belly discomfort.  If you are being admitted to the hospital overnight, leave your suitcase in the car. After surgery it may be brought to your room.  In case of increased patient census, it may be necessary for you, the patient, to continue your postoperative care in the Same Day Surgery department.  If you are being discharged the day of surgery, you will not be allowed to drive home. You will need a responsible individual to drive you home and stay with you for 24 hours after surgery.   If you are taking public transportation, you will need to have a responsible  individual with you.  Please call the Pre-admissions Testing Dept. at [phone] if you have any questions about these instructions.  Surgery Visitation Policy:  Patients having surgery or a procedure may have two visitors.  Children under the age of 80 must have an adult with them who is not the patient.  Inpatient Visitation:    Visiting hours are 7 a.m. to 8 p.m. Up to four visitors  are allowed at one time in a patient room. The visitors may rotate out with other people during the day.  One visitor age 46 or older may stay with the patient overnight and must be in the room by 8 p.m.   Merchandiser, Retail to address health-related social needs:  [email or web address]

## 2024-01-01 NOTE — Progress Notes (Signed)
 Called pt do his anesthesia interview. Informed pt he needs an EKG prior to Mastectomy tomorrow. Pt states he is so busy with his job and trying to get everything done prior to surgery tomorrow that he will not be able to come in today. Informed pt EKG will be done in the morning prior to his surgery.

## 2024-01-02 ENCOUNTER — Encounter: Payer: Self-pay | Admitting: General Surgery

## 2024-01-02 ENCOUNTER — Encounter
Admission: RE | Disposition: A | Discharge status: Home / Self Care | Payer: Self-pay | Source: Home / Self Care | Attending: General Surgery

## 2024-01-02 ENCOUNTER — Observation Stay
Admission: RE | Admit: 2024-01-02 | Discharge: 2024-01-03 | Disposition: A | Discharge status: Home / Self Care | Attending: General Surgery | Admitting: General Surgery

## 2024-01-02 ENCOUNTER — Ambulatory Visit: Admitting: Anesthesiology

## 2024-01-02 ENCOUNTER — Other Ambulatory Visit: Payer: Self-pay

## 2024-01-02 ENCOUNTER — Ambulatory Visit
Admission: RE | Admit: 2024-01-02 | Discharge: 2024-01-02 | Disposition: A | Discharge status: Home / Self Care | Source: Ambulatory Visit | Attending: General Surgery | Admitting: General Surgery

## 2024-01-02 DIAGNOSIS — Z170 Estrogen receptor positive status [ER+]: Secondary | ICD-10-CM | POA: Diagnosis not present

## 2024-01-02 DIAGNOSIS — Z87891 Personal history of nicotine dependence: Secondary | ICD-10-CM | POA: Insufficient documentation

## 2024-01-02 DIAGNOSIS — I2510 Atherosclerotic heart disease of native coronary artery without angina pectoris: Secondary | ICD-10-CM | POA: Diagnosis not present

## 2024-01-02 DIAGNOSIS — I2584 Coronary atherosclerosis due to calcified coronary lesion: Secondary | ICD-10-CM | POA: Diagnosis not present

## 2024-01-02 DIAGNOSIS — I10 Essential (primary) hypertension: Secondary | ICD-10-CM | POA: Insufficient documentation

## 2024-01-02 DIAGNOSIS — I700 Atherosclerosis of aorta: Secondary | ICD-10-CM

## 2024-01-02 DIAGNOSIS — C50922 Malignant neoplasm of unspecified site of left male breast: Secondary | ICD-10-CM | POA: Diagnosis not present

## 2024-01-02 DIAGNOSIS — Z01810 Encounter for preprocedural cardiovascular examination: Secondary | ICD-10-CM

## 2024-01-02 DIAGNOSIS — C50822 Malignant neoplasm of overlapping sites of left male breast: Secondary | ICD-10-CM | POA: Diagnosis not present

## 2024-01-02 DIAGNOSIS — Z853 Personal history of malignant neoplasm of breast: Principal | ICD-10-CM

## 2024-01-02 HISTORY — PX: AXILLARY SENTINEL NODE BIOPSY: SHX5738

## 2024-01-02 HISTORY — PX: TOTAL MASTECTOMY: SHX6129

## 2024-01-02 SURGERY — MASTECTOMY, SIMPLE
Anesthesia: General | Laterality: Left

## 2024-01-02 MED ORDER — HYDROMORPHONE HCL 1 MG/ML IJ SOLN: 0.5000 mg | INTRAMUSCULAR | Status: DC | PRN

## 2024-01-02 MED ORDER — EPHEDRINE SULFATE-NACL 50-0.9 MG/10ML-% IV SOSY
PREFILLED_SYRINGE | INTRAVENOUS | Status: DC | PRN
  Administered 2024-01-02: 5 mg via INTRAVENOUS

## 2024-01-02 MED ORDER — HYDROMORPHONE HCL 1 MG/ML IJ SOLN
INTRAMUSCULAR | Status: AC
  Filled 2024-01-02: qty 1

## 2024-01-02 MED ORDER — OXYCODONE HCL 5 MG PO TABS: 5.0000 mg | ORAL_TABLET | Freq: Three times a day (TID) | ORAL | 0 refills | Status: DC | PRN

## 2024-01-02 MED ORDER — ONDANSETRON HCL 4 MG/2ML IJ SOLN
4.0000 mg | Freq: Four times a day (QID) | INTRAMUSCULAR | Status: DC | PRN
Start: 2024-01-02 — End: 2024-01-03

## 2024-01-02 MED ORDER — SUGAMMADEX SODIUM 200 MG/2ML IV SOLN
INTRAVENOUS | Status: DC | PRN
  Administered 2024-01-02: 200 mg via INTRAVENOUS

## 2024-01-02 MED ORDER — BUPIVACAINE-EPINEPHRINE (PF) 0.5% -1:200000 IJ SOLN
INTRAMUSCULAR | Status: DC | PRN
  Administered 2024-01-02: 15 mL via PERINEURAL

## 2024-01-02 MED ORDER — OXYCODONE HCL 5 MG/5ML PO SOLN: 5.0000 mg | Freq: Once | ORAL | Status: AC | PRN

## 2024-01-02 MED ORDER — BUPIVACAINE-EPINEPHRINE (PF) 0.5% -1:200000 IJ SOLN
INTRAMUSCULAR | Status: AC
  Filled 2024-01-02: qty 30

## 2024-01-02 MED ORDER — LIDOCAINE HCL (CARDIAC) PF 100 MG/5ML IV SOSY
PREFILLED_SYRINGE | INTRAVENOUS | Status: DC | PRN
Start: 2024-01-02 — End: 2024-01-02
  Administered 2024-01-02: 100 mg via INTRAVENOUS

## 2024-01-02 MED ORDER — STERILE WATER FOR IRRIGATION IR SOLN
Status: DC | PRN
  Administered 2024-01-02: 1000 mL

## 2024-01-02 MED ORDER — ONDANSETRON 4 MG PO TBDP: 4.0000 mg | ORAL_TABLET | Freq: Four times a day (QID) | ORAL | Status: DC | PRN

## 2024-01-02 MED ORDER — GLYCOPYRROLATE 0.2 MG/ML IJ SOLN
INTRAMUSCULAR | Status: DC | PRN
  Administered 2024-01-02: .2 mg via INTRAVENOUS

## 2024-01-02 MED ORDER — LACTATED RINGERS IV SOLN
INTRAVENOUS | Status: DC | PRN
Start: 2024-01-02 — End: 2024-01-02

## 2024-01-02 MED ORDER — MIDAZOLAM HCL (PF) 2 MG/2ML IJ SOLN
INTRAMUSCULAR | Status: DC | PRN
Start: 2024-01-02 — End: 2024-01-02
  Administered 2024-01-02: 2 mg via INTRAVENOUS

## 2024-01-02 MED ORDER — ONDANSETRON HCL 4 MG/2ML IJ SOLN
INTRAMUSCULAR | Status: DC | PRN
Start: 2024-01-02 — End: 2024-01-02
  Administered 2024-01-02: 4 mg via INTRAVENOUS

## 2024-01-02 MED ORDER — FENTANYL CITRATE (PF) 100 MCG/2ML IJ SOLN
INTRAMUSCULAR | Status: AC
  Filled 2024-01-02: qty 2

## 2024-01-02 MED ORDER — LOSARTAN POTASSIUM 50 MG PO TABS
50.0000 mg | ORAL_TABLET | ORAL | Status: DC
  Administered 2024-01-03: 50 mg via ORAL
  Filled 2024-01-02: qty 1

## 2024-01-02 MED ORDER — OXYCODONE HCL 5 MG PO TABS
5.0000 mg | ORAL_TABLET | Freq: Once | ORAL | Status: AC | PRN
  Administered 2024-01-02: 5 mg via ORAL

## 2024-01-02 MED ORDER — CHLORHEXIDINE GLUCONATE 0.12 % MT SOLN
15.0000 mL | Freq: Once | OROMUCOSAL | Status: AC
  Administered 2024-01-02: 15 mL via OROMUCOSAL

## 2024-01-02 MED ORDER — DEXAMETHASONE SOD PHOSPHATE PF 10 MG/ML IJ SOLN
INTRAMUSCULAR | Status: DC | PRN
  Administered 2024-01-02: 10 mg via INTRAVENOUS

## 2024-01-02 MED ORDER — ISOSULFAN BLUE 1 % ~~LOC~~ SOLN
SUBCUTANEOUS | Status: DC | PRN
  Administered 2024-01-02: 4 mL via SUBCUTANEOUS

## 2024-01-02 MED ORDER — SUCCINYLCHOLINE CHLORIDE 200 MG/10ML IV SOSY
PREFILLED_SYRINGE | INTRAVENOUS | Status: DC | PRN
  Administered 2024-01-02: 100 mg via INTRAVENOUS

## 2024-01-02 MED ORDER — FENTANYL CITRATE (PF) 100 MCG/2ML IJ SOLN
INTRAMUSCULAR | Status: DC | PRN
  Administered 2024-01-02 (×2): 100 ug via INTRAVENOUS

## 2024-01-02 MED ORDER — CELECOXIB 200 MG PO CAPS
200.0000 mg | ORAL_CAPSULE | Freq: Once | ORAL | Status: AC
  Administered 2024-01-02: 200 mg via ORAL

## 2024-01-02 MED ORDER — DROPERIDOL 2.5 MG/ML IJ SOLN: 0.6250 mg | Freq: Once | INTRAMUSCULAR | Status: DC | PRN

## 2024-01-02 MED ORDER — LACTATED RINGERS IV SOLN: INTRAVENOUS | Status: DC

## 2024-01-02 MED ORDER — TIZANIDINE HCL 4 MG PO TABS
4.0000 mg | ORAL_TABLET | Freq: Three times a day (TID) | ORAL | Status: DC | PRN
Start: 2024-01-02 — End: 2024-01-03
  Administered 2024-01-03: 4 mg via ORAL
  Filled 2024-01-02 (×2): qty 1

## 2024-01-02 MED ORDER — ALLOPURINOL 100 MG PO TABS
450.0000 mg | ORAL_TABLET | Freq: Every day | ORAL | Status: DC
  Filled 2024-01-02: qty 5

## 2024-01-02 MED ORDER — CHLORHEXIDINE GLUCONATE CLOTH 2 % EX PADS
6.0000 | MEDICATED_PAD | Freq: Once | CUTANEOUS | Status: AC
  Administered 2024-01-02: 6 via TOPICAL

## 2024-01-02 MED ORDER — TECHNETIUM TC 99M TILMANOCEPT KIT
1.1000 | PACK | Freq: Once | INTRAVENOUS | Status: AC | PRN
  Administered 2024-01-02: 1.1 via INTRADERMAL

## 2024-01-02 MED ORDER — FENTANYL CITRATE (PF) 250 MCG/5ML IJ SOLN
INTRAMUSCULAR | Status: AC
  Filled 2024-01-02: qty 5

## 2024-01-02 MED ORDER — ROCURONIUM BROMIDE 100 MG/10ML IV SOLN
INTRAVENOUS | Status: DC | PRN
Start: 2024-01-02 — End: 2024-01-02
  Administered 2024-01-02: 30 mg via INTRAVENOUS
  Administered 2024-01-02: 10 mg via INTRAVENOUS
  Administered 2024-01-02 (×2): 30 mg via INTRAVENOUS

## 2024-01-02 MED ORDER — CEFAZOLIN SODIUM-DEXTROSE 2-4 GM/100ML-% IV SOLN
2.0000 g | INTRAVENOUS | Status: AC
  Administered 2024-01-02: 2 g via INTRAVENOUS

## 2024-01-02 MED ORDER — OXYCODONE HCL 5 MG PO TABS: 5.0000 mg | ORAL_TABLET | ORAL | Status: DC | PRN

## 2024-01-02 MED ORDER — CHLORHEXIDINE GLUCONATE 0.12 % MT SOLN
OROMUCOSAL | Status: AC
  Filled 2024-01-02: qty 15

## 2024-01-02 MED ORDER — CEFAZOLIN SODIUM-DEXTROSE 2-4 GM/100ML-% IV SOLN
INTRAVENOUS | Status: AC
  Filled 2024-01-02: qty 100

## 2024-01-02 MED ORDER — ACETAMINOPHEN 500 MG PO TABS
1000.0000 mg | ORAL_TABLET | Freq: Three times a day (TID) | ORAL | Status: DC
  Administered 2024-01-02 – 2024-01-03 (×2): 1000 mg via ORAL
  Filled 2024-01-02 (×3): qty 2

## 2024-01-02 MED ORDER — FENTANYL CITRATE (PF) 100 MCG/2ML IJ SOLN
25.0000 ug | INTRAMUSCULAR | Status: DC | PRN
  Administered 2024-01-02 (×4): 25 ug via INTRAVENOUS

## 2024-01-02 MED ORDER — ISOSULFAN BLUE 1 % ~~LOC~~ SOLN
SUBCUTANEOUS | Status: AC
  Filled 2024-01-02: qty 5

## 2024-01-02 MED ORDER — PROPOFOL 10 MG/ML IV BOLUS
INTRAVENOUS | Status: DC | PRN
  Administered 2024-01-02: 200 mg via INTRAVENOUS

## 2024-01-02 MED ORDER — ACETAMINOPHEN 500 MG PO TABS
1000.0000 mg | ORAL_TABLET | Freq: Once | ORAL | Status: AC
  Administered 2024-01-02: 1000 mg via ORAL

## 2024-01-02 MED ORDER — ORAL CARE MOUTH RINSE: 15.0000 mL | Freq: Once | OROMUCOSAL | Status: AC

## 2024-01-02 MED ORDER — OXYCODONE HCL 5 MG PO TABS
ORAL_TABLET | ORAL | Status: AC
  Filled 2024-01-02: qty 1

## 2024-01-02 MED ORDER — PROPOFOL 10 MG/ML IV BOLUS
INTRAVENOUS | Status: AC
  Filled 2024-01-02: qty 20

## 2024-01-02 MED ORDER — DEXMEDETOMIDINE HCL IN NACL 200 MCG/50ML IV SOLN
INTRAVENOUS | Status: DC | PRN
  Administered 2024-01-02 (×3): 12 ug via INTRAVENOUS

## 2024-01-02 MED ORDER — CHLORHEXIDINE GLUCONATE CLOTH 2 % EX PADS: 6.0000 | MEDICATED_PAD | Freq: Once | CUTANEOUS | Status: DC

## 2024-01-02 MED ORDER — ACETAMINOPHEN 10 MG/ML IV SOLN: 1000.0000 mg | Freq: Once | INTRAVENOUS | Status: DC | PRN

## 2024-01-02 MED ORDER — CELECOXIB 200 MG PO CAPS
ORAL_CAPSULE | ORAL | Status: AC
  Filled 2024-01-02: qty 1

## 2024-01-02 MED ORDER — PHENYLEPHRINE 80 MCG/ML (10ML) SYRINGE FOR IV PUSH (FOR BLOOD PRESSURE SUPPORT)
PREFILLED_SYRINGE | INTRAVENOUS | Status: DC | PRN
  Administered 2024-01-02: 80 ug via INTRAVENOUS

## 2024-01-02 MED ORDER — ACETAMINOPHEN 500 MG PO TABS
ORAL_TABLET | ORAL | Status: AC
  Filled 2024-01-02: qty 2

## 2024-01-02 MED ORDER — CEFAZOLIN SODIUM-DEXTROSE 1-4 GM/50ML-% IV SOLN
1.0000 g | Freq: Three times a day (TID) | INTRAVENOUS | Status: DC
Start: 2024-01-02 — End: 2024-01-03
  Administered 2024-01-02: 1 g via INTRAVENOUS
  Filled 2024-01-02 (×3): qty 50

## 2024-01-02 MED ORDER — MIDAZOLAM HCL 2 MG/2ML IJ SOLN
INTRAMUSCULAR | Status: AC
  Filled 2024-01-02: qty 2

## 2024-01-02 SURGICAL SUPPLY — 34 items
BINDER BREAST BLACK XL (GAUZE/BANDAGES/DRESSINGS) IMPLANT
CHLORAPREP W/TINT 26 (MISCELLANEOUS) ×1 IMPLANT
CLIP APPLIE 11 MED OPEN (CLIP) IMPLANT
COVER PROBE GAMMA FINDER SLV (MISCELLANEOUS) ×1 IMPLANT
DERMABOND ADVANCED .7 DNX12 (GAUZE/BANDAGES/DRESSINGS) ×2 IMPLANT
DRSG GAUZE FLUFF 36X18 (GAUZE/BANDAGES/DRESSINGS) ×1 IMPLANT
DRSG TEGADERM 4X4.75 (GAUZE/BANDAGES/DRESSINGS) IMPLANT
ELECTRODE REM PT RTRN 9FT ADLT (ELECTROSURGICAL) ×1 IMPLANT
EVACUATOR DRAINAGE 7X20 100CC (MISCELLANEOUS) IMPLANT
EVACUATOR SILICONE 100CC (DRAIN) IMPLANT
GAUZE 4X4 16PLY ~~LOC~~+RFID DBL (SPONGE) IMPLANT
GLOVE BIOGEL PI IND STRL 7.5 (GLOVE) ×1 IMPLANT
GLOVE SURG SYN 7.0 PF PI (GLOVE) ×1 IMPLANT
GOWN STRL REUS W/ TWL LRG LVL3 (GOWN DISPOSABLE) ×2 IMPLANT
KIT TURNOVER KIT A (KITS) ×1 IMPLANT
LABEL OR SOLS (LABEL) ×1 IMPLANT
MANIFOLD NEPTUNE II (INSTRUMENTS) ×1 IMPLANT
NDL FILTER BLUNT 18X1 1/2 (NEEDLE) ×1 IMPLANT
NDL HYPO 22X1.5 SAFETY MO (MISCELLANEOUS) ×2 IMPLANT
NDL SAFETY ECLIP 18X1.5 (MISCELLANEOUS) ×1 IMPLANT
NEEDLE FILTER BLUNT 18X1 1/2 (NEEDLE) ×1 IMPLANT
NEEDLE HYPO 22X1.5 SAFETY MO (MISCELLANEOUS) ×2 IMPLANT
PACK BASIN MAJOR ARMC (MISCELLANEOUS) ×1 IMPLANT
PACK UNIVERSAL (MISCELLANEOUS) ×1 IMPLANT
SPONGE DRAIN TRACH 4X4 STRL 2S (GAUZE/BANDAGES/DRESSINGS) IMPLANT
SPONGE T-LAP 18X18 ~~LOC~~+RFID (SPONGE) IMPLANT
SUT ETH BLK MONO 3 0 FS 1 12/B (SUTURE) IMPLANT
SUT SILK 2 0 SH (SUTURE) IMPLANT
SUT SILK 3 0 SH 30 (SUTURE) IMPLANT
SUT VICRYL 3-0 CR8 SH (SUTURE) ×1 IMPLANT
SUTURE MNCRL 4-0 27XMF (SUTURE) ×1 IMPLANT
SYR 10ML LL (SYRINGE) ×1 IMPLANT
TRAP FLUID SMOKE EVACUATOR (MISCELLANEOUS) ×1 IMPLANT
WATER STERILE IRR 500ML POUR (IV SOLUTION) ×1 IMPLANT

## 2024-01-02 NOTE — Anesthesia Preprocedure Evaluation (Addendum)
 Anesthesia Evaluation  Patient identified by MRN, date of birth, ID band Patient awake    Reviewed: Allergy & Precautions, H&P , NPO status , Patient's Chart, lab work & pertinent test results  Airway Mallampati: II  TM Distance: >3 FB Neck ROM: full    Dental no notable dental hx.    Pulmonary former smoker   Pulmonary exam normal        Cardiovascular hypertension, Normal cardiovascular exam  Coronary artery calcification seen on CT scan   Neuro/Psych  PSYCHIATRIC DISORDERS Anxiety     negative neurological ROS     GI/Hepatic negative GI ROS, Neg liver ROS,,,  Endo/Other  negative endocrine ROS    Renal/GU      Musculoskeletal  (+) Arthritis ,    Abdominal  (+) + obese  Peds  Hematology negative hematology ROS (+)   Anesthesia Other Findings Past Medical History: No date: Anxiety No date: Aortic atherosclerosis No date: Breast cancer in male [facility]) No date: Coronary artery calcification seen on CT scan 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 No date: Esophagitis No date: Gout No date: History of irregular heartbeat No date: Hypertension  Past Surgical History: 1997: BACK SURGERY 12/22/2023: BREAST BIOPSY; Left     Comment:  US  LT BREAST BX W LOC DEV 1ST LESION IMG BX SPEC US                GUIDE 12/22/2023 [facility] 11/09/2021: COLONOSCOPY WITH PROPOFOL ; N/A     Comment:  Procedure: COLONOSCOPY WITH PROPOFOL ;  Surgeon: Therisa Bi, MD;  Location: [facility] ENDOSCOPY;  Service:               Gastroenterology;  Laterality: N/A; 2014: KNEE SURGERY 02/02/2018: LAPAROSCOPIC APPENDECTOMY; N/A     Comment:  Procedure: APPENDECTOMY LAPAROSCOPIC;  Surgeon:               Rodolph Romano, MD;  Location: [facility] ORS;  Service:              General;  Laterality: N/A;     Reproductive/Obstetrics negative OB ROS                              Anesthesia  Physical Anesthesia Plan  ASA: 2  Anesthesia Plan: General   Post-op Pain Management: Tylenol PO (pre-op)* and Celebrex PO (pre-op)*   Induction: Intravenous  PONV Risk Score and Plan: Dexamethasone , Ondansetron , Midazolam  and Treatment may vary due to age or medical condition  Airway Management Planned: Oral ETT  Additional Equipment:   Intra-op Plan:   Post-operative Plan: Extubation in OR  Informed Consent: I have reviewed the patients History and Physical, chart, labs and discussed the procedure including the risks, benefits and alternatives for the proposed anesthesia with the patient or authorized representative who has indicated his/her understanding and acceptance.     Dental Advisory Given  Plan Discussed with: Anesthesiologist, CRNA and Surgeon  Anesthesia Plan Comments:          Anesthesia Quick Evaluation

## 2024-01-02 NOTE — Anesthesia Postprocedure Evaluation (Signed)
 Anesthesia Post Note  Patient: Kurt Mendoza  Procedure(s) Performed: MASTECTOMY, SIMPLE (Left) BIOPSY, LYMPH NODE, SENTINEL, AXILLARY (Left)  Patient location during evaluation: PACU Anesthesia Type: General Level of consciousness: awake and alert Pain management: pain level controlled Vital Signs Assessment: post-procedure vital signs reviewed and stable Respiratory status: spontaneous breathing, nonlabored ventilation and respiratory function stable Cardiovascular status: blood pressure returned to baseline and stable Postop Assessment: no apparent nausea or vomiting Anesthetic complications: no   No notable events documented.   Last Vitals:  Vitals:   01/02/24 1600 01/02/24 1615  BP: 127/63 124/68  Pulse: 66 65  Resp: 14 13  Temp:  (!) 36.3 C  SpO2: 99% 98%    Last Pain:  Vitals:   01/02/24 1730  TempSrc:   PainSc: 3                  Kurt Mendoza

## 2024-01-02 NOTE — Op Note (Signed)
 Procedure Date:  01/02/2024  Pre-operative Diagnosis:  Left Breast Cancer  Post-operative Diagnosis: Left Breast Cancer  Procedure:  Left simple mastectomy and sentinel lymph node biopsy  Surgeon:  Kurt Endow, MD  Anesthesia:  General endotracheal  Estimated Blood Loss:  50 ml  Specimens:  Left Breast, Left breast sentinel lymph node #1, left breast sentinel lymph node # 2  Complications:  None  Indications for Procedure:  This is a 63 y.o. male who presents with left breast cancer.  The risks of bleeding, infection, injury to surrounding structures, hematoma, seroma, open wound, cosmetic deformity, and the need for further surgery were all discussed with the patient and was willing to proceed.  Prior to this procedure, the patient had undergone sentinel lymphoscintigraphy.  Description of Procedure: The patient was correctly identified in the preoperative area and brought into the operating room.  The patient was placed supine with VTE prophylaxis in place.  Appropriate time-outs were performed.  Anesthesia was induced and the patient was intubated.  Appropriate antibiotics were infused.  A visual dye was injected in the left periareolar region under aseptic conditions, massage administered for 5 minutes. The left chest and axilla were prepped and draped in usual sterile fashion.   An elliptical incision was then copmpleted over the breast encompassing the nipple-areolar complex, with attention to the lesion of which we are concerned.  Using electrocautery, subcutaneous flaps were created superiorly to the clavicle, inferiorly to the inframammary fold, medially to the sternum, and laterally to the latissimus dorsi with careful attention to create flaps of adequate thickness.  The breast tissue was then dissected with the pectoralis fascia as the deep margin.  2-0 silk suture was used to mark the specimen as short superior and long lateral.  The cavity was then irrigated and hemostasis  was assured with electrocautery.     Then using the hand-held probe an area of high counts was identified in the axilla.  The clavopectoral fascia was openeed and the hand-held probe was used to guide dissection. A hot and blue lymph node was identified and resected.  This had a count of 190.  Additional lymph nodes were identified and resected with counts of 145.  The wound bed had a count of 10.  The cavity was irrigated and hemostasis was assured with electrocautery.   60 ml of Exparel  solution was infiltrated into the skin and subcutaneous tissue of the cavity.  2 15 Fr Blake drains were placed via lateral stab incision to drain the mastectomy wound bed and axilla .  The incision was then closed in two layers with 3-0 Vicryl and 4-0 Monocryl and sealed with DermaBond.  The wound was dressed with fluff gauze and a surgical bra was placed.  The patient was emerged from anesthesia and extubated and brought to the recovery room for further management.  The patient tolerated the procedure well and all counts were correct at the end of the case.  Sentinel Node Biopsy Synoptic Operative Report  Operation performed with curative intent:Yes  Tracer(s) used to identify sentinel nodes in the upfront surgery (non-neoadjuvant) setting (select all that apply):Dye and Radioactive Tracer  Tracer(s) used to identify sentinel nodes in the neoadjuvant setting (select all that apply):N/A  All nodes (colored or non-colored) present at the end of a dye-filled lymphatic channel were removed:Yes   All significantly radioactive nodes were removed:Yes  All palpable suspicious nodes were removed:N/A  Biopsy-proven positive nodes marked with clips prior to chemotherapy were identified and removed:N/A  Kurt Mendoza Marinda CHRISTELLA.D

## 2024-01-02 NOTE — Plan of Care (Signed)

## 2024-01-02 NOTE — Transfer of Care (Signed)
 Immediate Anesthesia Transfer of Care Note  Patient: Kurt Mendoza  Procedure(s) Performed: MASTECTOMY, SIMPLE (Left) BIOPSY, LYMPH NODE, SENTINEL, AXILLARY (Left)  Patient Location: PACU  Anesthesia Type:General  Level of Consciousness: awake, drowsy, and patient cooperative  Airway & Oxygen Therapy: Patient Spontanous Breathing and Patient connected to face mask oxygen  Post-op Assessment: Report given to RN and Post -op Vital signs reviewed and stable  Post vital signs: Reviewed and stable  Last Vitals:  Vitals Value Taken Time  BP 133/76 01/02/24 14:53  Temp 36.6 C 01/02/24 14:53  Pulse 80 01/02/24 14:58  Resp 33 01/02/24 14:58  SpO2 100 % 01/02/24 14:58  Vitals shown include unfiled device data.  Last Pain:  Vitals:   01/02/24 1453  TempSrc:   PainSc: Asleep         Complications: No notable events documented.

## 2024-01-02 NOTE — Discharge Instructions (Signed)
 You have surgical glue over your incision.  When you are at home you may leave this uncovered with a T-shirt on.  If you are out of the house please place gauze over the incision and either a tight fitting shirt are the provided surgical binder.  Please strip and record drains daily.  You may use Tylenol and ibuprofen  as needed for pain.  You may also place an ice pack over your incisions.  Please refrain from lifting overhead completely for 1 week.  Please refrain from lifting greater than 10 to 15 pounds for 4 weeks.  He will follow-up in the office in 1 week.  Please call our office with any questions or concerns.

## 2024-01-02 NOTE — Anesthesia Procedure Notes (Signed)
 Procedure Name: Intubation Date/Time: 01/02/2024 12:12 PM  Performed by: Ledora Duncan, CRNAPre-anesthesia Checklist: Patient identified, Emergency Drugs available, Suction available and Patient being monitored Patient Re-evaluated:Patient Re-evaluated prior to induction Oxygen Delivery Method: Circle system utilized Preoxygenation: Pre-oxygenation with 100% oxygen Induction Type: IV induction Ventilation: Mask ventilation without difficulty and Oral airway inserted - appropriate to patient size Tube type: Oral Tube size: 7.0 mm Number of attempts: 1 Airway Equipment and Method: Stylet, Oral airway and Video-laryngoscopy Placement Confirmation: ETT inserted through vocal cords under direct vision, positive ETCO2 and breath sounds checked- equal and bilateral Secured at: 22 cm Tube secured with: Tape Dental Injury: Teeth and Oropharynx as per pre-operative assessment

## 2024-01-02 NOTE — H&P (Signed)
 Patient has had nuclear medicine injection, no changes to below H and P, proceed as planned  C: Left Breast Cancer History of Present Illness Kurt Mendoza is a 63 y.o. male with past medical history of hypertension who presents in consultation for left breast cancer.  The patient reports that several weeks ago he hit his left chest and then developed some burning pain.  He went to his primary care physician who was concerned that it was shingles.  However the pain persisted and he noticed a lump in his left breast.  He had an ultrasound and mammogram that was concerning for mass.  He subsequently underwent a biopsy of this mass that was consistent with invasive ductal carcinoma.  The patient reports no other overlying skin changes and denies any drainage from his nipple.  He denies any history of breast cancer in his family.  He does have a mother that has leukemia and had a brother that died of lung cancer recently.SABRA   He is a non-smoker although occasionally will smoke cigars.  He does not drink every day.  He works a office manager.   Past Medical History     Past Medical History:  Diagnosis Date   Esophagitis     Gout     History of irregular heartbeat     Hypertension                   Past Surgical History:  Procedure Laterality Date   BACK SURGERY   1997   BREAST BIOPSY Left 12/22/2023    US  LT BREAST BX W LOC DEV 1ST LESION IMG BX SPEC US  GUIDE 12/22/2023 [facility]   COLONOSCOPY WITH PROPOFOL  N/A 11/09/2021    Procedure: COLONOSCOPY WITH PROPOFOL ;  Surgeon: Therisa Bi, MD;  Location: [facility] ENDOSCOPY;  Service: Gastroenterology;  Laterality: N/A;   KNEE SURGERY   2014   LAPAROSCOPIC APPENDECTOMY N/A 02/02/2018    Procedure: APPENDECTOMY LAPAROSCOPIC;  Surgeon: Rodolph Romano, MD;  Location: [facility] ORS;  Service: General;  Laterality: N/A;          Allergies      Allergies  Allergen Reactions   Amlodipine Swelling   Morphine And Codeine Itching               Current Outpatient Medications  Medication Sig Dispense Refill   allopurinol  (ZYLOPRIM ) 300 MG tablet TAKE 1 AND 1/2 TABLETS(450 MG) BY MOUTH DAILY 135 tablet 3   ibuprofen  (ADVIL ) 800 MG tablet Take 1 tablet (800 mg total) by mouth every 8 (eight) hours as needed. 30 tablet 0   losartan  (COZAAR ) 50 MG tablet Take 1 tablet (50 mg total) by mouth daily. 90 tablet 3   Spacer/Aero-Holding Chambers (AEROCHAMBER MV) inhaler Use as instructed 1 each 2   tiZANidine  (ZANAFLEX ) 4 MG tablet Take 1 tablet (4 mg total) by mouth every 8 (eight) hours as needed for muscle spasms. 90 tablet 3      No current facility-administered medications for this visit.        Family History      Family History  Problem Relation Age of Onset   Hyperlipidemia Mother     Leukemia Mother     Stroke Father     Hyperlipidemia Father     Diabetes Father     Heart disease Father     Thyroid disease Sister     Thyroid disease Brother     Lung cancer Brother     Liver cancer Brother  Breast cancer Neg Hx              Social History Social History  Social History         Tobacco Use   Smoking status: Former      Types: Cigars   Smokeless tobacco: Never  Vaping Use   Vaping status: Never Used  Substance Use Topics   Alcohol use: Yes      Alcohol/week: 3.0 standard drinks of alcohol      Types: 3 Standard drinks or equivalent per week      Comment: weekend   Drug use: No            ROS Full ROS of systems performed and is otherwise negative there than what is stated in the HPI   Physical Exam Blood pressure (!) 168/85, pulse 70, temperature 98.1 F (36.7 C), temperature source Oral, height 6' 1 (1.854 m), weight 238 lb 6.4 oz (108.1 kg), SpO2 97%.   Alert and oriented x 3, normal work of breathing room air, regular rate and rhythm, abdomen soft, nontender nondistended, breast exam performed in the presence of a chaperone.  Right breast there is no axillary lymphadenopathy and no masses  palpated in the right breast.  In the left breast he has some bruising from his biopsy in the inferior outer quadrant.  Just deep to the nipple there is a hard nodule that is not mobile.  This is nontender to palpation.  I do not appreciate any other masses or lesions.  I do not appreciate any left axillary lymphadenopathy.   Data Reviewed Mammogram reviewed and he has a left breast mass and biopsies consistent with invasive ductal carcinoma.  This is ER/PR positive HER2 negative   I have personally reviewed the patient's imaging and medical records.     Assessment Assessment Patient with invasive ductal carcinoma of the left breast   Plan Plan I had a long discussion with the patient and his wife about the treatment options for breast cancer.  These mimic the treatment options for females.  I discussed the 2 surgical options including a breast conservation therapy with a lumpectomy and sentinel lymph node biopsy.  We discussed that generally if he chooses this then it would be recommended that he undergo postoperative radiation.  I also discussed the role of mastectomy with sentinel lymph node biopsy and depending on margin status as well as the invasiveness of the tumor he may or may not need postoperative radiation.  I discussed the survey of the lymph nodes that would determine staging as well as the need for postoperative chemotherapy.  I also discussed with him that if he opts to perform mastectomy then we could refer him for reconstruction.  He would like to undergo a mastectomy with sentinel lymph node biopsy.  He has deferred any discussion of reconstruction at this time.  I discussed the risk, benefits alternatives to the procedure including risk of infection, bleeding, poor cosmetic outcome, positive margins.  He understands these risk and wishes to proceed with surgery.   I also discussed the recommendation that he undergo 5 years of endocrine therapy postoperatively.   A total of 60  minutes was spent reviewing the patient's chart, performing a history and physical and discussing treatment options with the patient   Kurt Mendoza 12/30/2023, 2:44 PM

## 2024-01-03 ENCOUNTER — Other Ambulatory Visit: Payer: Self-pay | Admitting: Family Medicine

## 2024-01-03 ENCOUNTER — Encounter: Payer: Self-pay | Admitting: General Surgery

## 2024-01-03 DIAGNOSIS — C50922 Malignant neoplasm of unspecified site of left male breast: Secondary | ICD-10-CM | POA: Diagnosis not present

## 2024-01-03 DIAGNOSIS — Z170 Estrogen receptor positive status [ER+]: Secondary | ICD-10-CM | POA: Diagnosis not present

## 2024-01-03 DIAGNOSIS — Z87891 Personal history of nicotine dependence: Secondary | ICD-10-CM | POA: Diagnosis not present

## 2024-01-03 DIAGNOSIS — I10 Essential (primary) hypertension: Secondary | ICD-10-CM | POA: Diagnosis not present

## 2024-01-03 DIAGNOSIS — M1A00X0 Idiopathic chronic gout, unspecified site, without tophus (tophi): Secondary | ICD-10-CM

## 2024-01-03 NOTE — Plan of Care (Signed)
   Problem: Activity: Goal: Risk for activity intolerance will decrease Outcome: Progressing   Problem: Nutrition: Goal: Adequate nutrition will be maintained Outcome: Progressing   Problem: Pain Managment: Goal: General experience of comfort will improve and/or be controlled Outcome: Progressing   Problem: Safety: Goal: Ability to remain free from injury will improve Outcome: Progressing

## 2024-01-03 NOTE — Discharge Summary (Signed)
 Patient ID: Kurt Mendoza MRN: [id number] DOB/AGE: April 06, 1960 63 y.o.  Admit date: 01/02/2024 Discharge date: 01/03/2024   Discharge Diagnoses:  Principal Problem:   History of left breast cancer   Procedures: Left simple mastectomy and sentinel lymph node biopsy  Hospital Course: Patient admitted with left breast malignant neoplasm  He was admitted for surgical management with a left breast total mastectomy with sentinel node biopsy.  This morning patient with pain control.  Soreness on left chest.  No pain radiation.  Pain alleviated with pain medications.  No specific aggravating factors.  Adequate drain output, serosanguineous.  Physical Exam Vitals reviewed.  HENT:     Head: Normocephalic.  Cardiovascular:     Rate and Rhythm: Normal rate and regular rhythm.     Pulses: Normal pulses.  Pulmonary:     Effort: Pulmonary effort is normal.     Breath sounds: Normal breath sounds.  Chest:  Breasts:    Right: Normal.     Left: Absent. No bleeding.     Comments: Skin flap without bruising or ischemic tissue.  No significant fluid collection. Abdominal:     General: Abdomen is flat. There is no distension.     Palpations: Abdomen is soft.  Musculoskeletal:        General: Normal range of motion.     Cervical back: Normal range of motion.  Skin:    General: Skin is warm.     Capillary Refill: Capillary refill takes less than 2 seconds.  Neurological:     Mental Status: He is alert and oriented to person, place, and time.      Consults: None  Disposition: Discharge disposition: 01-Home or Self Care       Discharge Instructions     Call MD for:  difficulty breathing, headache or visual disturbances   Complete by: As directed    Call MD for:  extreme fatigue   Complete by: As directed    Call MD for:  hives   Complete by: As directed    Call MD for:  persistant dizziness or light-headedness   Complete by: As directed    Call MD for:  persistant nausea and  vomiting   Complete by: As directed    Call MD for:  redness, tenderness, or signs of infection (pain, swelling, redness, odor or green/yellow discharge around incision site)   Complete by: As directed    Call MD for:  severe uncontrolled pain   Complete by: As directed    Call MD for:  temperature >100.4   Complete by: As directed    Diet - low sodium heart healthy   Complete by: As directed    Diet - low sodium heart healthy   Complete by: As directed    Increase activity slowly   Complete by: As directed    Increase activity slowly   Complete by: As directed       Allergies as of 01/03/2024       Reactions   Amlodipine Swelling   Morphine And Codeine Itching        Medication List     TAKE these medications    AeroChamber MV inhaler Use as instructed   allopurinol  300 MG tablet Commonly known as: ZYLOPRIM  TAKE 1 AND 1/2 TABLETS(450 MG) BY MOUTH DAILY   FISH OIL PO Take 2 tablets by mouth every morning.   ibuprofen  800 MG tablet Commonly known as: ADVIL  Take 1 tablet (800 mg total) by mouth every 8 (eight) hours  as needed.   lidocaine -prilocaine cream Commonly known as: EMLA Apply to the areola of left breast and cover with plastic wrap one hour prior to leaving for surgery.   losartan  50 MG tablet Commonly known as: COZAAR  Take 1 tablet (50 mg total) by mouth daily. What changed: when to take this   oxyCODONE 5 MG immediate release tablet Commonly known as: Roxicodone Take 1 tablet (5 mg total) by mouth every 8 (eight) hours as needed.   tiZANidine  4 MG tablet Commonly known as: ZANAFLEX  Take 1 tablet (4 mg total) by mouth every 8 (eight) hours as needed for muscle spasms.   VITAMIN D PO Take 1 tablet by mouth every morning.        This discharge encounter was for today 35-minute most of the time counseling the patient and coordinating plan of care.

## 2024-01-06 ENCOUNTER — Telehealth: Payer: Self-pay | Admitting: General Surgery

## 2024-01-06 ENCOUNTER — Other Ambulatory Visit: Payer: Self-pay | Admitting: Pathology

## 2024-01-06 LAB — SURGICAL PATHOLOGY

## 2024-01-06 NOTE — Telephone Encounter (Signed)
 Pt is asking for a call back from one of the nurses at [phone]. Would not say what it was about just asking for a call back.

## 2024-01-06 NOTE — Telephone Encounter (Signed)
 Too soon for refill, 10/24/23 for 90 and 1 RF.  Requested Prescriptions  Pending Prescriptions Disp Refills   allopurinol  (ZYLOPRIM ) 300 MG tablet [Pharmacy Med Name: ALLOPURINOL  300MG  TABLETS] 135 tablet 3    Sig: TAKE 1 AND 1/2 TABLETS(450 MG) BY MOUTH DAILY     Endocrinology:  Gout Agents - allopurinol  Failed - 01/06/2024 11:01 AM      Failed - Cr in normal range and within 360 days    Creat  Date Value Ref Range Status  10/17/2023 0.65 (L) 0.70 - 1.35 mg/dL Final         Passed - Uric Acid in normal range and within 360 days    Uric Acid, Serum  Date Value Ref Range Status  10/17/2023 4.4 4.0 - 8.0 mg/dL Final    Comment:    Therapeutic target for gout patients: <6.0 mg/dL .    Uric Acid  Date Value Ref Range Status  03/02/2019 8.8 (H) 3.8 - 8.4 mg/dL Final    Comment:               Therapeutic target for gout patients: <6.0         Passed - Valid encounter within last 12 months    Recent Outpatient Visits           3 weeks ago Pain of left breast   [facility] [place], Marsa PARAS, DO   2 months ago Annual physical exam   [place] [facility] Marsa PARAS, DO   8 months ago Abnormal weight   [place] [facility] [place], Marsa PARAS, DO              Passed - CBC within normal limits and completed in the last 12 months    WBC  Date Value Ref Range Status  10/17/2023 6.3 3.8 - 10.8 Thousand/uL Final   RBC  Date Value Ref Range Status  10/17/2023 4.98 4.20 - 5.80 Million/uL Final   Hemoglobin  Date Value Ref Range Status  10/17/2023 15.9 13.2 - 17.1 g/dL Final   HCT  Date Value Ref Range Status  10/17/2023 46.7 38.5 - 50.0 % Final   MCHC  Date Value Ref Range Status  10/17/2023 34.0 32.0 - 36.0 g/dL Final    Comment:    For adults, a slight decrease in the calculated MCHC value (in the range of 30 to 32 g/dL) is most likely not clinically significant; however, it  should be interpreted with caution in correlation with other red cell parameters and the patient's clinical condition.    [facility]  Date Value Ref Range Status  10/17/2023 31.9 27.0 - 33.0 pg Final   MCV  Date Value Ref Range Status  10/17/2023 93.8 80.0 - 100.0 fL Final   No results found for: PLTCOUNTKUC, LABPLAT, POCPLA RDW  Date Value Ref Range Status  10/17/2023 13.1 11.0 - 15.0 % Final

## 2024-01-06 NOTE — Telephone Encounter (Signed)
 Spoke with the patient and reviewed wound care and information about changing the dressings over his wound. All questions answered. He will call back with any further questions.

## 2024-01-07 ENCOUNTER — Encounter: Payer: Self-pay | Admitting: *Deleted

## 2024-01-07 NOTE — Progress Notes (Signed)
 Oncotype Dx order [id number] submitted online

## 2024-01-08 ENCOUNTER — Encounter: Payer: Self-pay | Admitting: General Surgery

## 2024-01-08 ENCOUNTER — Ambulatory Visit (INDEPENDENT_AMBULATORY_CARE_PROVIDER_SITE_OTHER): Admitting: General Surgery

## 2024-01-08 VITALS — BP 122/77 | HR 81 | Ht 73.0 in | Wt 233.0 lb

## 2024-01-08 DIAGNOSIS — C50822 Malignant neoplasm of overlapping sites of left male breast: Secondary | ICD-10-CM

## 2024-01-08 DIAGNOSIS — Z08 Encounter for follow-up examination after completed treatment for malignant neoplasm: Secondary | ICD-10-CM

## 2024-01-08 DIAGNOSIS — Z170 Estrogen receptor positive status [ER+]: Secondary | ICD-10-CM

## 2024-01-08 NOTE — Progress Notes (Signed)
 Outpatient Surgical Follow Up  01/08/2024  Danis Pembleton is an 63 y.o. male.   Chief Complaint  Patient presents with   Routine Post Op    HPI: Patient returns today status post left mastectomy with sentinel lymph node biopsy.  He reports doing well.  He says he has minimal pain and this is controlled by Tylenol.  He denies any drainage from the incision.  He says that his drains are lightening up.  He is tolerating a normal diet and having bowel function.  Past Medical History:  Diagnosis Date   Anxiety    Aortic atherosclerosis    Breast cancer in male [facility])    Coronary artery calcification seen on CT scan    Drug-induced erectile dysfunction    Esophagitis    Gout    History of irregular heartbeat    Hypertension     Past Surgical History:  Procedure Laterality Date   AXILLARY SENTINEL NODE BIOPSY Left 01/02/2024   Procedure: BIOPSY, LYMPH NODE, SENTINEL, AXILLARY;  Surgeon: Marinda Jayson KIDD, MD;  Location: [facility] ORS;  Service: General;  Laterality: Left;   BACK SURGERY  1997   BREAST BIOPSY Left 12/22/2023   US  LT BREAST BX W LOC DEV 1ST LESION IMG BX SPEC US  GUIDE 12/22/2023 [facility]   COLONOSCOPY WITH PROPOFOL  N/A 11/09/2021   Procedure: COLONOSCOPY WITH PROPOFOL ;  Surgeon: Therisa Bi, MD;  Location: [facility] ENDOSCOPY;  Service: Gastroenterology;  Laterality: N/A;   KNEE SURGERY  2014   LAPAROSCOPIC APPENDECTOMY N/A 02/02/2018   Procedure: APPENDECTOMY LAPAROSCOPIC;  Surgeon: Rodolph Romano, MD;  Location: [facility] ORS;  Service: General;  Laterality: N/A;   TOTAL MASTECTOMY Left 01/02/2024   Procedure: MASTECTOMY, SIMPLE;  Surgeon: Marinda Jayson KIDD, MD;  Location: [facility] ORS;  Service: General;  Laterality: Left;    Family History  Problem Relation Age of Onset   Hyperlipidemia Mother    Leukemia Mother    Stroke Father    Hyperlipidemia Father    Diabetes Father    Heart disease Father    Thyroid disease Sister    Thyroid disease Brother    Lung cancer  Brother    Liver cancer Brother    Breast cancer Neg Hx     Social History:  reports that he has quit smoking. His smoking use included cigars. He has been exposed to tobacco smoke. He has never used smokeless tobacco. He reports current alcohol use of about 3.0 standard drinks of alcohol per week. He reports that he does not use drugs.  Allergies:  Allergies  Allergen Reactions   Amlodipine Swelling   Morphine And Codeine Itching    Medications reviewed.    ROS Full ROS performed and is otherwise negative other than what is stated in HPI   BP 122/77   Pulse 81   Ht 6' 1 (1.854 m)   Wt 233 lb (105.7 kg)   SpO2 98%   BMI 30.74 kg/m   Physical Exam Left breast examined.  This is incision is healing well without any signs of erythema.  Towards the axilla there is some fullness and maybe some fluid underneath the skin with some bruising.  His drains are in place is soft flat drain is putting out serous fluid and it is only approximately 15 to 20 cc/day.  His axillary drain is putting out a little bit more approximately 25 to 30 cc/day and is still serosanguineous.    SURGICAL PATHOLOGY [facility] [address] [place],  [place] [address] Telephone [phone] or [phone] Fax [phone]  REPORT OF SURGICAL PATHOLOGY   Accession #: [id number] Patient Name: GANON, DEMASI Visit # : [id number]  MRN: [id number] Physician: Marinda Savant DOB/Age September 19, 1960 (Age: 54) Gender: M Collected Date: 01/02/2024 Received Date: 01/05/2024  FINAL DIAGNOSIS       1. 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      - DUCTAL CARCINOMA IN SITU (DCIS).      - SEE CANCER SUMMARY BELOW.      - PRIOR BIOPSY SITE CHANGE WITH VENUS CLIP.      - SKIN WITH INCIDENTAL SEBORRHEIC KERATOSES (4) AND HEMANGIOMA.       2. Lymph node, sentinel, biopsy, Left breast node #1 :      - ONE LYMPH NODE  NEGATIVE FOR MALIGNANCY (0/1)      - SEE CANCER SUMMARY BELOW.       3. Lymph node, sentinel, biopsy, Left breast node #2 :      - SIX LYMH NODES NEGATIVE FOR MALIGNANCY (0/6).      - SEE CANCER SUMMARY BELOW.    Assessment/Plan:  Patient status post left breast mastectomy with sentinel lymph node biopsy.  Overall doing well.  Drain 1 removed in clinic today.  Discussed pathology with him.  Continue ice as needed to the axilla and massaging the axilla.  No over l head lifting for 4 weeks after surgery.  Okay to shower and drive.  We will see him again on Tuesday and if not much out from his drain we can remove it   Savant Marinda, M.D. [facility]

## 2024-01-08 NOTE — Patient Instructions (Signed)
 You may change the gauze dressing as needed. You may use ice to the area to keep the fluid down. Record your drain output. Do strip the drain once a day after you empty it.   Skin Sparing Mastectomy, Care After The following information offers guidance on how to care for yourself after your procedure. Your health care provider may also give you more specific instructions. If you have problems or questions, contact your health care provider. What can I expect after the procedure? After the procedure, it is common to have: Pain and breast tenderness. Breast swelling. Numbness or tingling. Stiffness in your arm or shoulder. A change in breast shape. A change in how the breast feels. Follow these instructions at home: Incision care  Avoid wearing a bra following your procedure to allow the incision to heal. Your health care provider will tell you when it is safe to wear a bra. Follow instructions from your health care provider about how to take care of your incision. Make sure you: Wash your hands with soap and water for at least 20 seconds before and after you change your bandage (dressing). If soap and water are not available, use hand sanitizer. Change your dressing as told by your health care provider. Keep your dressing dry. Leave stitches (sutures), skin glue, or adhesive strips in place. These skin closures may need to stay in place for 2 weeks or longer. If adhesive strip edges start to loosen and curl up, you may trim the loose edges. Do not remove adhesive strips completely unless your health care provider tells you to do that. Check your incision area and drain area every day for signs of infection. Check for: Redness, swelling, or pain. Fluid or blood. Warmth. Pus or a bad smell. Medicines Take over-the-counter and prescription medicines only as told by your health care provider. Ask your health care provider if the medicine prescribed to you: Requires you to avoid driving or  using machinery. Can cause constipation. You may need to take these actions to prevent or treat constipation: Drink enough fluid to keep your urine pale yellow. Take over-the-counter or prescription medicines. Eat foods that are high in fiber, such as beans, whole grains, and fresh fruits and vegetables. Limit foods that are high in fat and processed sugars, such as fried or sweet foods. Activity  Avoid exercise that requires a lot of effort (is strenuous). Be careful to avoid any activities that could cause an injury to the arm that is on the side of your surgery. You may have to avoid lifting. Ask your health care provider how much you can safely lift. Avoid lifting with the arm that is on the side of your surgery. Ask for help with child care, meal preparation, laundry, and shopping, if you are responsible for these things. Return to your normal activities as told by your health care provider. Ask your health care provider what activities are safe for you. General instructions  Do not take baths, swim, or use a hot tub until your health care provider approves. Ask your health care provider if you may take showers. You may only be allowed to take sponge baths. Do not drive until your health care provider approves. If you have a drain, empty the fluid from the removable drain bulb as directed by your health care provider. Keep all follow-up visits. This is important. Contact a health care provider if: You cannot eat or drink without vomiting. You have more redness, swelling, or pain around your incision  or drain. You have fluid or blood coming from your incision or drain. Your incision or drain area feels warm to the touch. You have pus or a bad smell coming from your incision or drain. You develop a fever. Get help right away if: You have new or sudden chest pain. You have chest pain or trouble breathing that gets worse. These symptoms may be an emergency. Get help right away. Call  911. Do not wait to see if the symptoms will go away. Do not drive yourself to the hospital. Summary Check your incision area and drain area every day for signs of infection, such as redness, swelling, pain, fluid, blood, warmth, pus, or a bad smell. Contact your health care provider if you have a fever. Ask your health care provider if the medicine prescribed to you requires you to avoid driving or using machinery. Keep all follow-up visits. This is important. This information is not intended to replace advice given to you by your health care provider. Make sure you discuss any questions you have with your health care provider. Document Revised: 03/12/2021 Document Reviewed: 03/12/2021 Elsevier Patient Education  2024 [employer].

## 2024-01-12 ENCOUNTER — Inpatient Hospital Stay

## 2024-01-13 ENCOUNTER — Encounter: Payer: Self-pay | Admitting: General Surgery

## 2024-01-13 ENCOUNTER — Ambulatory Visit (INDEPENDENT_AMBULATORY_CARE_PROVIDER_SITE_OTHER): Admitting: General Surgery

## 2024-01-13 VITALS — BP 156/80 | HR 75 | Ht 73.0 in | Wt 235.0 lb

## 2024-01-13 DIAGNOSIS — Z9012 Acquired absence of left breast and nipple: Secondary | ICD-10-CM

## 2024-01-13 DIAGNOSIS — Z08 Encounter for follow-up examination after completed treatment for malignant neoplasm: Secondary | ICD-10-CM

## 2024-01-13 DIAGNOSIS — Z853 Personal history of malignant neoplasm of breast: Secondary | ICD-10-CM

## 2024-01-13 DIAGNOSIS — C50822 Malignant neoplasm of overlapping sites of left male breast: Secondary | ICD-10-CM

## 2024-01-13 NOTE — Patient Instructions (Signed)
 We referred you to [facility] to help with the range of motion, they will call you to schedule an appointment.  We will see you back in 6 weeks   GENERAL POST-OPERATIVE PATIENT INSTRUCTIONS   WOUND CARE INSTRUCTIONS:  Keep a dry clean dressing on the wound if there is drainage. The initial bandage may be removed after 24 hours.  Once the wound has quit draining you may leave it open to air.  If clothing rubs against the wound or causes irritation and the wound is not draining you may cover it with a dry dressing during the daytime.  Try to keep the wound dry and avoid ointments on the wound unless directed to do so.  If the wound becomes bright red and painful or starts to drain infected material that is not clear, please contact your physician immediately.  If the wound is mildly pink and has a thick firm ridge underneath it, this is normal, and is referred to as a healing ridge.  This will resolve over the next 4-6 weeks.  BATHING: You may shower if you have been informed of this by your surgeon. However, Please do not submerge in a tub, hot tub, or pool until incisions are completely sealed or have been told by your surgeon that you may do so.  DIET:  You may eat any foods that you can tolerate.  It is a good idea to eat a high fiber diet and take in plenty of fluids to prevent constipation.  If you do become constipated you may want to take a mild laxative or take ducolax tablets on a daily basis until your bowel habits are regular.  Constipation can be very uncomfortable, along with straining, after recent surgery.  ACTIVITY:  You are encouraged to cough and deep breath or use your incentive spirometer if you were given one, every 15-30 minutes when awake.  This will help prevent respiratory complications and low grade fevers post-operatively if you had a general anesthetic.  You may want to hug a pillow when coughing and sneezing to add additional support to the surgical area, if you  had abdominal or chest surgery, which will decrease pain during these times.  You are encouraged to walk and engage in light activity for the next two weeks.  You should not lift more than 20 pounds for 6 weeks total after surgery as it could put you at increased risk for complications.  Twenty pounds is roughly equivalent to a plastic bag of groceries. At that time- Listen to your body when lifting, if you have pain when lifting, stop and then try again in a few days. Soreness after doing exercises or activities of daily living is normal as you get back in to your normal routine.  MEDICATIONS:  Try to take narcotic medications and anti-inflammatory medications, such as tylenol , ibuprofen , naprosyn, etc., with food.  This will minimize stomach upset from the medication.  Should you develop nausea and vomiting from the pain medication, or develop a rash, please discontinue the medication and contact your physician.  You should not drive, make important decisions, or operate machinery when taking narcotic pain medication.  SUNBLOCK Use sun block to incision area over the next year if this area will be exposed to sun. This helps decrease scarring and will allow you avoid a permanent darkened area over your incision.  QUESTIONS:  Please feel free to call our office if you have any questions, and we will be glad to assist  you. [phone]

## 2024-01-14 NOTE — Progress Notes (Signed)
 Outpatient Surgical Follow Up    Kurt Mendoza is an 63 y.o. male.   Chief Complaint  Patient presents with   Routine Post Op    HPI: Patient returns today status post left mastectomy with sentinel lymph node biopsy.  He reports doing well.  He is having serous drainage out of his drain and says its only about 15 cc per the whole day.  Denies any fevers or chills.  Denies any drainage from his incisions.  Past Medical History:  Diagnosis Date   Anxiety    Aortic atherosclerosis    Breast cancer in male [employer])    Coronary artery calcification seen on CT scan    Drug-induced erectile dysfunction    Esophagitis    Gout    History of irregular heartbeat    Hypertension     Past Surgical History:  Procedure Laterality Date   AXILLARY SENTINEL NODE BIOPSY Left 01/02/2024   Procedure: BIOPSY, LYMPH NODE, SENTINEL, AXILLARY;  Surgeon: Marinda Jayson KIDD, MD;  Location: [facility] ORS;  Service: General;  Laterality: Left;   BACK SURGERY  1997   BREAST BIOPSY Left 12/22/2023   US  LT BREAST BX W LOC DEV 1ST LESION IMG BX SPEC US  GUIDE 12/22/2023 [facility]   COLONOSCOPY WITH PROPOFOL  N/A 11/09/2021   Procedure: COLONOSCOPY WITH PROPOFOL ;  Surgeon: Therisa Bi, MD;  Location: [facility] ENDOSCOPY;  Service: Gastroenterology;  Laterality: N/A;   KNEE SURGERY  2014   LAPAROSCOPIC APPENDECTOMY N/A 02/02/2018   Procedure: APPENDECTOMY LAPAROSCOPIC;  Surgeon: Rodolph Romano, MD;  Location: [facility] ORS;  Service: General;  Laterality: N/A;   TOTAL MASTECTOMY Left 01/02/2024   Procedure: MASTECTOMY, SIMPLE;  Surgeon: Marinda Jayson KIDD, MD;  Location: [facility] ORS;  Service: General;  Laterality: Left;    Family History  Problem Relation Age of Onset   Hyperlipidemia Mother    Leukemia Mother    Stroke Father    Hyperlipidemia Father    Diabetes Father    Heart disease Father    Thyroid disease Sister    Thyroid disease Brother    Lung cancer Brother    Liver cancer Brother    Breast cancer Neg  Hx     Social History:  reports that he has quit smoking. His smoking use included cigars. He has been exposed to tobacco smoke. He has never used smokeless tobacco. He reports current alcohol use of about 3.0 standard drinks of alcohol per week. He reports that he does not use drugs.  Allergies:  Allergies  Allergen Reactions   Amlodipine Swelling   Morphine And Codeine Itching    Medications reviewed.    ROS Full ROS performed and is otherwise negative other than what is stated in HPI   BP (!) 156/80   Pulse 75   Ht 6' 1 (1.854 m)   Wt 235 lb (106.6 kg)   SpO2 98%   BMI 31.00 kg/m   Physical Exam  Left breast incision is healing well there is some fullness in the upper axilla but no evidence of infection or seroma.  His drain is serous in nature.   Pathology previously discussed with patient.  Assessment/Plan:  1. History of left breast cancer (Primary) Remove drain, will see him in several weeks. - Ambulatory referral to Physical Therapy  2. Status post mastectomy, left Will refer to physical therapy for range of motion exercises - Ambulatory referral to Physical Therapy    Jayson Marinda, M.D. [facility]

## 2024-01-21 ENCOUNTER — Inpatient Hospital Stay: Attending: Internal Medicine

## 2024-01-21 DIAGNOSIS — Z79810 Long term (current) use of selective estrogen receptor modulators (SERMs): Secondary | ICD-10-CM | POA: Insufficient documentation

## 2024-01-21 DIAGNOSIS — Z87891 Personal history of nicotine dependence: Secondary | ICD-10-CM | POA: Insufficient documentation

## 2024-01-21 DIAGNOSIS — Z9012 Acquired absence of left breast and nipple: Secondary | ICD-10-CM | POA: Insufficient documentation

## 2024-01-21 DIAGNOSIS — Z1721 Progesterone receptor positive status: Secondary | ICD-10-CM | POA: Insufficient documentation

## 2024-01-21 DIAGNOSIS — Z170 Estrogen receptor positive status [ER+]: Secondary | ICD-10-CM | POA: Insufficient documentation

## 2024-01-21 DIAGNOSIS — Z1732 Human epidermal growth factor receptor 2 negative status: Secondary | ICD-10-CM | POA: Insufficient documentation

## 2024-01-21 DIAGNOSIS — C50822 Malignant neoplasm of overlapping sites of left male breast: Secondary | ICD-10-CM | POA: Insufficient documentation

## 2024-01-21 NOTE — Progress Notes (Signed)
 Multidisciplinary Oncology Council Documentation  Kurt Mendoza was presented by our [facility] on 01/21/2024, which included representatives from:  Palliative Care Dietitian  Physical/Occupational Therapist Nurse Navigator Genetics Social work Survivorship RN Financial Navigator Research RN   Kurt Mendoza currently presents with history of breast cancer  We reviewed previous medical and familial history, history of present illness, and recent lab results along with all available histopathologic and imaging studies. The MOC considered available treatment options and made the following recommendations/referrals:  Genetics, rehab screening, SW  The MOC is a meeting of clinicians from various specialty areas who evaluate and discuss patients for whom a multidisciplinary approach is being considered. Final determinations in the plan of care are those of the provider(s).   Today's extended care, comprehensive team conference, Kurt Mendoza was not present for the discussion and was not examined.

## 2024-01-23 ENCOUNTER — Encounter: Payer: Self-pay | Admitting: *Deleted

## 2024-01-23 NOTE — Progress Notes (Signed)
 Oncotype Dx result is still pending.   [facility] has not received the specimen from [facility].   Per [name] at [facility] she did not receive the request to send the specimen until 12/3.  She is currently waiting to hear back from the pathologist which slide to send out and should have it sent to [facility] today.   Per Dr. Rennie he would like Kurt Mendoza appointment cancelled for now and will reschedule after we have an estimated report date.   Attempted to call [name] but he did not answer and VM was full.   Mychart message sent asking him to call to discuss the appt. On Monday.

## 2024-01-26 ENCOUNTER — Inpatient Hospital Stay: Admitting: Internal Medicine

## 2024-01-26 ENCOUNTER — Inpatient Hospital Stay

## 2024-01-26 NOTE — Progress Notes (Signed)
 Tumor Board Documentation  Irbin Fines was presented by Dr. Rennie at our Tumor Board on 01/26/2024, which included representatives from medical oncology, radiation oncology, radiology, pathology, navigation, research.  Ryaan currently presents as a current patient with history of the following treatments: surgical intervention(s).  Additionally, we reviewed previous medical and familial history, history of present illness, and recent lab results along with all available histopathologic and imaging studies. The tumor board considered available treatment options and made the following recommendations: Hormonal therapy Oncotype Dx pending  The following procedures/referrals were also placed: No orders of the defined types were placed in this encounter.   Clinical Trial Status: not discussed   Staging used: AJCC Staging: T: T1c N: N0        National site-specific guidelines   were discussed with respect to the case.  Tumor board is a meeting of clinicians from various specialty areas who evaluate and discuss patients for whom a multidisciplinary approach is being considered. Final determinations in the plan of care are those of the provider(s). The responsibility for follow up of recommendations given during tumor board is that of the provider.   Today's extended care, comprehensive team conference, Mosie was not present for the discussion and was not examined.   Multidisciplinary Tumor Board is a multidisciplinary case peer review process.  Decisions discussed in the Multidisciplinary Tumor Board reflect the opinions of the specialists present at the conference without having examined the patient.  Ultimately, treatment and diagnostic decisions rest with the primary provider(s) and the patient.

## 2024-01-28 ENCOUNTER — Encounter: Payer: Self-pay | Admitting: *Deleted

## 2024-01-28 ENCOUNTER — Inpatient Hospital Stay: Admitting: Licensed Clinical Social Worker

## 2024-01-28 NOTE — Progress Notes (Signed)
 Estimated result date for oncotype Dx is 02/10/24.   Kurt Mendoza will see Dr. Rennie on 02/11/24.

## 2024-01-28 NOTE — Progress Notes (Signed)
 [facility] Clinical Social Work  Clinical Social Work was referred by [facility] for assessment of psychosocial needs.  Clinical Social Worker contacted patient by phone to offer support and assess for needs.     Interventions: Provided patient with information about CSW role, including advance directives, counseling and resources.  Patient stated he had no current needs.       Follow Up Plan:  CSW will follow-up with patient by phone     Macario CHRISTELLA Au, LCSW  Clinical Social Worker [facility]

## 2024-02-02 DIAGNOSIS — Z170 Estrogen receptor positive status [ER+]: Secondary | ICD-10-CM | POA: Diagnosis not present

## 2024-02-02 DIAGNOSIS — C50822 Malignant neoplasm of overlapping sites of left male breast: Secondary | ICD-10-CM | POA: Diagnosis not present

## 2024-02-02 NOTE — Therapy (Signed)
 OUTPATIENT PHYSICAL THERAPY UPPER EXTREMITY EVALUATION   Patient Name: Kurt Mendoza MRN: [id number] DOB:1960-03-01, 63 y.o., male Today's Date: 02/04/2024  END OF SESSION:  PT End of Session - 02/03/24 1618     Visit Number 1    Number of Visits 8    Date for Recertification  03/02/24    PT Start Time 1530    PT Stop Time 1614    PT Time Calculation (min) 44 min    Activity Tolerance Patient tolerated treatment well    Behavior During Therapy WFL for tasks assessed/performed          Past Medical History:  Diagnosis Date   Anxiety    Aortic atherosclerosis    Breast cancer in male [facility])    Coronary artery calcification seen on CT scan    Drug-induced erectile dysfunction    Esophagitis    Gout    History of irregular heartbeat    Hypertension    Past Surgical History:  Procedure Laterality Date   AXILLARY SENTINEL NODE BIOPSY Left 01/02/2024   Procedure: BIOPSY, LYMPH NODE, SENTINEL, AXILLARY;  Surgeon: Marinda Jayson KIDD, MD;  Location: [facility] ORS;  Service: General;  Laterality: Left;   BACK SURGERY  1997   BREAST BIOPSY Left 12/22/2023   US  LT BREAST BX W LOC DEV 1ST LESION IMG BX SPEC US  GUIDE 12/22/2023 [facility]   COLONOSCOPY WITH PROPOFOL  N/A 11/09/2021   Procedure: COLONOSCOPY WITH PROPOFOL ;  Surgeon: Therisa Bi, MD;  [facility];  Service: Gastroenterology;  Laterality: N/A;   KNEE SURGERY  2014   LAPAROSCOPIC APPENDECTOMY N/A 02/02/2018   Procedure: APPENDECTOMY LAPAROSCOPIC;  Surgeon: Rodolph Romano, MD;  Location: [facility];  Service: General;  Laterality: N/A;   TOTAL MASTECTOMY Left 01/02/2024   Procedure: MASTECTOMY, SIMPLE;  Surgeon: Marinda Jayson KIDD, MD;  Location: [facility] OR[facility];  Service: General;  Laterality: Left;   Patient Active Problem List   Diagnosis Date Noted   History of left breast cancer 01/02/2024   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, estrogen receptor positive (HCC) 12/26/2023   Anxiety  10/11/2022   Drug-induced erectile dysfunction 10/11/2022   Encounter for screening colonoscopy    Adenomatous polyp of colon    Primary osteoarthritis of both first carpometacarpal joints 09/18/2020   Aortic atherosclerosis 05/09/2020   Chronic neck pain 03/03/2019   Arthritis of both hands 07/07/2018   Gout 04/04/2015   Essential hypertension     PCP: Edman Marsa MART PROVIDER: Marinda Jayson   REFERRING DIAG: Breast Cx, s/p mastectomy  THERAPY DIAG:  Stiffness of left shoulder, not elsewhere classified  Acute pain of left shoulder  Rationale for Evaluation and Treatment: Rehabilitation  ONSET DATE: 01/02/2024  SUBJECTIVE:  SUBJECTIVE STATEMENT: Patient reports his L pectoral region and arm is very tight.  Hand dominance: Right  PERTINENT HISTORY: Patient underwent total mastectomy of L on 01/02/2024. PMH includes anxiety, breast cancer, aortic atherosclerosis, esophagitis, gout, and HTN. Patient's tubing was removed a few weeks ago. Patient wants to regain full ROM of shoulder.   PAIN:  Are you having pain? Yes: NPRS scale: 4-5/10 Pain location: pain in incision, tweaks and twinges Pain description: tweaks or twinges Aggravating factors: unsure Relieving factors: unsure   PRECAUTIONS: None; no precautions after first 4 weeks, no lifting > 20 lb overhead   RED FLAGS: S/p mastectomy    WEIGHT BEARING RESTRICTIONS: No  FALLS:  Has patient fallen in last 6 months? 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   OBJECTIVE:  Note: Objective measures were completed at Evaluation unless otherwise noted.  DIAGNOSTIC FINDINGS:  N/a  PATIENT SURVEYS :  Quick Dash:  QUICK DASH  Please rate your ability do the following activities in the last week by selecting the number below the appropriate response.   Activities Rating  Open a tight or new jar.  2 = Mild difficulty  Do heavy household chores (e.g., wash walls, floors). 3 = Moderate difficulty  Carry a shopping bag or briefcase 2 = Mild difficulty  Wash your back. 3 = Moderate difficulty  Use a knife to cut food. 1 = No difficulty   Recreational activities in which you take some force or impact through your arm, shoulder or hand (e.g., golf, hammering, tennis, etc.). 3 = Moderate difficulty  During the past week, to what extent has your arm, shoulder or hand problem interfered with your normal social activities with family, friends, neighbors or groups?  3 = Moderately  During the past week, were you limited in your work or other regular daily activities as a result of your arm, shoulder or hand problem? 3 = Moderately limited  Rate the severity of the following symptoms in the last week: Arm, Shoulder, or hand pain. 3 = Moderate  Rate the severity of the following symptoms in the last week: Tingling (pins and needles) in your arm, shoulder or hand. 1 = none  During the past week, how much difficulty have you had sleeping because of the pain in your arm, shoulder or hand?  3 = Moderate difficulty   (A QuickDASH score may not be calculated if there is greater than 1 missing item.)  Quick Dash Disability/Symptom Score: [(sum of 27 (n) responses/11 (n)] x 25 = 36.36  Minimally Clinically Important Difference (MCID): 15-20 points  (Franchignoni, F. et al. (2013). Minimally clinically important difference of the disabilities of the arm, shoulder, and hand outcome measures (DASH) and its shortened version  (Quick DASH). Journal of Orthopaedic & Sports Physical Therapy, 44(1), 30-39)   COGNITION: Overall cognitive status: Within functional limits for tasks assessed     SENSATION: Arms WFL, loss of sensation around scar.   POSTURE: WFL  UPPER EXTREMITY ROM:   Active ROM Right eval Left eval  Shoulder flexion 162 108  Shoulder extension 91 62  Shoulder abduction 156 110 with scapular hike   Shoulder adduction    Shoulder  internal rotation T9 T9  Shoulder external rotation T4 Top of head  Elbow flexion    Elbow extension    Wrist flexion    Wrist extension    Wrist ulnar deviation    Wrist radial deviation    Wrist pronation    Wrist supination    (Blank rows = not tested)  UPPER EXTREMITY MMT:  MMT Right eval Left eval  Shoulder flexion 5 4+  Shoulder extension 5 4  Shoulder abduction 5 5  Shoulder adduction 4 4  Shoulder internal rotation    Shoulder external rotation    Middle trapezius    Lower trapezius    Elbow flexion    Elbow extension    Wrist flexion    Wrist extension    Wrist ulnar deviation    Wrist radial deviation    Wrist pronation    Wrist supination    Grip strength (lbs)    (Blank rows = not tested)  SHOULDER SPECIAL TESTS: Not appropriate at this time    PALPATION:  Scar Tissue of L pectoral: extensive tissue tension near and on scar,  Axilla region tight and full palpation                                                                                                                              TREATMENT DATE: 02/04/2024 Supine pectoral stretching actively with multidirectional angle  HEP education and performance: see below for details   PATIENT EDUCATION: Education details: goals, POC, HEP Person educated: Patient Education method: Programmer, Multimedia, Facilities Manager, Actor cues, Verbal cues, and Handouts Education comprehension: verbalized understanding, returned demonstration, verbal cues required, tactile cues required, and  needs further education  HOME EXERCISE PROGRAM: Access Code: [email or web address] URL: [email or web address] Date: 02/03/2024 Prepared by: Mayford Custard  Exercises - Doorway Pec Stretch at 60 Elevation  - 1 x daily - 7 x weekly - 2 sets - 2 reps - 60 hold - Standing 'L' Stretch at [employer]  - 1 x daily - 7 x weekly - 2 sets - 10 reps - 5 hold - Seated Single Arm Shoulder Abduction and External Rotation AROM  - 1 x daily - 7 x weekly - 2 sets - 10 reps - 5 hold - Seated Scapular Retraction  - 1 x daily - 7 x weekly - 2 sets - 10 reps - 5 hold  ASSESSMENT:  CLINICAL IMPRESSION: Patient is a pleasant 63 y.o. male who was seen today for physical therapy evaluation and treatment for  total mastectomy of L on 01/02/2024. Patient has extensive scarring of L pectoral region with significant tension of anatomy bordering region. He additionally has limited ROM of L shoulder which is impacting his ability to don and doff clothing. Patient is highly motivated for return to functional movement. He is working full time and independent at baseline. No precautions other than limiting lifting >20 lb overhead at this time. His sleep is affected at this time. Patient will  benefit from skilled physical therapy to improve ROM, quality of life, and independent function.    OBJECTIVE IMPAIRMENTS: decreased ROM, increased fascial restrictions, impaired flexibility, impaired sensation, impaired UE functional use, improper body mechanics, postural dysfunction, and pain.   ACTIVITY LIMITATIONS: carrying, lifting, dressing, reach over head, and hygiene/grooming  PARTICIPATION LIMITATIONS: meal prep, cleaning, laundry, driving, shopping, community activity, and occupation  PERSONAL FACTORS: Age, Past/current experiences, Time since onset of injury/illness/exacerbation, and 3+ comorbidities: anxiety, breast cancer, aortic atherosclerosis, esophagitis, gout, and HTN. are also affecting patient's functional outcome.    REHAB POTENTIAL: Good  CLINICAL DECISION MAKING: Evolving/moderate complexity  EVALUATION COMPLEXITY: Moderate  GOALS: Goals reviewed with patient? Yes  SHORT TERM GOALS: Target date: 02/17/2024    Patient will be independent in home exercise program to improve strength/mobility for better functional independence with ADLs. Baseline:12/16: HEP given  Goal status: INITIAL   LONG TERM GOALS: Target date: 03/02/2024   Patient will improve shoulder AROM to > 140 degrees of flexion and abduction for improved ability to perform overhead activities.  Baseline: 12/16: flexion 108, abduction 110 Goal status: INITIAL  2.   Patient will reduce modified Oswestry score to <20 as to demonstrate minimal disability with ADLs including improved sleeping tolerance, walking/sitting tolerance etc for better mobility with ADLs.  Baseline: 12/16: 36.36% Goal status: INITIAL  3.  Patient will demonstrate adequate shoulder ROM and strength to be able to shave and dress independently with pain less than 3/10. Baseline: Patient has 5/10 pain with donning/doffing clothing Goal status: INITIAL   PLAN: PT FREQUENCY: 2x/week  PT DURATION: 4 weeks  PLANNED INTERVENTIONS: 97164- PT Re-evaluation, 97750- Physical Performance Testing, 97110-Therapeutic exercises, 97530- Therapeutic activity, [id number]- Neuromuscular re-education, 97535- Self Care, 02859- Manual therapy, [phone]- Canalith repositioning, H9716- Electrical stimulation (unattended), [phone]- Electrical stimulation (manual), [id number]- Ultrasound, 02987- Traction (mechanical), 20560 (1-2 muscles), 20561 (3+ muscles)- Dry Needling, Patient/Family education, Taping, Joint mobilization, Joint manipulation, Spinal manipulation, Spinal mobilization, Manual lymph drainage, Scar mobilization, Compression bandaging, Vestibular training, Visual/preceptual remediation/compensation, Cognitive remediation, Cryotherapy, Moist heat, and Biofeedback  PLAN FOR NEXT  SESSION: scar tissue massage/ release. ROM interventions    Jalal Rauch, PT 02/04/2024, 9:05 AM

## 2024-02-03 ENCOUNTER — Encounter: Payer: Self-pay | Admitting: Internal Medicine

## 2024-02-03 ENCOUNTER — Ambulatory Visit

## 2024-02-03 ENCOUNTER — Encounter: Payer: Self-pay | Admitting: *Deleted

## 2024-02-03 DIAGNOSIS — Z9012 Acquired absence of left breast and nipple: Secondary | ICD-10-CM | POA: Insufficient documentation

## 2024-02-03 DIAGNOSIS — M25512 Pain in left shoulder: Secondary | ICD-10-CM | POA: Diagnosis not present

## 2024-02-03 DIAGNOSIS — M25612 Stiffness of left shoulder, not elsewhere classified: Secondary | ICD-10-CM | POA: Insufficient documentation

## 2024-02-03 DIAGNOSIS — Z853 Personal history of malignant neoplasm of breast: Secondary | ICD-10-CM | POA: Diagnosis not present

## 2024-02-03 NOTE — Progress Notes (Signed)
 Discussed oncotype results with Mr. Adir, he will not need chemotherapy.  He will see Dr. KATHEE on Friday 12/19 to further discuss.   Dr. Rennie would also like him to see Dr. Lenn to discuss radiation, the first available appointment for consultation is 02/25/24.   Appt. Details given to him.

## 2024-02-04 NOTE — Therapy (Signed)
 OUTPATIENT PHYSICAL THERAPY UPPER EXTREMITY TREATMENT   Patient Name: Kurt Mendoza MRN: [id number] DOB:1961-01-22, 63 y.o., male Today's Date: 02/05/2024  END OF SESSION:  PT End of Session - 02/05/24 1147     Visit Number 2    Number of Visits 8    Date for Recertification  03/02/24    PT Start Time 1146    PT Stop Time 1229    PT Time Calculation (min) 43 min    Activity Tolerance Patient tolerated treatment well    Behavior During Therapy WFL for tasks assessed/performed           Past Medical History:  Diagnosis Date   Anxiety    Aortic atherosclerosis    Breast cancer in male [facility])    Coronary artery calcification seen on CT scan    Drug-induced erectile dysfunction    Esophagitis    Gout    History of irregular heartbeat    Hypertension    Past Surgical History:  Procedure Laterality Date   AXILLARY SENTINEL NODE BIOPSY Left 01/02/2024   Procedure: BIOPSY, LYMPH NODE, SENTINEL, AXILLARY;  Surgeon: Marinda Jayson KIDD, MD;  Location: [facility];  Service: General;  Laterality: Left;   BACK SURGERY  1997   BREAST BIOPSY Left 12/22/2023   US  LT BREAST BX W LOC DEV 1ST LESION IMG BX SPEC US  GUIDE 12/22/2023 [facility]   COLONOSCOPY WITH PROPOFOL  N/A 11/09/2021   Procedure: COLONOSCOPY WITH PROPOFOL ;  Surgeon: Therisa Bi, MD;  Location: [facility] ENDOSCOPY;  Service: Gastroenterology;  Laterality: N/A;   KNEE SURGERY  2014   LAPAROSCOPIC APPENDECTOMY N/A 02/02/2018   Procedure: APPENDECTOMY LAPAROSCOPIC;  Surgeon: Rodolph Romano, MD;  Location: [facility]S;  Service: General;  Laterality: N/A;   TOTAL MASTECTOMY Left 01/02/2024   Procedure: MASTECTOMY, SIMPLE;  Surgeon: Marinda Jayson KIDD, MD;  Location: [facility];  Service: General;  Laterality: Left;   Patient Active Problem List   Diagnosis Date Noted   History of left breast cancer 01/02/2024   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, estrogen receptor positive (HCC) 12/26/2023   Anxiety  10/11/2022   Drug-induced erectile dysfunction 10/11/2022   Encounter for screening colonoscopy    Adenomatous polyp of colon    Primary osteoarthritis of both first carpometacarpal joints 09/18/2020   Aortic atherosclerosis 05/09/2020   Chronic neck pain 03/03/2019   Arthritis of both hands 07/07/2018   Gout 04/04/2015   Essential hypertension     PCP: Edman Marsa MART PROVIDER: Marinda Jayson   REFERRING DIAG: Breast Cx, s/p mastectomy  THERAPY DIAG:  Stiffness of left shoulder, not elsewhere classified  Acute pain of left shoulder  Rationale for Evaluation and Treatment: Rehabilitation  ONSET DATE: 01/02/2024  SUBJECTIVE:  SUBJECTIVE STATEMENT: Patient reports compliance with HEP. Feels tightness in scar region.  Hand dominance: Right  PERTINENT HISTORY: Patient underwent total mastectomy of L on 01/02/2024. PMH includes anxiety, breast cancer, aortic atherosclerosis, esophagitis, gout, and HTN. Patient's tubing was removed a few weeks ago. Patient wants to regain full ROM of shoulder.   PAIN:  Are you having pain? Yes: NPRS scale: 4-5/10 Pain location: pain in incision, tweaks and twinges Pain description: tweaks or twinges Aggravating factors: unsure Relieving factors: unsure   PRECAUTIONS: None; no precautions after first 4 weeks, no lifting > 20 lb overhead   RED FLAGS: S/p mastectomy    WEIGHT BEARING RESTRICTIONS: No  FALLS:  Has patient fallen in last 6 months? 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   OBJECTIVE:  Note: Objective measures were completed at Evaluation unless otherwise noted.  DIAGNOSTIC FINDINGS:  N/a  PATIENT SURVEYS :  Quick Dash:  QUICK DASH  Please rate your ability do the following activities in the last week by selecting the number below the appropriate response.   Activities Rating  Open a tight or new jar.  2 = Mild difficulty  Do heavy household chores (e.g., wash walls, floors). 3 = Moderate difficulty  Carry a shopping bag or briefcase 2 = Mild difficulty  Wash your back. 3 = Moderate difficulty  Use a knife to cut food. 1 = No difficulty   Recreational activities in which you take some force or impact through your arm, shoulder or hand (e.g., golf, hammering, tennis, etc.). 3 = Moderate difficulty  During the past week, to what extent has your arm, shoulder or hand problem interfered with your normal social activities with family, friends, neighbors or groups?  3 = Moderately  During the past week, were you limited in your work or other regular daily activities as a result of your arm, shoulder or hand problem? 3 = Moderately limited  Rate the severity of the following symptoms in the last week: Arm, Shoulder, or hand pain. 3 = Moderate  Rate the severity of the following symptoms in the last week: Tingling (pins and needles) in your arm, shoulder or hand. 1 = none  During the past week, how much difficulty have you had sleeping because of the pain in your arm, shoulder or hand?  3 = Moderate difficulty   (A QuickDASH score may not be calculated if there is greater than 1 missing item.)  Quick Dash Disability/Symptom Score: [(sum of 27 (n) responses/11 (n)] x 25 = 36.36  Minimally Clinically Important Difference (MCID): 15-20 points  (Franchignoni, F. et al. (2013). Minimally clinically important difference of the disabilities of the arm, shoulder, and hand outcome measures (DASH) and its shortened  version (Quick DASH). Journal of Orthopaedic & Sports Physical Therapy, 44(1), 30-39)   COGNITION: Overall cognitive status: Within functional limits for tasks assessed     SENSATION: Arms WFL, loss of sensation around scar.   POSTURE: WFL  UPPER EXTREMITY ROM:   Active ROM Right eval Left eval  Shoulder flexion 162 108  Shoulder extension 91 62  Shoulder abduction 156 110 with scapular hike   Shoulder adduction    Shoulder internal  rotation T9 T9  Shoulder external rotation T4 Top of head  Elbow flexion    Elbow extension    Wrist flexion    Wrist extension    Wrist ulnar deviation    Wrist radial deviation    Wrist pronation    Wrist supination    (Blank rows = not tested)  UPPER EXTREMITY MMT:  MMT Right eval Left eval  Shoulder flexion 5 4+  Shoulder extension 5 4  Shoulder abduction 5 5  Shoulder adduction 4 4  Shoulder internal rotation    Shoulder external rotation    Middle trapezius    Lower trapezius    Elbow flexion    Elbow extension    Wrist flexion    Wrist extension    Wrist ulnar deviation    Wrist radial deviation    Wrist pronation    Wrist supination    Grip strength (lbs)    (Blank rows = not tested)  SHOULDER SPECIAL TESTS: Not appropriate at this time    PALPATION:  Scar Tissue of L pectoral: extensive tissue tension near and on scar,  Axilla region tight and full palpation                                                                                                                              TREATMENT DATE: 02/05/2024 Manual: Scar tissue massage for tension reduction in supine position, extensive soft tissue manipulation and release to L pectoral region and axilla.  X24 minutes   TherEx Dowel flexion AAROM 15x; hold  seconds  supine  Sidelying: abduction 15; x hold 5 seconds  Sidelying ER 15x hold 5 seconds PROM abduction and ER 15x 10 second holds  PATIENT EDUCATION: Education details: goals, POC, HEP Person  educated: Patient Education method: Explanation, Demonstration, Tactile cues, Verbal cues, and Handouts Education comprehension: verbalized understanding, returned demonstration, verbal cues required, tactile cues required, and needs further education  HOME EXERCISE PROGRAM: Access Code: [email or web address] URL: [email or web address] Date: 02/03/2024 Prepared by: Naquita Nappier  Exercises - Doorway Pec Stretch at 60 Elevation  - 1 x daily - 7 x weekly - 2 sets - 2 reps - 60 hold - Standing 'L' Stretch at [employer]  - 1 x daily - 7 x weekly - 2 sets - 10 reps - 5 hold - Seated Single Arm Shoulder Abduction and External Rotation AROM  - 1 x daily - 7 x weekly - 2 sets - 10 reps - 5 hold - Seated Scapular Retraction  - 1 x daily - 7 x weekly - 2 sets - 10 reps - 5 hold  ASSESSMENT:  CLINICAL IMPRESSION: Patient presents with excellent motivation, he tolerates extensive soft tissue release of scar tissue and post surgical region with improved function of LUE s/p manual. Patient agreeable to continue POC at this time.  Patient will benefit from skilled physical therapy to improve ROM, quality of life, and independent function.    OBJECTIVE IMPAIRMENTS:  decreased ROM, increased fascial restrictions, impaired flexibility, impaired sensation, impaired UE functional use, improper body mechanics, postural dysfunction, and pain.   ACTIVITY LIMITATIONS: carrying, lifting, dressing, reach over head, and hygiene/grooming  PARTICIPATION LIMITATIONS: meal prep, cleaning, laundry, driving, shopping, community activity, and occupation  PERSONAL FACTORS: Age, Past/current experiences, Time since onset of injury/illness/exacerbation, and 3+ comorbidities: anxiety, breast cancer, aortic atherosclerosis, esophagitis, gout, and HTN. are also affecting patient's functional outcome.   REHAB POTENTIAL: Good  CLINICAL DECISION MAKING: Evolving/moderate complexity  EVALUATION COMPLEXITY:  Moderate  GOALS: Goals reviewed with patient? Yes  SHORT TERM GOALS: Target date: 02/17/2024    Patient will be independent in home exercise program to improve strength/mobility for better functional independence with ADLs. Baseline:12/16: HEP given  Goal status: INITIAL   LONG TERM GOALS: Target date: 03/02/2024   Patient will improve shoulder AROM to > 140 degrees of flexion and abduction for improved ability to perform overhead activities.  Baseline: 12/16: flexion 108, abduction 110 Goal status: INITIAL  2.   Patient will reduce modified Oswestry score to <20 as to demonstrate minimal disability with ADLs including improved sleeping tolerance, walking/sitting tolerance etc for better mobility with ADLs.  Baseline: 12/16: 36.36% Goal status: INITIAL  3.  Patient will demonstrate adequate shoulder ROM and strength to be able to shave and dress independently with pain less than 3/10. Baseline: Patient has 5/10 pain with donning/doffing clothing Goal status: INITIAL   PLAN: PT FREQUENCY: 2x/week  PT DURATION: 4 weeks  PLANNED INTERVENTIONS: 97164- PT Re-evaluation, 97750- Physical Performance Testing, 97110-Therapeutic exercises, 97530- Therapeutic activity, [id number]- Neuromuscular re-education, 97535- Self Care, 02859- Manual therapy, [phone]- Canalith repositioning, H9716- Electrical stimulation (unattended), [phone]- Electrical stimulation (manual), [id number]- Ultrasound, 02987- Traction (mechanical), 20560 (1-2 muscles), 20561 (3+ muscles)- Dry Needling, Patient/Family education, Taping, Joint mobilization, Joint manipulation, Spinal manipulation, Spinal mobilization, Manual lymph drainage, Scar mobilization, Compression bandaging, Vestibular training, Visual/preceptual remediation/compensation, Cognitive remediation, Cryotherapy, Moist heat, and Biofeedback  PLAN FOR NEXT SESSION: scar tissue massage/ release. ROM interventions    Paulett Kaufhold, PT 02/05/2024, 12:31 PM

## 2024-02-05 ENCOUNTER — Ambulatory Visit

## 2024-02-05 DIAGNOSIS — M25512 Pain in left shoulder: Secondary | ICD-10-CM

## 2024-02-05 DIAGNOSIS — M25612 Stiffness of left shoulder, not elsewhere classified: Secondary | ICD-10-CM

## 2024-02-06 ENCOUNTER — Inpatient Hospital Stay: Admitting: Internal Medicine

## 2024-02-06 ENCOUNTER — Encounter: Payer: Self-pay | Admitting: *Deleted

## 2024-02-06 DIAGNOSIS — Z1721 Progesterone receptor positive status: Secondary | ICD-10-CM | POA: Diagnosis not present

## 2024-02-06 DIAGNOSIS — Z170 Estrogen receptor positive status [ER+]: Secondary | ICD-10-CM

## 2024-02-06 DIAGNOSIS — C50822 Malignant neoplasm of overlapping sites of left male breast: Secondary | ICD-10-CM

## 2024-02-06 DIAGNOSIS — Z9012 Acquired absence of left breast and nipple: Secondary | ICD-10-CM | POA: Diagnosis not present

## 2024-02-06 DIAGNOSIS — Z79810 Long term (current) use of selective estrogen receptor modulators (SERMs): Secondary | ICD-10-CM | POA: Diagnosis not present

## 2024-02-06 DIAGNOSIS — Z1732 Human epidermal growth factor receptor 2 negative status: Secondary | ICD-10-CM | POA: Diagnosis not present

## 2024-02-06 DIAGNOSIS — Z87891 Personal history of nicotine dependence: Secondary | ICD-10-CM | POA: Diagnosis not present

## 2024-02-06 MED ORDER — TAMOXIFEN CITRATE 20 MG PO TABS: 20.0000 mg | ORAL_TABLET | Freq: Every day | ORAL | 4 refills | Status: AC

## 2024-02-06 NOTE — Progress Notes (Unsigned)
 Patient states no concerns today, just wanting to discuss next steps.

## 2024-02-06 NOTE — Addendum Note (Signed)
 Addended by: GEORGINA SHASTA POUR on: 02/06/2024 09:35 AM   Modules accepted: Orders

## 2024-02-06 NOTE — Progress Notes (Unsigned)
 [facility] CONSULT NOTE  Patient Care Team: Edman Marsa PARAS, DO as PCP - General (Family Medicine) Darliss Rogue, MD as PCP - Cardiology (Cardiology) Herminio Miu, MD (Unknown Physician Specialty) Georgina Shasta POUR, RN as Oncology Nurse Navigator Rennie Cindy SAUNDERS, MD as Consulting Physician (Oncology)  CHIEF COMPLAINTS/PURPOSE OF CONSULTATION: breast cancer  Oncology History Overview Note  pT Category: pT1c  pN Category: pN0  pM Category: Not applicable   SPECIAL STUDIES  Breast Biomarker Testing Performed on Previous Biopsy: [phone]  Estrogen Receptor: Positive (95%, strong)  Progesterone Receptor: Positive (100%, strong)  Proliferation Marker Ki67:  15%  HER2 IHC: Negative (1+   # DEC 2025-Oncotype low risk-no adjuvant chemotherapy.  3 mm anterior/inferior margin- ?  Role for radiation.  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   Discussed the use of AI scribe software for clinical note transcription with the patient, who gave verbal consent to proceed.  History of Present Illness   Kurt Mendoza is a 63 year old male with ER/PR-positive, HER2-negative stage I left breast cancer who presents for post-operative oncology follow-up to discuss adjuvant therapy.  He is recovering following simple mastectomy with negative margins (3 mm) and negative sentinel lymph nodes (0/7). He describes  persistent tightness and soreness at the surgical site, with focal areas of induration attributed to fluid accumulation and scar tissue. He is engaged in physical therapy to address these symptoms and improve mobility. He denies lymphedema or arm swelling.  Final pathology demonstrated a 1.6 cm tumor with negative margins and seven negative lymph nodes. Oncotype DX recurrence score is 10, indicating low risk of distant recurrence. He has not received chemotherapy.  He expresses concern regarding the risk of lymphedema, particularly if the axilla is included in the radiation field, due to his desire to maintain full arm function.  He has not completed genetic testing for hereditary breast cancer susceptibility despite referral and initial genetic counseling. He denies personal history of thromboembolism and has not experienced significant side effects related to current cancer treatment.      Review of Systems  Constitutional:  Negative for chills, diaphoresis, fever, malaise/fatigue and weight loss.  HENT:  Negative for nosebleeds and sore throat.   Eyes:  Negative for double vision.  Respiratory:  Negative for cough, hemoptysis, sputum production, shortness of breath and wheezing.   Cardiovascular:  Negative for chest pain, palpitations, orthopnea and leg swelling.  Gastrointestinal:  Negative for abdominal pain, blood in stool, constipation, diarrhea, heartburn, melena, nausea and vomiting.  Genitourinary:  Negative for dysuria, frequency and urgency.  Musculoskeletal:  Negative for back pain and joint pain.  Skin: Negative.  Negative for itching and rash.  Neurological:  Negative for dizziness, tingling, focal weakness, weakness and headaches.  Endo/Heme/Allergies:  Does not bruise/bleed easily.  Psychiatric/Behavioral:  Negative for depression. The patient is not nervous/anxious and does not have insomnia.     MEDICAL HISTORY:  Past Medical History:  Diagnosis Date  Anxiety    Aortic  atherosclerosis    Breast cancer in male [facility])    Coronary artery calcification seen on CT scan    Drug-induced erectile dysfunction    Esophagitis    Gout    History of irregular heartbeat    Hypertension     SURGICAL HISTORY: Past Surgical History:  Procedure Laterality Date   AXILLARY SENTINEL NODE BIOPSY Left 01/02/2024   Procedure: BIOPSY, LYMPH NODE, SENTINEL, AXILLARY;  Surgeon: Marinda Jayson KIDD, MD;  Location: [facility] ORS;  Service: General;  Laterality: Left;   BACK SURGERY  1997   BREAST BIOPSY Left 12/22/2023   US  LT BREAST BX W LOC DEV 1ST LESION IMG BX SPEC US  GUIDE 12/22/2023 [facility]   COLONOSCOPY WITH PROPOFOL  N/A 11/09/2021   Procedure: COLONOSCOPY WITH PROPOFOL ;  Surgeon: Therisa Bi, MD;  Location: [facility] ENDOSCOPY;  Service: Gastroenterology;  Laterality: N/A;   KNEE SURGERY  2014   LAPAROSCOPIC APPENDECTOMY N/A 02/02/2018   Procedure: APPENDECTOMY LAPAROSCOPIC;  Surgeon: Rodolph Romano, MD;  Location: [facility] ORS;  Service: General;  Laterality: N/A;   TOTAL MASTECTOMY Left 01/02/2024   Procedure: MASTECTOMY, SIMPLE;  Surgeon: Marinda Jayson KIDD, MD;  Location: [facility] ORS;  Service: General;  Laterality: Left;    SOCIAL HISTORY: Social History   Socioeconomic History   Marital status: Married    Spouse name: Olam   Number of children: Not on file   Years of education: High School   Highest education level: High school graduate  Occupational History   Not on file  Tobacco Use   Smoking status: Former    Types: Cigars    Passive exposure: Past   Smokeless tobacco: Never  Vaping Use   Vaping status: Never Used  Substance and Sexual Activity   Alcohol use: Yes    Alcohol/week: 3.0 standard drinks of alcohol    Types: 3 Standard drinks or equivalent per week    Comment: weekend   Drug use: No   Sexual activity: Not on file  Other Topics Concern   Not on file  Social History Narrative   Not on file   Social Drivers of Health   Tobacco Use:  Medium Risk (02/05/2024)   Patient History    Smoking Tobacco Use: Former    Smokeless Tobacco Use: Never    Passive Exposure: Past  Physicist, Medical Strain: Low Risk (01/28/2024)   Overall Financial Resource Strain (CARDIA)    Difficulty of Paying Living Expenses: Not hard at all  Food Insecurity: No Food Insecurity (01/02/2024)   Epic    Worried About Programme Researcher, Broadcasting/film/video in the Last Year: Never true    Ran Out of Food in the Last Year: Never true  Transportation Needs: No Transportation Needs (01/02/2024)   Epic    Lack of Transportation (Medical): No    Lack of Transportation (Non-Medical): No  Physical Activity: Not on file  Stress: Not on file  Social Connections: Not on file  Intimate Partner Violence: Not At Risk (01/02/2024)   Epic    Fear of Current or Ex-Partner: No    Emotionally Abused: No    Physically Abused: No    Sexually Abused: No  Depression (PHQ2-9): Low Risk (02/06/2024)   Depression (PHQ2-9)    PHQ-2 Score: 0  Alcohol Screen: Low Risk (08/05/2022)   Alcohol Screen    Last Alcohol Screening Score (AUDIT): 2  Housing: Low Risk (01/02/2024)   Epic    Unable to Pay for Housing in the Last  Year: No    Number of Times Moved in the Last Year: 0    Homeless in the Last Year: No  Utilities: Not At Risk (01/02/2024)   Epic    Threatened with loss of utilities: No  Health Literacy: Not on file    FAMILY HISTORY: Family History  Problem Relation Age of Onset   Hyperlipidemia Mother    Leukemia Mother    Stroke Father    Hyperlipidemia Father    Diabetes Father    Heart disease Father    Thyroid disease Sister    Thyroid disease Brother    Lung cancer Brother    Liver cancer Brother    Breast cancer Neg Hx     ALLERGIES:  is allergic to amlodipine and morphine and codeine.  MEDICATIONS:  Current Outpatient Medications  Medication Sig Dispense Refill   allopurinol  (ZYLOPRIM ) 300 MG tablet TAKE 1 AND 1/2 TABLETS(450 MG) BY MOUTH DAILY 135 tablet  3   ibuprofen  (ADVIL ) 800 MG tablet Take 1 tablet (800 mg total) by mouth every 8 (eight) hours as needed. 30 tablet 0   lidocaine -prilocaine  (EMLA ) cream Apply to the areola of left breast and cover with plastic wrap one hour prior to leaving for surgery. 5 g 0   losartan  (COZAAR ) 50 MG tablet Take 1 tablet (50 mg total) by mouth daily. 90 tablet 3   Omega-3 Fatty Acids (FISH OIL PO) Take 2 tablets by mouth every morning.     Spacer/Aero-Holding Chambers (AEROCHAMBER MV) inhaler Use as instructed 1 each 2   tamoxifen  (NOLVADEX ) 20 MG tablet Take 1 tablet (20 mg total) by mouth daily. Start tamoxifen -1 week after radiation. 30 tablet 4   tiZANidine  (ZANAFLEX ) 4 MG tablet Take 1 tablet (4 mg total) by mouth every 8 (eight) hours as needed for muscle spasms. 90 tablet 3   VITAMIN D PO Take 1 tablet by mouth every morning.     No current facility-administered medications for this visit.    PHYSICAL EXAMINATION:   Vitals:   02/06/24 1317  BP: 125/80  Pulse: 77  Temp: (!) 96.9 F (36.1 C)  SpO2: 97%   Filed Weights   02/06/24 1317  Weight: 239 lb (108.4 kg)    Physical Exam Vitals and nursing note reviewed.  HENT:     Head: Normocephalic and atraumatic.     Mouth/Throat:     Pharynx: Oropharynx is clear.  Eyes:     Extraocular Movements: Extraocular movements intact.     Pupils: Pupils are equal, round, and reactive to light.  Cardiovascular:     Rate and Rhythm: Normal rate and regular rhythm.  Pulmonary:     Comments: Decreased breath sounds bilaterally.  Abdominal:     Palpations: Abdomen is soft.  Musculoskeletal:        General: Normal range of motion.     Cervical back: Normal range of motion.  Skin:    General: Skin is warm.  Neurological:     General: No focal deficit present.     Mental Status: He is alert and oriented to person, place, and time.  Psychiatric:        Behavior: Behavior normal.        Judgment: Judgment normal.     LABORATORY DATA:  I  have reviewed the data as listed Lab Results  Component Value Date   WBC 6.3 10/17/2023   HGB 15.9 10/17/2023   HCT 46.7 10/17/2023   MCV 93.8 10/17/2023   PLT  179 10/17/2023   Recent Labs    10/17/23 0916  NA 138  K 4.1  CL 101  CO2 27  GLUCOSE 88  BUN 16  CREATININE 0.65*  CALCIUM 9.3  PROT 7.3  AST 58*  ALT 84*  BILITOT 1.0    RADIOGRAPHIC STUDIES: I have personally reviewed the radiological images as listed and agreed with the findings in the report. No results found.    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 S/p simple mastectomy [Dr.Dennis]-.  pT1c N0 grade 2 anterior margin 3 mm.  Oncotype- LOW RISK. NO role for any adjuvant chemotherapy  # Discussed the role of anti-hormonal therapy mechanism of action; since patient is premenopausal recommend tamoxifen .  I would recommend tamoxifen  for 5 years.  Long discussion regarding the potential adverse events on tamoxifen  including but not limited to hot flashes, mood swings, thromboembolic events strokes.   # Patient will await to start tamoxifen  until after evaluation with radiation Oncology. Discussed with Dr.Chrystal re: radiation re: 3 mm-close margin.  Patient awaiting evaluation with Dr. Camelia at this time.  # Hx of gout  # Family Hx of cancer- brother- lung-smoker. mother with leukemia.  No other family history of malignancies noted.  However given male breast cancer reasonable to proceed with genetic counseling.  Will make a referral to genetics.  # DISPOSITION:  # genetic counselling re: male breast cancer ASAP # follow up in 2 months- MD; no labs- -Dr.B   Above plan of care was discussed with patient/family in detail.  My contact information was given to the  patient/family.     Cindy JONELLE Joe, MD 02/09/2024 2:19 PM

## 2024-02-06 NOTE — Assessment & Plan Note (Addendum)
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   # I had a long discussion with the patient in general regarding the treatment options of breast cancer including-surgery; adjuvant radiation; role of adjuvant systemic therapy including-chemotherapy antihormone therapy.   # Given the early stage disease-and T1 primary-recommend proceeding with upfront surgery and sentinel lymph node biopsy.  Given the characteristics chemotherapy is less likely to be recommended. Patient will benefit from antihormone therapy-like tamoxifen.  I discussed the potential benefits of each option; and also potential downsides in detail.  Patient awaiting evaluation with surgery next week [Dr.Dennis]   # Hx of gout  # Family Hx of cancer- brother- lung-smoker. mother with leukemia.  No other family history of malignancies noted.  However given male breast cancer reasonable to proceed with genetic counseling.  Will make a referral to genetics.  # DISPOSITION:  # genetic counselling re: male breast cancer ASAP # follow up in 2 months- MD; no labs- -Dr.B

## 2024-02-09 NOTE — Therapy (Incomplete)
 " OUTPATIENT PHYSICAL THERAPY UPPER EXTREMITY TREATMENT   Patient Name: Kurt Mendoza MRN: [id number] DOB:1960-10-19, 63 y.o., male Today's Date: 02/09/2024  END OF SESSION:     Past Medical History:  Diagnosis Date   Anxiety    Aortic atherosclerosis    Breast cancer in male [facility])    Coronary artery calcification seen on CT scan    Drug-induced erectile dysfunction    Esophagitis    Gout    History of irregular heartbeat    Hypertension    Past Surgical History:  Procedure Laterality Date   AXILLARY SENTINEL NODE BIOPSY Left 01/02/2024   Procedure: BIOPSY, LYMPH NODE, SENTINEL, AXILLARY;  Surgeon: Marinda Jayson KIDD, MD;  Location: [facility] ORS;  Service: General;  Laterality: Left;   BACK SURGERY  1997   BREAST BIOPSY Left 12/22/2023   US  LT BREAST BX W LOC DEV 1ST LESION IMG BX SPEC US  GUIDE 12/22/2023 [facility]   COLONOSCOPY WITH PROPOFOL  N/A 11/09/2021   Procedure: COLONOSCOPY WITH PROPOFOL ;  Surgeon: Therisa Bi, MD;  Location: [facility] ENDOSCOPY;  Service: Gastroenterology;  Laterality: N/A;   KNEE SURGERY  2014   LAPAROSCOPIC APPENDECTOMY N/A 02/02/2018   Procedure: APPENDECTOMY LAPAROSCOPIC;  Surgeon: Rodolph Romano, MD;  Location: [facility] ORS;  Service: General;  Laterality: N/A;   TOTAL MASTECTOMY Left 01/02/2024   Procedure: MASTECTOMY, SIMPLE;  Surgeon: Marinda Jayson KIDD, MD;  Location: [facility] ORS;  Service: General;  Laterality: Left;   Patient Active Problem List   Diagnosis Date Noted   History of left breast cancer 01/02/2024   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  Rationale for Evaluation and Treatment: Rehabilitation  ONSET DATE: 01/02/2024  SUBJECTIVE:                                                                                                                                                                                      SUBJECTIVE STATEMENT: *** Hand dominance: Right  PERTINENT HISTORY: Patient underwent total mastectomy of L on 01/02/2024. PMH includes anxiety, breast cancer, aortic atherosclerosis, esophagitis, gout, and HTN. Patient's tubing was removed a few weeks  ago. Patient wants to regain full ROM of shoulder.   PAIN:  Are you having pain? Yes: NPRS scale: 4-5/10 Pain location: pain in incision, tweaks and twinges Pain description: tweaks or twinges Aggravating factors: unsure Relieving factors: unsure   PRECAUTIONS: None; no precautions after first 4 weeks, no lifting > 20 lb overhead   RED FLAGS: S/p mastectomy    WEIGHT BEARING RESTRICTIONS: No  FALLS:  Has patient fallen in last 6 months? 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   OBJECTIVE:  Note: Objective measures were completed at Evaluation unless otherwise noted.  DIAGNOSTIC FINDINGS:  N/a  PATIENT SURVEYS :  Quick Dash:  QUICK DASH  Please rate your ability do the following activities in the last week by selecting the number below the appropriate response.   Activities Rating  Open a tight or new jar.  2 = Mild difficulty  Do heavy household  chores (e.g., wash walls, floors). 3 = Moderate difficulty  Carry a shopping bag or briefcase 2 = Mild difficulty  Wash your back. 3 = Moderate difficulty  Use a knife to cut food. 1 = No difficulty   Recreational activities in which you take some force or impact through your arm, shoulder or hand (e.g., golf, hammering, tennis, etc.). 3 = Moderate difficulty  During the past week, to what extent has your arm, shoulder or hand problem interfered with your normal social activities with family, friends, neighbors or groups?  3 = Moderately  During the past week, were you limited in your work or other regular daily activities as a result of your arm, shoulder or hand problem? 3 = Moderately limited  Rate the severity of the following symptoms in the last week: Arm, Shoulder, or hand pain. 3 = Moderate  Rate the severity of the following symptoms in the last week: Tingling (pins and needles) in your arm, shoulder or hand. 1 = none  During the past week, how much difficulty have you had sleeping because of the pain in your arm, shoulder or hand?  3 = Moderate difficulty   (A QuickDASH score may not be calculated if there is greater than 1 missing item.)  Quick Dash Disability/Symptom Score: [(sum of 27 (n) responses/11 (n)] x 25 = 36.36  Minimally Clinically Important Difference (MCID): 15-20 points  (Franchignoni, F. et al. (2013). Minimally clinically important difference of the disabilities of the arm, shoulder, and hand outcome measures (DASH) and its shortened version (Quick DASH). Journal of Orthopaedic & Sports Physical Therapy, 44(1), 30-39)   COGNITION: Overall cognitive status: Within functional limits for tasks assessed     SENSATION: Arms WFL, loss of sensation around scar.   POSTURE: WFL  UPPER EXTREMITY ROM:   Active ROM Right eval Left eval  Shoulder flexion 162 108  Shoulder extension 91 62  Shoulder abduction 156 110 with scapular hike   Shoulder adduction    Shoulder  internal rotation T9 T9  Shoulder external rotation T4 Top of head  Elbow flexion    Elbow extension    Wrist flexion    Wrist extension    Wrist ulnar deviation    Wrist radial deviation    Wrist  pronation    Wrist supination    (Blank rows = not tested)  UPPER EXTREMITY MMT:  MMT Right eval Left eval  Shoulder flexion 5 4+  Shoulder extension 5 4  Shoulder abduction 5 5  Shoulder adduction 4 4  Shoulder internal rotation    Shoulder external rotation    Middle trapezius    Lower trapezius    Elbow flexion    Elbow extension    Wrist flexion    Wrist extension    Wrist ulnar deviation    Wrist radial deviation    Wrist pronation    Wrist supination    Grip strength (lbs)    (Blank rows = not tested)  SHOULDER SPECIAL TESTS: Not appropriate at this time    PALPATION:  Scar Tissue of L pectoral: extensive tissue tension near and on scar,  Axilla region tight and full palpation                                                                                                                              TREATMENT DATE: 02/09/2024 Manual: Scar tissue massage for tension reduction in supine position, extensive soft tissue manipulation and release to L pectoral region and axilla.  X24 minutes   TherEx Dowel flexion AAROM 15x; hold  seconds  supine  Sidelying: abduction 15; x hold 5 seconds  Sidelying ER 15x hold 5 seconds PROM abduction and ER 15x 10 second holds  PATIENT EDUCATION: Education details: goals, POC, HEP Person educated: Patient Education method: Explanation, Demonstration, Tactile cues, Verbal cues, and Handouts Education comprehension: verbalized understanding, returned demonstration, verbal cues required, tactile cues required, and needs further education  HOME EXERCISE PROGRAM: Access Code: [email or web address] URL: [email or web address] Date: 02/03/2024 Prepared by: Khalise Billard  Exercises - Doorway Pec Stretch at 60 Elevation  - 1 x  daily - 7 x weekly - 2 sets - 2 reps - 60 hold - Standing 'L' Stretch at [employer]  - 1 x daily - 7 x weekly - 2 sets - 10 reps - 5 hold - Seated Single Arm Shoulder Abduction and External Rotation AROM  - 1 x daily - 7 x weekly - 2 sets - 10 reps - 5 hold - Seated Scapular Retraction  - 1 x daily - 7 x weekly - 2 sets - 10 reps - 5 hold  ASSESSMENT:  CLINICAL IMPRESSION: *** Patient will benefit from skilled physical therapy to improve ROM, quality of life, and independent function.    OBJECTIVE IMPAIRMENTS: decreased ROM, increased fascial restrictions, impaired flexibility, impaired sensation, impaired UE functional use, improper body mechanics, postural dysfunction, and pain.   ACTIVITY LIMITATIONS: carrying, lifting, dressing, reach over head, and hygiene/grooming  PARTICIPATION LIMITATIONS: meal prep, cleaning, laundry, driving, shopping, community activity, and occupation  PERSONAL FACTORS: Age, Past/current experiences, Time since onset of injury/illness/exacerbation, and 3+ comorbidities: anxiety, breast cancer, aortic atherosclerosis, esophagitis, gout, and HTN. are also affecting patient's functional outcome.   REHAB POTENTIAL:  Good  CLINICAL DECISION MAKING: Evolving/moderate complexity  EVALUATION COMPLEXITY: Moderate  GOALS: Goals reviewed with patient? Yes  SHORT TERM GOALS: Target date: 02/17/2024    Patient will be independent in home exercise program to improve strength/mobility for better functional independence with ADLs. Baseline:12/16: HEP given  Goal status: INITIAL   LONG TERM GOALS: Target date: 03/02/2024   Patient will improve shoulder AROM to > 140 degrees of flexion and abduction for improved ability to perform overhead activities.  Baseline: 12/16: flexion 108, abduction 110 Goal status: INITIAL  2.   Patient will reduce modified Oswestry score to <20 as to demonstrate minimal disability with ADLs including improved sleeping tolerance,  walking/sitting tolerance etc for better mobility with ADLs.  Baseline: 12/16: 36.36% Goal status: INITIAL  3.  Patient will demonstrate adequate shoulder ROM and strength to be able to shave and dress independently with pain less than 3/10. Baseline: Patient has 5/10 pain with donning/doffing clothing Goal status: INITIAL   PLAN: PT FREQUENCY: 2x/week  PT DURATION: 4 weeks  PLANNED INTERVENTIONS: [id number]- PT Re-evaluation, 97750- Physical Performance Testing, 97110-Therapeutic exercises, 97530- Therapeutic activity, [id number]- Neuromuscular re-education, 97535- Self Care, 02859- Manual therapy, [phone]- Canalith repositioning, [id number]- Electrical stimulation (unattended), [phone]- Electrical stimulation (manual), [id number]- Ultrasound, 02987- Traction (mechanical), 20560 (1-2 muscles), 20561 (3+ muscles)- Dry Needling, Patient/Family education, Taping, Joint mobilization, Joint manipulation, Spinal manipulation, Spinal mobilization, Manual lymph drainage, Scar mobilization, Compression bandaging, Vestibular training, Visual/preceptual remediation/compensation, Cognitive remediation, Cryotherapy, Moist heat, and Biofeedback  PLAN FOR NEXT SESSION: scar tissue massage/ release. ROM interventions    Shaneka Efaw, PT 02/09/2024, 10:52 AM  "

## 2024-02-10 ENCOUNTER — Ambulatory Visit

## 2024-02-10 ENCOUNTER — Telehealth: Payer: Self-pay

## 2024-02-10 NOTE — Telephone Encounter (Signed)
 Patient called due to no show. Left VM of time and date of next appointment.   Kurt Mendoza  [name], PT, DPT Physical Therapist - [place] [facility]  Outpatient Physical Therapy- Main Campus [phone]

## 2024-02-11 ENCOUNTER — Inpatient Hospital Stay: Admitting: Internal Medicine

## 2024-02-17 ENCOUNTER — Ambulatory Visit: Admitting: Physical Therapy

## 2024-02-18 ENCOUNTER — Inpatient Hospital Stay: Admitting: Licensed Clinical Social Worker

## 2024-02-18 ENCOUNTER — Inpatient Hospital Stay: Admitting: Occupational Therapy

## 2024-02-18 ENCOUNTER — Inpatient Hospital Stay

## 2024-02-23 ENCOUNTER — Ambulatory Visit: Attending: General Surgery

## 2024-02-23 DIAGNOSIS — M25612 Stiffness of left shoulder, not elsewhere classified: Secondary | ICD-10-CM | POA: Insufficient documentation

## 2024-02-23 DIAGNOSIS — M25512 Pain in left shoulder: Secondary | ICD-10-CM | POA: Insufficient documentation

## 2024-02-23 NOTE — Therapy (Signed)
 " OUTPATIENT PHYSICAL THERAPY UPPER EXTREMITY TREATMENT   Patient Name: Kurt Mendoza MRN: [id number] DOB:05-25-1960, 64 y.o., male Today's Date: 02/23/2024  END OF SESSION:  PT End of Session - 02/23/24 1431     Visit Number 3    Number of Visits 8    Date for Recertification  03/02/24    PT Start Time 1430    PT Stop Time 1515    PT Time Calculation (min) 45 min    Activity Tolerance Patient tolerated treatment well    Behavior During Therapy WFL for tasks assessed/performed            Past Medical History:  Diagnosis Date   Anxiety    Aortic atherosclerosis    Breast cancer in male [facility])    Coronary artery calcification seen on CT scan    Drug-induced erectile dysfunction    Esophagitis    Gout    History of irregular heartbeat    Hypertension    Past Surgical History:  Procedure Laterality Date   AXILLARY SENTINEL NODE BIOPSY Left 01/02/2024   Procedure: BIOPSY, LYMPH NODE, SENTINEL, AXILLARY;  Surgeon: Marinda Jayson KIDD, MD;  Location: [facility] ORS;  Service: General;  Laterality: Left;   BACK SURGERY  1997   BREAST BIOPSY Left 12/22/2023   US  LT BREAST BX W LOC DEV 1ST LESION IMG BX SPEC US  GUIDE 12/22/2023 [facility]   COLONOSCOPY WITH PROPOFOL  N/A 11/09/2021   Procedure: COLONOSCOPY WITH PROPOFOL ;  Surgeon: Therisa Bi, MD;  Location: [facility] ENDOSCOPY;  Service: Gastroenterology;  Laterality: N/A;   KNEE SURGERY  2014   LAPAROSCOPIC APPENDECTOMY N/A 02/02/2018   Procedure: APPENDECTOMY LAPAROSCOPIC;  Surgeon: Rodolph Romano, MD;  Location: [facility] ORS;  Service: General;  Laterality: N/A;   TOTAL MASTECTOMY Left 01/02/2024   Procedure: MASTECTOMY, SIMPLE;  Surgeon: Marinda Jayson KIDD, MD;  Location: [facility] ORS;  Service: General;  Laterality: Left;   Patient Active Problem List   Diagnosis Date Noted   History of left breast cancer 01/02/2024   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, estrogen receptor positive (HCC) 12/26/2023   Anxiety  10/11/2022   Drug-induced erectile dysfunction 10/11/2022   Encounter for screening colonoscopy    Adenomatous polyp of colon    Primary osteoarthritis of both first carpometacarpal joints 09/18/2020   Aortic atherosclerosis 05/09/2020   Chronic neck pain 03/03/2019   Arthritis of both hands 07/07/2018   Gout 04/04/2015   Essential hypertension     PCP: Edman Marsa MART PROVIDER: Marinda Jayson   REFERRING DIAG: Breast Cx, s/p mastectomy  THERAPY DIAG:  Stiffness of left shoulder, not elsewhere classified  Acute pain of left shoulder  Rationale for Evaluation and Treatment: Rehabilitation  ONSET DATE: 01/02/2024  SUBJECTIVE:  SUBJECTIVE STATEMENT: Patient reports the shoulder felt better after last session, was a little stiff the next day.  Patient missed last session. Will see surgeon this week.  Hand dominance: Right  PERTINENT HISTORY: Patient underwent total mastectomy of L on 01/02/2024. PMH includes anxiety, breast cancer, aortic atherosclerosis, esophagitis, gout, and HTN. Patient's tubing was removed a few weeks ago. Patient wants to regain full ROM of shoulder.   PAIN:  Are you having pain? Yes: NPRS scale: 4-5/10 Pain location: pain in incision, tweaks and twinges Pain description: tweaks or twinges Aggravating factors: unsure Relieving factors: unsure   PRECAUTIONS: None; no precautions after first 4 weeks, no lifting > 20 lb overhead   RED FLAGS: S/p mastectomy    WEIGHT BEARING RESTRICTIONS: No  FALLS:  Has patient fallen in last 6 months? 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   OBJECTIVE:  Note: Objective measures were completed at Evaluation unless otherwise noted.  DIAGNOSTIC FINDINGS:  N/a  PATIENT SURVEYS :  Quick Dash:  QUICK DASH  Please rate your ability do the following activities in the last week by selecting the number below the appropriate response.   Activities Rating  Open a tight or new jar.  2 = Mild difficulty  Do heavy household chores (e.g., wash walls, floors). 3 = Moderate difficulty  Carry a shopping bag or briefcase 2 = Mild difficulty  Wash your back. 3 = Moderate difficulty  Use a knife to cut food. 1 = No difficulty   Recreational activities in which you take some force or impact through your arm, shoulder or hand (e.g., golf, hammering, tennis, etc.). 3 = Moderate difficulty  During the past week, to what extent has your arm, shoulder or hand problem interfered with your normal social activities with family, friends, neighbors or groups?  3 = Moderately  During the past week, were you limited in your work or other regular daily activities as a result of your arm, shoulder or hand problem? 3 = Moderately limited  Rate the severity of the following symptoms in the last week: Arm, Shoulder, or hand pain. 3 = Moderate  Rate the severity of the following symptoms in the last week: Tingling (pins and needles) in your arm, shoulder or hand. 1 = none  During the past week, how much difficulty have you had sleeping because of the pain in your arm, shoulder or hand?  3 = Moderate difficulty   (A QuickDASH score may not be calculated if there is greater than 1 missing item.)  Quick Dash Disability/Symptom Score: [(sum of 27 (n) responses/11 (n)] x 25 = 36.36  Minimally Clinically Important Difference (MCID): 15-20 points  (Franchignoni, F. et al. (2013). Minimally clinically important difference of the  disabilities of the arm, shoulder, and hand outcome measures (DASH) and its shortened version (Quick DASH). Journal of Orthopaedic & Sports Physical Therapy, 44(1), 30-39)   COGNITION: Overall cognitive status: Within functional limits for tasks assessed     SENSATION: Arms WFL, loss of sensation around scar.   POSTURE: WFL  UPPER EXTREMITY ROM:   Active ROM Right eval Left eval  Shoulder flexion 162 108  Shoulder extension 91 62  Shoulder abduction 156 110 with scapular hike   Shoulder adduction    Shoulder internal rotation T9 T9  Shoulder external rotation T4 Top of head  Elbow flexion    Elbow extension    Wrist flexion    Wrist extension    Wrist ulnar deviation    Wrist radial deviation    Wrist pronation    Wrist supination    (Blank rows = not tested)  UPPER EXTREMITY MMT:  MMT Right eval Left eval  Shoulder flexion 5 4+  Shoulder extension 5 4  Shoulder abduction 5 5  Shoulder adduction 4 4  Shoulder internal rotation    Shoulder external rotation    Middle trapezius    Lower trapezius    Elbow flexion    Elbow extension    Wrist flexion    Wrist extension    Wrist ulnar deviation    Wrist radial deviation    Wrist pronation    Wrist supination    Grip strength (lbs)    (Blank rows = not tested)  SHOULDER SPECIAL TESTS: Not appropriate at this time    PALPATION:  Scar Tissue of L pectoral: extensive tissue tension near and on scar,  Axilla region tight and full palpation                                                                                                                              TREATMENT DATE: 02/23/2024 Manual: Scar tissue massage for tension reduction in supine position, extensive soft tissue manipulation and release to L pectoral region and axilla.  X16 minutes   TherEx Dowel flexion AAROM 15x; hold  seconds  supine  Dowel chest press 15x  Sidelying: abduction 15; x hold 5 seconds  Sidelying ER 15x hold 5 seconds  (added 3lb DB) for second set  Sleeper stretch 30 seconds  PROM abduction and ER 15x 10 second holds  PATIENT EDUCATION: Education details: goals, POC, HEP Person educated: Patient Education method: Explanation, Demonstration, Tactile cues, Verbal cues, and Handouts Education comprehension: verbalized understanding, returned demonstration, verbal cues required, tactile cues required, and needs further education  HOME EXERCISE PROGRAM: Access Code: [email or web address] URL: [email or web address] Date: 02/03/2024 Prepared by: Sharra Cayabyab  Exercises - Doorway Pec Stretch at 60 Elevation  - 1 x daily - 7 x weekly - 2 sets - 2 reps - 60 hold - Standing 'L' Stretch at [employer]  - 1 x daily - 7 x weekly - 2 sets - 10 reps - 5 hold - Seated Single Arm Shoulder Abduction and External Rotation AROM  - 1 x daily - 7 x weekly - 2 sets - 10 reps - 5 hold - Seated Scapular Retraction  - 1 x daily - 7 x weekly - 2 sets - 10 reps - 5 hold  ASSESSMENT:  CLINICAL IMPRESSION: Patient to see surgeon this week, had good news at oncologist appt. Patient is highly motivated throughout session for increased ROM and  function. Surgical site improving with decreased tension and tissue disruption.  Patient will benefit from skilled physical therapy to improve ROM, quality of life, and independent function.    OBJECTIVE IMPAIRMENTS: decreased ROM, increased fascial restrictions, impaired flexibility, impaired sensation, impaired UE functional use, improper body mechanics, postural dysfunction, and pain.   ACTIVITY LIMITATIONS: carrying, lifting, dressing, reach over head, and hygiene/grooming  PARTICIPATION LIMITATIONS: meal prep, cleaning, laundry, driving, shopping, community activity, and occupation  PERSONAL FACTORS: Age, Past/current experiences, Time since onset of injury/illness/exacerbation, and 3+ comorbidities: anxiety, breast cancer, aortic atherosclerosis, esophagitis, gout, and HTN. are also  affecting patient's functional outcome.   REHAB POTENTIAL: Good  CLINICAL DECISION MAKING: Evolving/moderate complexity  EVALUATION COMPLEXITY: Moderate  GOALS: Goals reviewed with patient? Yes  SHORT TERM GOALS: Target date: 02/17/2024    Patient will be independent in home exercise program to improve strength/mobility for better functional independence with ADLs. Baseline:12/16: HEP given  Goal status: INITIAL   LONG TERM GOALS: Target date: 03/02/2024   Patient will improve shoulder AROM to > 140 degrees of flexion and abduction for improved ability to perform overhead activities.  Baseline: 12/16: flexion 108, abduction 110 Goal status: INITIAL  2.   Patient will reduce modified Oswestry score to <20 as to demonstrate minimal disability with ADLs including improved sleeping tolerance, walking/sitting tolerance etc for better mobility with ADLs.  Baseline: 12/16: 36.36% Goal status: INITIAL  3.  Patient will demonstrate adequate shoulder ROM and strength to be able to shave and dress independently with pain less than 3/10. Baseline: Patient has 5/10 pain with donning/doffing clothing Goal status: INITIAL   PLAN: PT FREQUENCY: 2x/week  PT DURATION: 4 weeks  PLANNED INTERVENTIONS: [id number]- PT Re-evaluation, 97750- Physical Performance Testing, 97110-Therapeutic exercises, 97530- Therapeutic activity, [id number]- Neuromuscular re-education, 97535- Self Care, [id number]- Manual therapy, [phone]- Canalith repositioning, [id number]- Electrical stimulation (unattended), [phone]- Electrical stimulation (manual), [id number]- Ultrasound, [id number]- Traction (mechanical), 20560 (1-2 muscles), 20561 (3+ muscles)- Dry Needling, Patient/Family education, Taping, Joint mobilization, Joint manipulation, Spinal manipulation, Spinal mobilization, Manual lymph drainage, Scar mobilization, Compression bandaging, Vestibular training, Visual/preceptual remediation/compensation, Cognitive remediation, Cryotherapy, Moist  heat, and Biofeedback  PLAN FOR NEXT SESSION: scar tissue massage/ release. ROM interventions    Zachry Hopfensperger, PT 02/23/2024, 3:20 PM  "

## 2024-02-24 ENCOUNTER — Inpatient Hospital Stay: Attending: Internal Medicine

## 2024-02-25 ENCOUNTER — Ambulatory Visit
Admission: RE | Admit: 2024-02-25 | Discharge: 2024-02-25 | Disposition: A | Discharge status: Home / Self Care | Source: Ambulatory Visit | Attending: Radiation Oncology | Admitting: Radiation Oncology

## 2024-02-25 ENCOUNTER — Encounter: Payer: Self-pay | Admitting: Radiation Oncology

## 2024-02-25 VITALS — BP 149/89 | HR 62 | Temp 97.6°F | Resp 15 | Ht 73.0 in | Wt 245.0 lb

## 2024-02-25 DIAGNOSIS — I2510 Atherosclerotic heart disease of native coronary artery without angina pectoris: Secondary | ICD-10-CM | POA: Insufficient documentation

## 2024-02-25 DIAGNOSIS — I700 Atherosclerosis of aorta: Secondary | ICD-10-CM | POA: Diagnosis not present

## 2024-02-25 DIAGNOSIS — I10 Essential (primary) hypertension: Secondary | ICD-10-CM | POA: Diagnosis not present

## 2024-02-25 DIAGNOSIS — Z87891 Personal history of nicotine dependence: Secondary | ICD-10-CM | POA: Insufficient documentation

## 2024-02-25 DIAGNOSIS — Z79899 Other long term (current) drug therapy: Secondary | ICD-10-CM | POA: Insufficient documentation

## 2024-02-25 DIAGNOSIS — Z800 Family history of malignant neoplasm of digestive organs: Secondary | ICD-10-CM | POA: Insufficient documentation

## 2024-02-25 DIAGNOSIS — Z806 Family history of leukemia: Secondary | ICD-10-CM | POA: Insufficient documentation

## 2024-02-25 DIAGNOSIS — Z1721 Progesterone receptor positive status: Secondary | ICD-10-CM | POA: Diagnosis not present

## 2024-02-25 DIAGNOSIS — M109 Gout, unspecified: Secondary | ICD-10-CM | POA: Insufficient documentation

## 2024-02-25 DIAGNOSIS — C50822 Malignant neoplasm of overlapping sites of left male breast: Secondary | ICD-10-CM | POA: Insufficient documentation

## 2024-02-25 DIAGNOSIS — Z801 Family history of malignant neoplasm of trachea, bronchus and lung: Secondary | ICD-10-CM | POA: Diagnosis not present

## 2024-02-25 DIAGNOSIS — Z9012 Acquired absence of left breast and nipple: Secondary | ICD-10-CM | POA: Insufficient documentation

## 2024-02-25 DIAGNOSIS — Z170 Estrogen receptor positive status [ER+]: Secondary | ICD-10-CM | POA: Insufficient documentation

## 2024-02-25 DIAGNOSIS — Z79810 Long term (current) use of selective estrogen receptor modulators (SERMs): Secondary | ICD-10-CM | POA: Diagnosis not present

## 2024-02-25 NOTE — Consult Note (Signed)
 " NEW PATIENT EVALUATION  Name: Kurt Mendoza  MRN: [id number]  Date:   02/25/2024     DOB: 09-14-1960   This 64 y.o. male patient presents to the clinic for initial evaluation of stage Ia (T1c N0 M0) ER positive invasive mammary carcinoma of the left breast status post mastectomy and axillary lymph node dissection.  REFERRING PHYSICIAN: Edman Marsa PARAS, DO  CHIEF COMPLAINT:  Chief Complaint  Patient presents with   Breast Cancer    DIAGNOSIS: The encounter diagnosis was Malignant neoplasm of overlapping sites of left male breast, unspecified estrogen receptor status (HCC).   PREVIOUS INVESTIGATIONS:  Pathology reports reviewed Mammograms reviewed Clinical notes reviewed  HPI: Patient is a 64 year old male who presented with a left breast mass.  Diagnostic mammograms confirmed a 1.5 cm mass in retroareolar region of the left breast for which biopsy was recommended.  Biopsy showed invasive ductal carcinoma overall grade 2 of 3.  Patient underwent mastectomy with sentinel lymph node biopsy showing grade two 1.6 cm invasive mammary carcinoma with DCIS present.  Tumor directly invaded the dermis of the nipple without skin ulceration.  Lymphatic or vascular invasion was present.  Margins were clear but close at 3 mm.  7 sentinel lymph nodes were examined all negative for metastatic disease.  Tumor was strongly ER/PR positive.  Oncotype DX showed recurrence score of 10 and patient will not receive systemic chemotherapy.  He has done well postoperatively.  Specifically denies chest wall tenderness bone pain cough.  He is having no swelling in his left upper extremity at this time.  He is referred to radiation oncology for opinion.  PLANNED TREATMENT REGIMEN: Left chest wall radiation therapy  PAST MEDICAL HISTORY:  has a past medical history of Anxiety, Aortic atherosclerosis, Breast cancer in male [facility]), Coronary artery calcification seen on CT scan, Drug-induced erectile dysfunction,  Esophagitis, Gout, History of irregular heartbeat, and Hypertension.    PAST SURGICAL HISTORY:  Past Surgical History:  Procedure Laterality Date   AXILLARY SENTINEL NODE BIOPSY Left 01/02/2024   Procedure: BIOPSY, LYMPH NODE, SENTINEL, AXILLARY;  Surgeon: Marinda Jayson KIDD, MD;  Location: [facility] ORS;  Service: General;  Laterality: Left;   BACK SURGERY  1997   BREAST BIOPSY Left 12/22/2023   US  LT BREAST BX W LOC DEV 1ST LESION IMG BX SPEC US  GUIDE 12/22/2023 [facility]   COLONOSCOPY WITH PROPOFOL  N/A 11/09/2021   Procedure: COLONOSCOPY WITH PROPOFOL ;  Surgeon: Therisa Bi, MD;  [facility];  Service: Gastroenterology;  Laterality: N/A;   KNEE SURGERY  2014   LAPAROSCOPIC APPENDECTOMY N/A 02/02/2018   Procedure: APPENDECTOMY LAPAROSCOPIC;  Surgeon: Rodolph Romano, MD;  Location: [facility] OR[facility];  Service: General;  Laterality: N/A;   TOTAL MASTECTOMY Left 01/02/2024   Procedure: MASTECTOMY, SIMPLE;  Surgeon: Marinda Jayson KIDD, MD;  Location: [facility] OR[facility];  Service: General;  Laterality: Left;    FAMILY HISTORY: family history includes Diabetes in his father; Heart disease in his father; Hyperlipidemia in his father and mother; Leukemia in his mother; Liver cancer in his brother; Lung cancer in his brother; Stroke in his father; Thyroid disease in his brother and sister.  SOCIAL HISTORY:  reports that he has quit smoking. His smoking use included cigars. He has been exposed to tobacco smoke. He has never used smokeless tobacco. He reports current alcohol use of about 3.0 standard drinks of alcohol per week. He reports that he does not use drugs.  ALLERGIES: Amlodipine and Morphine and codeine  MEDICATIONS:  Current Outpatient Medications  Medication Sig Dispense Refill   allopurinol  (ZYLOPRIM ) 300 MG tablet TAKE 1 AND 1/2 TABLETS(450 MG) BY MOUTH DAILY 135 tablet 3   ibuprofen  (ADVIL ) 800 MG tablet Take 1 tablet (800 mg total) by mouth every 8 (eight) hours as needed. 30 tablet 0    lidocaine -prilocaine  (EMLA ) cream Apply to the areola of left breast and cover with plastic wrap one hour prior to leaving for surgery. 5 g 0   losartan  (COZAAR ) 50 MG tablet Take 1 tablet (50 mg total) by mouth daily. 90 tablet 3   Omega-3 Fatty Acids (FISH OIL PO) Take 2 tablets by mouth every morning.     Spacer/Aero-Holding Chambers (AEROCHAMBER MV) inhaler Use as instructed 1 each 2   tamoxifen  (NOLVADEX ) 20 MG tablet Take 1 tablet (20 mg total) by mouth daily. Start tamoxifen -1 week after radiation. 30 tablet 4   tiZANidine  (ZANAFLEX ) 4 MG tablet Take 1 tablet (4 mg total) by mouth every 8 (eight) hours as needed for muscle spasms. 90 tablet 3   VITAMIN D PO Take 1 tablet by mouth every morning.     No current facility-administered medications for this encounter.    ECOG PERFORMANCE STATUS:  0 - Asymptomatic  REVIEW OF SYSTEMS: Patient denies any weight loss, fatigue, weakness, fever, chills or night sweats. Patient denies any loss of vision, blurred vision. Patient denies any ringing  of the ears or hearing loss. No irregular heartbeat. Patient denies heart murmur or history of fainting. Patient denies any chest pain or pain radiating to her upper extremities. Patient denies any shortness of breath, difficulty breathing at night, cough or hemoptysis. Patient denies any swelling in the lower legs. Patient denies any nausea vomiting, vomiting of blood, or coffee ground material in the vomitus. Patient denies any stomach pain. Patient states has had normal bowel movements no significant constipation or diarrhea. Patient denies any dysuria, hematuria or significant nocturia. Patient denies any problems walking, swelling in the joints or loss of balance. Patient denies any skin changes, loss of hair or loss of weight. Patient denies any excessive worrying or anxiety or significant depression. Patient denies any problems with insomnia. Patient denies excessive thirst, polyuria, polydipsia. Patient  denies any swollen glands, patient denies easy bruising or easy bleeding. Patient denies any recent infections, allergies or URI. Patient s visual fields have not changed significantly in recent time.   PHYSICAL EXAM: BP (!) 149/89   Pulse 62   Temp 97.6 F (36.4 C) (Tympanic)   Resp 15   Ht 6' 1 (1.854 m)   Wt 245 lb (111.1 kg)   BMI 32.32 kg/m  Patient status post left mastectomy with incision healing well no evidence of chest wall mass or nodularities noted.  Right breast is free of dominant mass.  No axillary or supraclavicular adenopathy is identified no evidence of lymphedema in his left upper extremity is noted.  Well-developed well-nourished patient in NAD. HEENT reveals PERLA, EOMI, discs not visualized.  Oral cavity is clear. No oral mucosal lesions are identified. Neck is clear without evidence of cervical or supraclavicular adenopathy. Lungs are clear to A&P. Cardiac examination is essentially unremarkable with regular rate and rhythm without murmur rub or thrill. Abdomen is benign with no organomegaly or masses noted. Motor sensory and DTR levels are equal and symmetric in the upper and lower extremities. Cranial nerves II through XII are grossly intact. Proprioception is intact. No peripheral adenopathy or edema is identified. No motor or sensory levels are noted. Crude visual fields  are within normal range.  LABORATORY DATA: Pathology reports reviewed    RADIOLOGY RESULTS: Mammograms ultrasounds reviewed compatible with above-stated findings   IMPRESSION: Stage Ia ER positive invasive mammary carcinoma of the left breast status post mastectomy and sentinel node biopsy in 64 year old male  PLAN: Based on the close 3 mm margin evidence of lymph-vascular invasion and direct dermal involvement of his tumor would recommend postmastectomy radiation therapy.  Will not treat his peripheral lymphatics based on negative sentinel node biopsies.  Would treat in a hypofractionated course  over 3 weeks boosting the scar another 1000 cGy using electron beam therapy.  Risks and benefits of treatment including skin reaction fatigue alteration of blood counts were reviewed with the patient.  He will have no chance of lymphedema since we will not be treating his nodes.  Will try deep inspiration breath-hold to avoid cardiac toxicity.  Patient and wife comprehend the treatment plan well.  I personally set up and and ordered CT simulation for early next week.  Patient also be candidate for endocrine therapy after completion of radiation.  I would like to take this opportunity to thank you for allowing me to participate in the care of your patient.SABRA Marcey Penton, MD         "

## 2024-02-25 NOTE — Therapy (Incomplete)
 " OUTPATIENT PHYSICAL THERAPY UPPER EXTREMITY TREATMENT   Patient Name: Kurt Mendoza MRN: [id number] DOB:1961-01-24, 64 y.o., male Today's Date: 02/25/2024  END OF SESSION:      Past Medical History:  Diagnosis Date   Anxiety    Aortic atherosclerosis    Breast cancer in male [facility])    Coronary artery calcification seen on CT scan    Drug-induced erectile dysfunction    Esophagitis    Gout    History of irregular heartbeat    Hypertension    Past Surgical History:  Procedure Laterality Date   AXILLARY SENTINEL NODE BIOPSY Left 01/02/2024   Procedure: BIOPSY, LYMPH NODE, SENTINEL, AXILLARY;  Surgeon: Marinda Jayson KIDD, MD;  Location: [facility] ORS;  Service: General;  Laterality: Left;   BACK SURGERY  1997   BREAST BIOPSY Left 12/22/2023   US  LT BREAST BX W LOC DEV 1ST LESION IMG BX SPEC US  GUIDE 12/22/2023 [facility]   COLONOSCOPY WITH PROPOFOL  N/A 11/09/2021   Procedure: COLONOSCOPY WITH PROPOFOL ;  Surgeon: Therisa Bi, MD;  Location: [facility];  Service: Gastroenterology;  Laterality: N/A;   KNEE SURGERY  2014   LAPAROSCOPIC APPENDECTOMY N/A 02/02/2018   Procedure: APPENDECTOMY LAPAROSCOPIC;  Surgeon: Rodolph Romano, MD;  Location: [facility] ORS;  Service: General;  Laterality: N/A;   TOTAL MASTECTOMY Left 01/02/2024   Procedure: MASTECTOMY, SIMPLE;  Surgeon: Marinda Jayson KIDD, MD;  Location: [facility] ORS;  Service: General;  Laterality: Left;   Patient Active Problem List   Diagnosis Date Noted   History of left breast cancer 01/02/2024   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  Rationale for Evaluation and Treatment: Rehabilitation  ONSET DATE: 01/02/2024  SUBJECTIVE:                                                                                                                                                                                      SUBJECTIVE STATEMENT: *** Hand dominance: Right  PERTINENT HISTORY: Patient underwent total mastectomy of L on 01/02/2024. PMH includes anxiety, breast cancer, aortic atherosclerosis, esophagitis, gout, and HTN. Patient's tubing was removed a few  weeks ago. Patient wants to regain full ROM of shoulder.   PAIN:  Are you having pain? Yes: NPRS scale: 4-5/10 Pain location: pain in incision, tweaks and twinges Pain description: tweaks or twinges Aggravating factors: unsure Relieving factors: unsure   PRECAUTIONS: None; no precautions after first 4 weeks, no lifting > 20 lb overhead   RED FLAGS: S/p mastectomy    WEIGHT BEARING RESTRICTIONS: No  FALLS:  Has patient fallen in last 6 months? 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   OBJECTIVE:  Note: Objective measures were completed at Evaluation unless otherwise noted.  DIAGNOSTIC FINDINGS:  N/a  PATIENT SURVEYS :  Quick Dash:  QUICK DASH  Please rate your ability do the following activities in the last week by selecting the number below the appropriate response.   Activities Rating  Open a tight or new jar.  2 = Mild difficulty  Do heavy household  chores (e.g., wash walls, floors). 3 = Moderate difficulty  Carry a shopping bag or briefcase 2 = Mild difficulty  Wash your back. 3 = Moderate difficulty  Use a knife to cut food. 1 = No difficulty   Recreational activities in which you take some force or impact through your arm, shoulder or hand (e.g., golf, hammering, tennis, etc.). 3 = Moderate difficulty  During the past week, to what extent has your arm, shoulder or hand problem interfered with your normal social activities with family, friends, neighbors or groups?  3 = Moderately  During the past week, were you limited in your work or other regular daily activities as a result of your arm, shoulder or hand problem? 3 = Moderately limited  Rate the severity of the following symptoms in the last week: Arm, Shoulder, or hand pain. 3 = Moderate  Rate the severity of the following symptoms in the last week: Tingling (pins and needles) in your arm, shoulder or hand. 1 = none  During the past week, how much difficulty have you had sleeping because of the pain in your arm, shoulder or hand?  3 = Moderate difficulty   (A QuickDASH score may not be calculated if there is greater than 1 missing item.)  Quick Dash Disability/Symptom Score: [(sum of 27 (n) responses/11 (n)] x 25 = 36.36  Minimally Clinically Important Difference (MCID): 15-20 points  (Franchignoni, F. et al. (2013). Minimally clinically important difference of the disabilities of the arm, shoulder, and hand outcome measures (DASH) and its shortened version (Quick DASH). Journal of Orthopaedic & Sports Physical Therapy, 44(1), 30-39)   COGNITION: Overall cognitive status: Within functional limits for tasks assessed     SENSATION: Arms WFL, loss of sensation around scar.   POSTURE: WFL  UPPER EXTREMITY ROM:   Active ROM Right eval Left eval  Shoulder flexion 162 108  Shoulder extension 91 62  Shoulder abduction 156 110 with scapular hike   Shoulder adduction    Shoulder  internal rotation T9 T9  Shoulder external rotation T4 Top of head  Elbow flexion    Elbow extension    Wrist flexion    Wrist extension    Wrist ulnar deviation    Wrist radial deviation  Wrist pronation    Wrist supination    (Blank rows = not tested)  UPPER EXTREMITY MMT:  MMT Right eval Left eval  Shoulder flexion 5 4+  Shoulder extension 5 4  Shoulder abduction 5 5  Shoulder adduction 4 4  Shoulder internal rotation    Shoulder external rotation    Middle trapezius    Lower trapezius    Elbow flexion    Elbow extension    Wrist flexion    Wrist extension    Wrist ulnar deviation    Wrist radial deviation    Wrist pronation    Wrist supination    Grip strength (lbs)    (Blank rows = not tested)  SHOULDER SPECIAL TESTS: Not appropriate at this time    PALPATION:  Scar Tissue of L pectoral: extensive tissue tension near and on scar,  Axilla region tight and full palpation                                                                                                                              TREATMENT DATE: 02/25/2024 Manual: Scar tissue massage for tension reduction in supine position, extensive soft tissue manipulation and release to L pectoral region and axilla.  X16 minutes   TherEx Dowel flexion AAROM 15x; hold  seconds  supine  Dowel chest press 15x  Sidelying: abduction 15; x hold 5 seconds  Sidelying ER 15x hold 5 seconds (added 3lb DB) for second set  Sleeper stretch 30 seconds  PROM abduction and ER 15x 10 second holds  PATIENT EDUCATION: Education details: goals, POC, HEP Person educated: Patient Education method: Explanation, Demonstration, Tactile cues, Verbal cues, and Handouts Education comprehension: verbalized understanding, returned demonstration, verbal cues required, tactile cues required, and needs further education  HOME EXERCISE PROGRAM: Access Code: [email or web address] URL: [email or web address] Date:  02/03/2024 Prepared by: Teresha Hanks  Exercises - Doorway Pec Stretch at 60 Elevation  - 1 x daily - 7 x weekly - 2 sets - 2 reps - 60 hold - Standing 'L' Stretch at [employer]  - 1 x daily - 7 x weekly - 2 sets - 10 reps - 5 hold - Seated Single Arm Shoulder Abduction and External Rotation AROM  - 1 x daily - 7 x weekly - 2 sets - 10 reps - 5 hold - Seated Scapular Retraction  - 1 x daily - 7 x weekly - 2 sets - 10 reps - 5 hold  ASSESSMENT:  CLINICAL IMPRESSION: ***  Patient will benefit from skilled physical therapy to improve ROM, quality of life, and independent function.    OBJECTIVE IMPAIRMENTS: decreased ROM, increased fascial restrictions, impaired flexibility, impaired sensation, impaired UE functional use, improper body mechanics, postural dysfunction, and pain.   ACTIVITY LIMITATIONS: carrying, lifting, dressing, reach over head, and hygiene/grooming  PARTICIPATION LIMITATIONS: meal prep, cleaning, laundry, driving, shopping, community activity, and occupation  PERSONAL FACTORS: Age, Past/current experiences, Time since onset of injury/illness/exacerbation, and 3+ comorbidities:  anxiety, breast cancer, aortic atherosclerosis, esophagitis, gout, and HTN. are also affecting patient's functional outcome.   REHAB POTENTIAL: Good  CLINICAL DECISION MAKING: Evolving/moderate complexity  EVALUATION COMPLEXITY: Moderate  GOALS: Goals reviewed with patient? Yes  SHORT TERM GOALS: Target date: 02/17/2024    Patient will be independent in home exercise program to improve strength/mobility for better functional independence with ADLs. Baseline:12/16: HEP given  Goal status: INITIAL   LONG TERM GOALS: Target date: 03/02/2024   Patient will improve shoulder AROM to > 140 degrees of flexion and abduction for improved ability to perform overhead activities.  Baseline: 12/16: flexion 108, abduction 110 Goal status: INITIAL  2.   Patient will reduce modified Oswestry score to  <20 as to demonstrate minimal disability with ADLs including improved sleeping tolerance, walking/sitting tolerance etc for better mobility with ADLs.  Baseline: 12/16: 36.36% Goal status: INITIAL  3.  Patient will demonstrate adequate shoulder ROM and strength to be able to shave and dress independently with pain less than 3/10. Baseline: Patient has 5/10 pain with donning/doffing clothing Goal status: INITIAL   PLAN: PT FREQUENCY: 2x/week  PT DURATION: 4 weeks  PLANNED INTERVENTIONS: 97164- PT Re-evaluation, 97750- Physical Performance Testing, 97110-Therapeutic exercises, 97530- Therapeutic activity, [id number]- Neuromuscular re-education, 97535- Self Care, 02859- Manual therapy, [phone]- Canalith repositioning, H9716- Electrical stimulation (unattended), [phone]- Electrical stimulation (manual), [id number]- Ultrasound, 02987- Traction (mechanical), 20560 (1-2 muscles), 20561 (3+ muscles)- Dry Needling, Patient/Family education, Taping, Joint mobilization, Joint manipulation, Spinal manipulation, Spinal mobilization, Manual lymph drainage, Scar mobilization, Compression bandaging, Vestibular training, Visual/preceptual remediation/compensation, Cognitive remediation, Cryotherapy, Moist heat, and Biofeedback  PLAN FOR NEXT SESSION: scar tissue massage/ release. ROM interventions    Gracyn Santillanes, PT 02/25/2024, 9:52 AM  "

## 2024-02-26 ENCOUNTER — Encounter: Payer: Self-pay | Admitting: General Surgery

## 2024-02-26 ENCOUNTER — Ambulatory Visit

## 2024-02-26 ENCOUNTER — Ambulatory Visit: Admitting: General Surgery

## 2024-02-26 VITALS — BP 160/89 | HR 71 | Temp 98.4°F | Ht 73.0 in | Wt 243.2 lb

## 2024-02-26 DIAGNOSIS — C50822 Malignant neoplasm of overlapping sites of left male breast: Secondary | ICD-10-CM

## 2024-02-26 DIAGNOSIS — Z853 Personal history of malignant neoplasm of breast: Secondary | ICD-10-CM

## 2024-02-26 NOTE — Patient Instructions (Signed)
 Total or Modified Radical Mastectomy, Care After The following information offers guidance on how to care for yourself after your procedure. Your health care provider may also give you more specific instructions. If you have problems or questions, contact your health care provider. What can I expect after the procedure? After the procedure, it is common to have: Pain, soreness, or tenderness. Numbness. Swelling. Neuropathic (nerve) pain in the chest wall, armpit, or arm. Stiffness in the arm or shoulder. Feelings of stress, sadness, or depression. If the lymph nodes under your arm were removed, you may have arm swelling, weakness, or numbness on the same side of your body as your surgery. Follow these instructions at home: Incision care  Follow instructions from your health care provider about how to take care of your incision. Make sure you: Wash your hands with soap and water for at least 20 seconds before you change your bandage (dressing). If soap and water are not available, use hand sanitizer. Change your dressing as told by your health care provider. Leave stitches (sutures), skin glue, or adhesive strips in place. These skin closures may need to stay in place for 2 weeks or longer. If adhesive strip edges start to loosen and curl up, you may trim the loose edges. Do not remove adhesive strips completely unless your health care provider tells you to do that. Check your incision area every day for signs of infection. Check for: Redness, swelling, or more pain. Fluid or blood. Warmth. Pus or a bad smell. If you were sent home with a surgical drain in place, follow instructions from your health care provider. This may include emptying the drain and keeping track of the amount of drainage. Bathing Do not take baths, swim, or use a hot tub until your health care provider approves. Ask your health care provider if you may take showers. You may only be allowed to take sponge  baths. Activity  Return to your normal activities as told by your health care provider. Ask your health care provider what activities are safe for you. It may take several weeks to return to full activity. Avoid activities that take a lot of effort. Rest as told by your health care provider. Ask friends and family to help with chores, including child care, meal preparation, laundry, and shopping. Be careful to avoid any activities that could cause an injury to your arm on the side of your surgery. Do not lift anything that is heavier than 10 lb (4.5 kg), or the limit that you are told, until your health care provider says that it is safe. Avoid lifting with the arm on the side of your surgery. Do not carry heavy objects on your shoulder. After your drain is removed, do exercises to prevent stiffness and swelling in your arm. Talk with your health care provider about which exercises are safe for you. General instructions Take over-the-counter and prescription medicines only as told by your health care provider. You may eat what you usually do. Keep your arm raised (elevated) above the level of your heart when you are sitting or lying down. Do not wear tight jewelry on your arm, wrist, or fingers on the side of your surgery. You may be given a tight sleeve (compression bandage) to wear over your arm on the side of your surgery. Wear this sleeve as told by your health care provider. Ask your health care provider when you can start wearing a bra or using a breast prosthesis. Before you are involved in  certain procedures, such as giving blood or having your blood pressure checked, tell all of your health care providers if lymph nodes under your arm were removed. This is important information. Follow-up Keep all follow-up visits. This is important. Get checked for extra fluid around your lymph nodes (lymphedema) as often as told by your health care provider. Medicines Take over-the-counter and  prescription medicines as told by your health care provider. Take your antibiotic medicine as told by your health care provider. Do not stop taking the antibiotic even if you start to feel better. Ask your health care provider if the medicine prescribed to you: Requires you to avoid driving or using machinery. Can cause constipation. You may need to take these actions to prevent or treat constipation: Drink enough fluid to keep your urine pale yellow. Take over-the-counter or prescription medicines. Eat foods that are high in fiber, such as beans, whole grains, and fresh fruits and vegetables. Limit foods that are high in fat and processed sugars, such as fried or sweet foods. Lifestyle Do not use any products that contain nicotine or tobacco before the procedure. These products include cigarettes, chewing tobacco, and vaping devices, such as e-cigarettes. These can delay incision healing. If you need help quitting, ask your health care provider. Contact a health care provider if: You have chills or a fever. Your pain medicine is not working. Your arm swelling, weakness, or numbness has not improved after a few weeks. You have new swelling in your breast area or arm. You have redness, swelling, or more pain in your incision area. You have fluid or blood coming from your incision. Your incision feels warm to the touch. You have pus or a bad smell coming from your incision. Get help right away if: You have very bad pain in your breast area or arm. You have a sudden onset of chest pain. You have a fast heart rate. You have difficulty breathing. These symptoms may be an emergency. Get help right away. Call 911. Do not wait to see if the symptoms will go away. Do not drive yourself to the hospital. Summary Follow instructions from your health care provider about how to take care of your incision. Check your incision area every day for signs of infection. Ask your health care provider what  activities are safe for you. Keep all follow-up visits. This is important. Contact a health care provider if you have new swelling in your breast area or arm. This information is not intended to replace advice given to you by your health care provider. Make sure you discuss any questions you have with your health care provider. Document Revised: 11/05/2020 Document Reviewed: 11/05/2020 Elsevier Patient Education  2024 [employer].

## 2024-03-01 ENCOUNTER — Telehealth: Payer: Self-pay | Admitting: Internal Medicine

## 2024-03-01 NOTE — Telephone Encounter (Signed)
 Spoke to patient regarding his reluctance with radiation-I think it is reasonable to hold off the radiation given negative margins even though within 3 mm.  Also discussed with Dr. Marinda.   Patient started on tamoxifen .  Will follow-up as planned   GB

## 2024-03-02 ENCOUNTER — Ambulatory Visit

## 2024-03-02 ENCOUNTER — Encounter: Payer: Self-pay | Admitting: *Deleted

## 2024-03-03 ENCOUNTER — Inpatient Hospital Stay

## 2024-03-03 ENCOUNTER — Inpatient Hospital Stay: Admitting: Licensed Clinical Social Worker

## 2024-03-03 ENCOUNTER — Telehealth: Payer: Self-pay | Admitting: Internal Medicine

## 2024-03-03 ENCOUNTER — Inpatient Hospital Stay: Attending: Internal Medicine | Admitting: Occupational Therapy

## 2024-03-03 NOTE — Telephone Encounter (Signed)
 Patient called to reschedule appointments for [facility] and genetics from today.  Appointments rescheduled as requested

## 2024-03-04 ENCOUNTER — Ambulatory Visit

## 2024-03-04 ENCOUNTER — Telehealth: Payer: Self-pay

## 2024-03-04 NOTE — Telephone Encounter (Signed)
 Pt did not arrive for scheduled appointment. No telephone call nor message preceeded this absence. Author attempted to contact pt via telephone number listed in chart. Author left a HIPAA compliant VM for patient to contact clinic.   4:09 PM, 03/04/24 Peggye JAYSON Linear, PT, DPT Physical Therapist - [facility]  Outpatient Physical Therapy- Main Campus [phone]

## 2024-03-08 NOTE — Progress Notes (Signed)
 Outpatient Surgical Follow Up  03/08/2024  Kurt Mendoza is an 64 y.o. male.   Chief Complaint  Patient presents with   Routine Post Op    Left breast mastectomy 01/03/2024    HPI: Mr. Kurt Mendoza returns today status post left breast mastectomy with sentinel lymph node biopsy.  His final pathology was consistent with T1 N0 invasive ductal carcinoma with all margins negative.  He did have a close posterior margin that was 3 mm.  Since the last time he saw me he has started physical therapy and feels like his range of motion is improving.  He still has some tightness around his incision but that is getting better.  He denies any lymphedema or pain.  He does see a that there is still little swelling up towards his axilla.  He talked with his oncologist and there was discussion of whether he would require radiation therapy.  He actually met with the radiation oncologist and he is unsure whether he wants to proceed with radiation for the close margin.  Past Medical History:  Diagnosis Date   Anxiety    Aortic atherosclerosis    Breast cancer in male [facility])    Coronary artery calcification seen on CT scan    Drug-induced erectile dysfunction    Esophagitis    Gout    History of irregular heartbeat    Hypertension     Past Surgical History:  Procedure Laterality Date   AXILLARY SENTINEL NODE BIOPSY Left 01/02/2024   Procedure: BIOPSY, LYMPH NODE, SENTINEL, AXILLARY;  Surgeon: Marinda Jayson KIDD, MD;  Location: [facility] ORS;  Service: General;  Laterality: Left;   BACK SURGERY  1997   BREAST BIOPSY Left 12/22/2023   US  LT BREAST BX W LOC DEV 1ST LESION IMG BX SPEC US  GUIDE 12/22/2023 [facility]   COLONOSCOPY WITH PROPOFOL  N/A 11/09/2021   Procedure: COLONOSCOPY WITH PROPOFOL ;  Surgeon: Therisa Bi, MD;  Location: [facility] ENDOSCOPY;  Service: Gastroenterology;  Laterality: N/A;   KNEE SURGERY  2014   LAPAROSCOPIC APPENDECTOMY N/A 02/02/2018   Procedure: APPENDECTOMY LAPAROSCOPIC;  Surgeon:  Rodolph Romano, MD;  Location: [facility] ORS;  Service: General;  Laterality: N/A;   TOTAL MASTECTOMY Left 01/02/2024   Procedure: MASTECTOMY, SIMPLE;  Surgeon: Marinda Jayson KIDD, MD;  Location: [facility] ORS;  Service: General;  Laterality: Left;    Family History  Problem Relation Age of Onset   Hyperlipidemia Mother    Leukemia Mother    Stroke Father    Hyperlipidemia Father    Diabetes Father    Heart disease Father    Thyroid disease Sister    Thyroid disease Brother    Lung cancer Brother    Liver cancer Brother    Breast cancer Neg Hx     Social History:  reports that he has quit smoking. His smoking use included cigars. He has been exposed to tobacco smoke. He has never used smokeless tobacco. He reports current alcohol use of about 3.0 standard drinks of alcohol per week. He reports that he does not use drugs.  Allergies: Allergies[1]  Medications reviewed.    ROS Full ROS performed and is otherwise negative other than what is stated in HPI   BP (!) 160/89   Pulse 71   Temp 98.4 F (36.9 C) (Oral)   Ht 6' 1 (1.854 m)   Wt 243 lb 3.2 oz (110.3 kg)   SpO2 96%   BMI 32.09 kg/m   Physical Exam  Left breast scar has healed well.  There is some tightness around the incision.  There is also some fullness in the lateral part of the incision towards the axilla.  There continues to be a little bit of induration tissue along the length of the incision.  There is no erythema.  He is able to reach his left arm over his head but there is some limitations in range of motion.   We reviewed pathology with the patient. Assessment/Plan:  Patient status post left mastectomy with sentinel lymph node biopsy.  His closest margin from DCIS is 3 mm.  He did go discuss the role of radiation with the our radiation oncologist.  I had a long discussion with the patient today about the treatment options he has.  Overall there is not strong evidence to suggest that radiation is necessary.   I discussed with him that radiation also does not come without side effects.  We reviewed the NCCN guidelines and it shows that radiation should be considered for his case.  After talking with him I think that he is not 100% comfortable with pursuing radiation and I think that is acceptable to forego this.  I also discussed this after his visit with his oncologist and we are all in agreement that if the patient does not want to pursue radiation and that is at fine treatment option.  I will plan to see him again in 6 months.  A total of 32 minutes was spent reviewing the patient's chart, performing history and physical and discussing treatment options with the patient  Jayson Endow, M.D. [facility]     [1]  Allergies Allergen Reactions   Amlodipine Swelling   Morphine And Codeine Itching

## 2024-03-09 ENCOUNTER — Inpatient Hospital Stay: Attending: Internal Medicine | Admitting: Licensed Clinical Social Worker

## 2024-03-09 ENCOUNTER — Inpatient Hospital Stay

## 2024-03-09 ENCOUNTER — Ambulatory Visit

## 2024-03-09 ENCOUNTER — Telehealth: Payer: Self-pay

## 2024-03-09 NOTE — Telephone Encounter (Signed)
 Patient called due to no show. Left voicemail with time and date of next appt time.   Kurt Mendoza  [name], PT, DPT Physical Therapist - [facility]  Outpatient Physical Therapy- Main Campus [phone]

## 2024-03-11 ENCOUNTER — Ambulatory Visit

## 2024-03-11 ENCOUNTER — Telehealth: Payer: Self-pay

## 2024-03-11 NOTE — Telephone Encounter (Signed)
 Patient no showed another physical therapy appointment. Due to frequency of no show patient called to discuss need to schedule one appt at a time however patient voicemail is full and therapist is unable to leave voicemail.  Kurt Mendoza  [name], PT, DPT Physical Therapist - [facility]  Outpatient Physical Therapy- Main Campus [phone]

## 2024-03-16 ENCOUNTER — Ambulatory Visit

## 2024-03-17 ENCOUNTER — Inpatient Hospital Stay: Admitting: Occupational Therapy

## 2024-03-17 DIAGNOSIS — Z801 Family history of malignant neoplasm of trachea, bronchus and lung: Secondary | ICD-10-CM | POA: Insufficient documentation

## 2024-03-17 DIAGNOSIS — C50822 Malignant neoplasm of overlapping sites of left male breast: Secondary | ICD-10-CM | POA: Insufficient documentation

## 2024-03-17 DIAGNOSIS — Z170 Estrogen receptor positive status [ER+]: Secondary | ICD-10-CM | POA: Insufficient documentation

## 2024-03-18 ENCOUNTER — Other Ambulatory Visit: Payer: Self-pay | Admitting: Licensed Clinical Social Worker

## 2024-03-18 ENCOUNTER — Inpatient Hospital Stay: Attending: Internal Medicine

## 2024-03-18 ENCOUNTER — Inpatient Hospital Stay: Admitting: Licensed Clinical Social Worker

## 2024-03-18 ENCOUNTER — Ambulatory Visit

## 2024-03-18 DIAGNOSIS — Z170 Estrogen receptor positive status [ER+]: Secondary | ICD-10-CM | POA: Diagnosis not present

## 2024-03-18 DIAGNOSIS — Z801 Family history of malignant neoplasm of trachea, bronchus and lung: Secondary | ICD-10-CM

## 2024-03-18 DIAGNOSIS — Z1379 Encounter for other screening for genetic and chromosomal anomalies: Secondary | ICD-10-CM

## 2024-03-18 DIAGNOSIS — C50822 Malignant neoplasm of overlapping sites of left male breast: Secondary | ICD-10-CM | POA: Diagnosis present

## 2024-03-18 LAB — GENETIC SCREENING ORDER

## 2024-03-18 NOTE — Progress Notes (Signed)
 REFERRING PROVIDER: Rennie Cindy SAUNDERS, MD [address] [place],  [place] [address]  PRIMARY PROVIDER:  Edman Marsa PARAS, DO  PRIMARY REASON FOR VISIT:  1. 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 Family history of lung cancer      HISTORY OF PRESENT ILLNESS:   Kurt Mendoza, a 64 y.o. male, was seen for a [facility] cancer genetics consultation at the request of Dr. Rennie due to a personal and family history of cancer.  Mr. Dacanay presents to clinic today to discuss the possibility of a hereditary predisposition to cancer, genetic testing, and to further clarify his future cancer risks, as well as potential cancer risks for family members.   CANCER HISTORY:  Oncology History Overview Note  pT Category: pT1c  pN Category: pN0  pM Category: Not applicable   SPECIAL STUDIES  Breast Biomarker Testing Performed on Previous Biopsy: [id number]  Estrogen Receptor: Positive (95%, strong)  Progesterone Receptor: Positive (100%, strong)  Proliferation Marker Ki67:  15%  HER2 IHC: Negative (1+   # DEC 2025-Oncotype low risk-no adjuvant chemotherapy.  3 mm anterior/inferior margin- ?  Role for radiation.  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 Friedl was diagnosed with left breast cancer. This was treated with mastectomy.   RELEVANT MEDICAL HISTORY:  He has had colonoscopies in the past, less than 10  total polyps. Reports normal PSA.    Past Medical History:  Diagnosis Date   Anxiety    Aortic atherosclerosis    Breast cancer in male [facility])    Coronary artery calcification seen on CT scan    Drug-induced erectile dysfunction    Esophagitis    Gout    History of irregular heartbeat    Hypertension     Past Surgical History:  Procedure Laterality Date   AXILLARY SENTINEL NODE BIOPSY Left 01/02/2024   Procedure: BIOPSY, LYMPH NODE, SENTINEL, AXILLARY;  Surgeon: Marinda Jayson KIDD, MD;  Location: [facility] ORS;  Service: General;  Laterality: Left;   BACK SURGERY  1997   BREAST BIOPSY Left 12/22/2023   US  LT BREAST BX W LOC DEV 1ST LESION IMG BX SPEC US  GUIDE 12/22/2023 [facility]   COLONOSCOPY WITH PROPOFOL  N/A 11/09/2021   Procedure: COLONOSCOPY WITH PROPOFOL ;  Surgeon: Therisa Bi, MD;  Location: [facility];  Service: Gastroenterology;  Laterality: N/A;   KNEE SURGERY  2014   LAPAROSCOPIC APPENDECTOMY N/A 02/02/2018   Procedure: APPENDECTOMY LAPAROSCOPIC;  Surgeon: Rodolph Romano, MD;  Location: [facility] ORS;  Service: General;  Laterality: N/A;   TOTAL MASTECTOMY Left 01/02/2024   Procedure: MASTECTOMY, SIMPLE;  Surgeon: Marinda Jayson KIDD, MD;  Location: [facility] ORS;  Service: General;  Laterality: Left;    FAMILY HISTORY:  We obtained a detailed, 4-generation family history.  Significant diagnoses are listed below: Family History  Problem Relation Age of Onset   Hyperlipidemia Mother    Leukemia Mother    Stroke Father    Hyperlipidemia  Father    Diabetes Father    Heart disease Father    Thyroid disease Sister    Thyroid disease Brother    Lung cancer Brother    Liver cancer Brother    Breast cancer Neg Hx      Brother- lung cancer, passed last year at 8 Mother - chronic leukemia  Mr. Lewman is unaware of previous family history of genetic testing for hereditary cancer risks. There is no reported Ashkenazi Jewish ancestry. There is no known  consanguinity.  GENETIC COUNSELING ASSESSMENT: Mr. Njoku is a 64 y.o. male with a personal history of breast cancer which is somewhat suggestive of a hereditary cancer syndrome and predisposition to cancer. We, therefore, discussed and recommended the following at today's visit.   DISCUSSION: We discussed that, in general, most cancer is not inherited in families, but instead is sporadic or familial.  We discussed that approximately 10% of breast cancer is hereditary, up to 40% of male breast cancer can be hereditary. Most cases of hereditary male breast cancer are associated with BRCA2 gene, although there are other genes associated with hereditary breast cancer as well. Cancers and risks are gene specific. We discussed that testing is beneficial for several reasons including knowing about cancer risks, identifying potential screening and risk-reduction options that may be appropriate, and to understand if other family members could be at risk for cancer and allow them to undergo genetic testing.   We reviewed the characteristics, features and inheritance patterns of hereditary cancer syndromes. We also discussed genetic testing, including the appropriate family members to test, the process of testing, insurance coverage and turn-around-time for results. We discussed the implications of a negative, positive and/or variant of uncertain significant result. We recommended Mr. Minahan pursue genetic testing for the [facility] CancerNext+RNA gene panel.   The [facility] CancerNext+RNAinsight Panel includes sequencing, rearrangement analysis, and RNA analysis for the following 40 genes: APC, ATM, BAP1, BARD1, BMPR1A, BRCA1, BRCA2, BRIP1, CDH1, CDKN2A, CHEK2, FH, FLCN, MET, MLH1, MSH2, MSH6, MUTYH, NF1, NTHL1, PALB2, PMS2, PTEN, RAD51C, RAD51D, RPS20, SMAD4, STK11, TP53, TSC1, TSC2, and VHL (sequencing and deletion/duplication); AXIN2, HOXB13, MBD4, MSH3, POLD1 and POLE (sequencing only); EPCAM and GREM1 (deletion/duplication  only).  Based on Mr. Mcclure personal and family history of cancer, he meets medical criteria for genetic testing. Despite that he meets criteria, he may still have an out of pocket cost.   PLAN: After considering the risks, benefits, and limitations, Mr. Gonzaga provided informed consent to pursue genetic testing and the blood sample was sent to [facility] for analysis of the CancerNext+RNA Panel. Results should be available within approximately 2-3 weeks' time, at which point they will be disclosed by telephone to Mr. Mctavish, as will any additional recommendations warranted by these results. Mr. Malter will receive a summary of his genetic counseling visit and a copy of his results once available. This information will also be available in Epic.    Mr. Bruntz questions were answered to his satisfaction today. Our contact information was provided should additional questions or concerns arise. Thank you for the referral and allowing us  to share in the care of your patient.   Dena Cary, MS, [facility] Genetic Counselor [email or web address] Phone: [phone]  I personally spent a total of 40 minutes in the care of the patient today including getting/reviewing separately obtained history, counseling and educating, placing orders, and documenting clinical information in the EHR.  Dr. Delinda was available for discussion regarding this case.   _______________________________________________________________________ For Office Staff:  Number of people involved in session: 1 Was an Intern/ student involved with case: no

## 2024-03-23 ENCOUNTER — Ambulatory Visit

## 2024-04-08 ENCOUNTER — Inpatient Hospital Stay: Admitting: Internal Medicine

## 2024-10-22 ENCOUNTER — Other Ambulatory Visit

## 2024-11-05 ENCOUNTER — Encounter: Admitting: Family Medicine
